# Patient Record
Sex: Female | Born: 1960
Health system: Southern US, Community
[De-identification: ages and names within clinical notes are randomized; demographics above are authoritative.]

## PROBLEM LIST (undated history)

## (undated) DIAGNOSIS — Z720 Tobacco use: Secondary | ICD-10-CM

## (undated) DIAGNOSIS — T7840XA Allergy, unspecified, initial encounter: Secondary | ICD-10-CM

## (undated) DIAGNOSIS — G459 Transient cerebral ischemic attack, unspecified: Secondary | ICD-10-CM

## (undated) DIAGNOSIS — E785 Hyperlipidemia, unspecified: Secondary | ICD-10-CM

## (undated) DIAGNOSIS — M1990 Unspecified osteoarthritis, unspecified site: Secondary | ICD-10-CM

## (undated) DIAGNOSIS — I150 Renovascular hypertension: Secondary | ICD-10-CM

## (undated) DIAGNOSIS — J219 Acute bronchiolitis, unspecified: Secondary | ICD-10-CM

## (undated) DIAGNOSIS — I701 Atherosclerosis of renal artery: Secondary | ICD-10-CM

## (undated) HISTORY — DX: Hyperlipidemia, unspecified: E78.5

## (undated) HISTORY — PX: OTHER SURGICAL HISTORY: SHX169

## (undated) HISTORY — DX: Unspecified osteoarthritis, unspecified site: M19.90

## (undated) HISTORY — DX: Atherosclerosis of renal artery: I70.1

## (undated) HISTORY — DX: Acute bronchiolitis, unspecified: J21.9

## (undated) HISTORY — DX: Tobacco use: Z72.0

## (undated) HISTORY — DX: Transient cerebral ischemic attack, unspecified: G45.9

## (undated) HISTORY — PX: COLONOSCOPY: SHX174

## (undated) HISTORY — PX: SHOULDER SURGERY: SHX246

## (undated) HISTORY — PX: CARPAL TUNNEL RELEASE: SHX101

## (undated) HISTORY — PX: HAND SURGERY: SHX662

## (undated) HISTORY — PX: KNEE SURGERY: SHX244

## (undated) HISTORY — DX: Allergy, unspecified, initial encounter: T78.40XA

## (undated) HISTORY — DX: Renovascular hypertension: I15.0

## (undated) HISTORY — PX: FL INJ LEFT KNEE CT ARTHROGRAM (ARMC HX): HXRAD1307

## (undated) HISTORY — PX: POLYPECTOMY: SHX149

---

## 1998-07-25 ENCOUNTER — Ambulatory Visit (HOSPITAL_BASED_OUTPATIENT_CLINIC_OR_DEPARTMENT_OTHER): Admission: RE | Admit: 1998-07-25 | Discharge: 1998-07-25 | Payer: Self-pay | Admitting: Orthopedic Surgery

## 1998-09-03 ENCOUNTER — Other Ambulatory Visit: Admission: RE | Admit: 1998-09-03 | Discharge: 1998-09-03 | Payer: Self-pay | Admitting: Obstetrics & Gynecology

## 1999-12-12 ENCOUNTER — Other Ambulatory Visit: Admission: RE | Admit: 1999-12-12 | Discharge: 1999-12-12 | Payer: Self-pay | Admitting: Obstetrics & Gynecology

## 2000-12-13 ENCOUNTER — Other Ambulatory Visit: Admission: RE | Admit: 2000-12-13 | Discharge: 2000-12-13 | Payer: Self-pay | Admitting: Obstetrics & Gynecology

## 2002-01-10 ENCOUNTER — Other Ambulatory Visit: Admission: RE | Admit: 2002-01-10 | Discharge: 2002-01-10 | Payer: Self-pay | Admitting: Obstetrics & Gynecology

## 2002-01-28 ENCOUNTER — Emergency Department (HOSPITAL_COMMUNITY): Admission: EM | Admit: 2002-01-28 | Discharge: 2002-01-28 | Payer: Self-pay | Admitting: *Deleted

## 2002-01-28 ENCOUNTER — Encounter: Payer: Self-pay | Admitting: Emergency Medicine

## 2003-02-05 ENCOUNTER — Other Ambulatory Visit: Admission: RE | Admit: 2003-02-05 | Discharge: 2003-02-05 | Payer: Self-pay | Admitting: Obstetrics & Gynecology

## 2004-03-25 ENCOUNTER — Other Ambulatory Visit: Admission: RE | Admit: 2004-03-25 | Discharge: 2004-03-25 | Payer: Self-pay | Admitting: Obstetrics & Gynecology

## 2004-05-13 ENCOUNTER — Encounter: Admission: RE | Admit: 2004-05-13 | Discharge: 2004-05-13 | Payer: Self-pay | Admitting: Internal Medicine

## 2004-10-31 ENCOUNTER — Encounter (INDEPENDENT_AMBULATORY_CARE_PROVIDER_SITE_OTHER): Payer: Self-pay | Admitting: *Deleted

## 2004-10-31 ENCOUNTER — Ambulatory Visit (HOSPITAL_COMMUNITY): Admission: RE | Admit: 2004-10-31 | Discharge: 2004-10-31 | Payer: Self-pay | Admitting: *Deleted

## 2004-10-31 ENCOUNTER — Encounter (INDEPENDENT_AMBULATORY_CARE_PROVIDER_SITE_OTHER): Payer: Self-pay | Admitting: Specialist

## 2005-04-08 ENCOUNTER — Other Ambulatory Visit: Admission: RE | Admit: 2005-04-08 | Discharge: 2005-04-08 | Payer: Self-pay | Admitting: Obstetrics & Gynecology

## 2005-06-19 ENCOUNTER — Encounter: Admission: RE | Admit: 2005-06-19 | Discharge: 2005-06-19 | Payer: Self-pay | Admitting: Internal Medicine

## 2007-09-02 ENCOUNTER — Ambulatory Visit (HOSPITAL_COMMUNITY): Admission: RE | Admit: 2007-09-02 | Discharge: 2007-09-02 | Payer: Self-pay | Admitting: *Deleted

## 2007-09-02 ENCOUNTER — Encounter (INDEPENDENT_AMBULATORY_CARE_PROVIDER_SITE_OTHER): Payer: Self-pay | Admitting: *Deleted

## 2008-12-04 ENCOUNTER — Encounter: Admission: RE | Admit: 2008-12-04 | Discharge: 2008-12-04 | Payer: Self-pay | Admitting: Internal Medicine

## 2009-11-04 ENCOUNTER — Encounter (INDEPENDENT_AMBULATORY_CARE_PROVIDER_SITE_OTHER): Payer: Self-pay | Admitting: *Deleted

## 2009-11-06 ENCOUNTER — Encounter: Admission: RE | Admit: 2009-11-06 | Discharge: 2009-11-06 | Payer: Self-pay | Admitting: Obstetrics & Gynecology

## 2009-12-23 ENCOUNTER — Ambulatory Visit: Payer: Self-pay | Admitting: Gastroenterology

## 2009-12-23 DIAGNOSIS — Z860100 Personal history of colon polyps, unspecified: Secondary | ICD-10-CM | POA: Insufficient documentation

## 2009-12-23 DIAGNOSIS — R198 Other specified symptoms and signs involving the digestive system and abdomen: Secondary | ICD-10-CM

## 2009-12-30 ENCOUNTER — Ambulatory Visit: Payer: Self-pay | Admitting: Gastroenterology

## 2010-01-02 ENCOUNTER — Encounter: Payer: Self-pay | Admitting: Gastroenterology

## 2010-04-13 ENCOUNTER — Encounter: Payer: Self-pay | Admitting: Obstetrics & Gynecology

## 2010-04-22 NOTE — Letter (Signed)
Summary: Results Letter  [facility]  [address] [place], [place] [address]   Phone: [phone]  Fax: [phone]        December 23, 2009 MRN: [id number]    ANNIS LAGOY [address] [place], [place]  [address]    Dear Ms. Mejorado,  It is my pleasure to have treated you recently as a new patient in my office. I appreciate your confidence and the opportunity to participate in your care.  Since I do have a busy inpatient endoscopy schedule and office schedule, my office hours vary weekly. I am, however, available for emergency calls everyday through my office. If I am not available for an urgent office appointment, another one of our gastroenterologist will be able to assist you.  My well-trained staff are prepared to help you at all times. For emergencies after office hours, a physician from our Gastroenterology section is always available through my 24 hour answering service  Once again I welcome you as a new patient and I look forward to a happy and healthy relationship             Sincerely,  Louis Meckel MD  This letter has been electronically signed by your physician.  Appended Document: Results Letter letter mailed

## 2010-04-22 NOTE — Letter (Signed)
Summary: [facility] Instructions  [facility]  [address] [name], [place] [address]   Phone: [phone]  Fax: [phone]       Sheri Perkins    [birth year]    MRN: [id number]        Procedure Day /Date:MONDAY 12/30/2009     Arrival Time:12:30PM     Procedure Time:1:30PM     Location of Procedure:                    X   [facility] (4th Floor)   PREPARATION FOR COLONOSCOPY WITH MOVIPREP   Starting 5 days prior to your procedure 10/5/2011do not eat nuts, seeds, popcorn, corn, beans, peas,  salads, or any raw vegetables.  Do not take any fiber supplements (e.g. Metamucil, Citrucel, and Benefiber).  THE DAY BEFORE YOUR PROCEDURE         DATE: 12/29/2009 DAY: SUNDAY  1.  Drink clear liquids the entire day-NO SOLID FOOD  2.  Do not drink anything colored red or purple.  Avoid juices with pulp.  No orange juice.  3.  Drink at least 64 oz. (8 glasses) of fluid/clear liquids during the day to prevent dehydration and help the prep work efficiently.  CLEAR LIQUIDS INCLUDE: Water Jello Ice Popsicles Tea (sugar ok, no milk/cream) Powdered fruit flavored drinks Coffee (sugar ok, no milk/cream) Gatorade Juice: apple, white grape, white cranberry  Lemonade Clear bullion, consomm, broth Carbonated beverages (any kind) Strained chicken noodle soup Hard Candy                             4.  In the morning, mix first dose of MoviPrep solution:    Empty 1 Pouch A and 1 Pouch B into the disposable container    Add lukewarm drinking water to the top line of the container. Mix to dissolve    Refrigerate (mixed solution should be used within 24 hrs)  5.  Begin drinking the prep at 5:00 p.m. The MoviPrep container is divided by 4 marks.   Every 15 minutes drink the solution down to the next mark (approximately 8 oz) until the full liter is complete.   6.  Follow completed prep with 16 oz of clear liquid of your choice (Nothing red or purple).   Continue to drink clear liquids until bedtime.  7.  Before going to bed, mix second dose of MoviPrep solution:    Empty 1 Pouch A and 1 Pouch B into the disposable container    Add lukewarm drinking water to the top line of the container. Mix to dissolve    Refrigerate  THE DAY OF YOUR PROCEDURE      DATE: 12/30/2009 DAY: MONDAY  Beginning at 8:30AMa.m. (5 hours before procedure):         1. Every 15 minutes, drink the solution down to the next mark (approx 8 oz) until the full liter is complete.  2. Follow completed prep with 16 oz. of clear liquid of your choice.    3. You may drink clear liquids until 11:30AM (2 HOURS BEFORE PROCEDURE).   MEDICATION INSTRUCTIONS  Unless otherwise instructed, you should take regular prescription medications with a small sip of water   as early as possible the morning of your procedure.          OTHER INSTRUCTIONS  You will need a responsible adult at least 52 years of age to accompany you and  drive you home.   This person must remain in the waiting room during your procedure.  Wear loose fitting clothing that is easily removed.  Leave jewelry and other valuables at home.  However, you may wish to bring a book to read or  an iPod/MP3 player to listen to music as you wait for your procedure to start.  Remove all body piercing jewelry and leave at home.  Total time from sign-in until discharge is approximately 2-3 hours.  You should go home directly after your procedure and rest.  You can resume normal activities the  day after your procedure.  The day of your procedure you should not:   Drive   Make legal decisions   Operate machinery   Drink alcohol   Return to work  You will receive specific instructions about eating, activities and medications before you leave.    The above instructions have been reviewed and explained to me by   _______________________    I fully understand and can verbalize these instructions  _____________________________ Date _________

## 2010-04-22 NOTE — Procedures (Signed)
Summary: Colonoscopy  Patient: Sheri Perkins Note: All result statuses are Final unless otherwise noted.  Tests: (1) Colonoscopy (COL)   COL Colonoscopy           DONE     [facility]     [address]     [place], [place]  [address]           COLONOSCOPY PROCEDURE REPORT           PATIENT:  Sheri, Perkins  MR#:  [id number]     BIRTHDATE:  March 03, 1961, 48 yrs. old  GENDER:  female           ENDOSCOPIST:  Barbette Hair. Arlyce Dice, MD     Referred by:           PROCEDURE DATE:  12/30/2009     PROCEDURE:  Colonoscopy with biopsy, Colonoscopy with snare     polypectomy     ASA CLASS:  Class II     INDICATIONS:  1) change in bowel habits  2) history of     pre-cancerous (adenomatous) colon polyps Last colo 3 years ago           MEDICATIONS:   Fentanyl 50 mcg IV, Versed 7 mg IV           DESCRIPTION OF PROCEDURE:   After the risks benefits and     alternatives of the procedure were thoroughly explained, informed     consent was obtained.  Digital rectal exam was performed and     revealed no abnormalities.   The LB [id number]-[id number] endoscope     was introduced through the anus and advanced to the cecum, which     was identified by both the appendix and ileocecal valve, without     limitations.  The quality of the prep was good, using MiraLax.     The instrument was then slowly withdrawn as the colon was fully     examined.     <<PROCEDUREIMAGES>>           FINDINGS:  A sessile polyp was found in the ascending colon. It     was 3 - 4 mm in size. Polyp was snared without cautery. Retrieval     was successful (see image5). snare polyp  A sessile polyp was     found in the sigmoid colon. It was 3 mm in size. It was found 30     cm from the point of entry. Polyp was snared without cautery.     Retrieval was successful (see image10). snare polyp  Possible     colitis was found in the rectum and sigmoid colon. Multiple areas     of submucosal hemorrhage, areas of mild erythema  (questionable     prep effect vs colitis). Bxs taken (see image13, image15, image16,     and image17).   Retroflexed views in the rectum revealed no     abnormalities.    The time to cecum =  3.75  minutes. The scope     was then withdrawn (time =  9.75  min) from the patient and the     procedure completed.           COMPLICATIONS:  None           ENDOSCOPIC IMPRESSION:     1) 3 - 4 mm sessile polyp in the ascending colon     2) 3 mm sessile polyp in the sigmoid colon  3) Possible Colitis in the rectum and sigmoid colon     RECOMMENDATIONS:     1) Await biopsy results     2) call office next 1-3 days to schedule followup visit in in     2-3 weeks           REPEAT EXAM:   You will receive a letter from Dr. Arlyce Dice in 1-2     weeks, after reviewing the final pathology, with followup     recommendations.           ______________________________     Barbette Hair Arlyce Dice, MD           CC: Juline Patch, MD           n.     Rosalie Doctor:   Barbette Hair. Sireen Halk at 12/30/2009 02:57 PM           Tyer, Aggie Cosier, [id number]  Note: An exclamation mark (!) indicates a result that was not dispersed into the flowsheet. Document Creation Date: 12/30/2009 2:58 PM _______________________________________________________________________  (1) Order result status: Final Collection or observation date-time: 12/30/2009 14:44 Requested date-time:  Receipt date-time:  Reported date-time:  Referring Physician:   Ordering Physician: Melvia Heaps [phone]) Specimen Source:  Source: Launa Grill Order Number: [id number] Lab site:   Appended Document: Colonoscopy     Procedures Next Due Date:    Colonoscopy: 12/2012

## 2010-04-22 NOTE — Letter (Signed)
Summary: New Patient letter  [facility]  [address] [place], [place] [address]   Phone: [phone]  Fax: [phone]       11/04/2009 MRN: [id number]  Sheri Perkins [address] [place], [place]  [address]  Dear Sheri Perkins,  Welcome to the Gastroenterology Division at [facility]    You are scheduled to see Dr.  Arlyce Dice on 12-23-09 at 10:00a.m. on the 3rd floor at [facility], [address]  We ask that you try to arrive at our office 15 minutes prior to your appointment time to allow for check-in.  We would like you to complete the enclosed self-administered evaluation form prior to your visit and bring it with you on the day of your appointment.  We will review it with you.  Also, please bring a complete list of all your medications or, if you prefer, bring the medication bottles and we will list them.  Please bring your insurance card so that we may make a copy of it.  If your insurance requires a referral to see a specialist, please bring your referral form from your primary care physician.  Co-payments are due at the time of your visit and may be paid by cash, check or credit card.     Your office visit will consist of a consult with your physician (includes a physical exam), any laboratory testing he/she may order, scheduling of any necessary diagnostic testing (e.g. x-ray, ultrasound, CT-scan), and scheduling of a procedure (e.g. Endoscopy, Colonoscopy) if required.  Please allow enough time on your schedule to allow for any/all of these possibilities.    If you cannot keep your appointment, please call [phone] to cancel or reschedule prior to your appointment date.  This allows [place] the opportunity to schedule an appointment for another patient in need of care.  If you do not cancel or reschedule by 5 p.m. the business day prior to your appointment date, you will be charged a $50.00 late cancellation/no-show fee.    Thank you for choosing  [facility] for your medical needs.  We appreciate the opportunity to care for you.  Please visit [place] at our website  to learn more about our practice.                     Sincerely,                                                             The Gastroenterology Division

## 2010-04-22 NOTE — Letter (Signed)
Summary: Patient Notice- Polyp Results  [facility]  [address] [place], [place] [address]   Phone: [phone]  Fax: [phone]        January 02, 2010 MRN: [id number]    Sheri Perkins [address] [place], [place]  [address]    Dear Ms. Boyar,  I am pleased to inform you that the colon polyp(s) removed during your recent colonoscopy was (were) found to be benign (no cancer detected) upon pathologic examination.  I recommend you have a repeat colonoscopy examination in 3_ years to look for recurrent polyps, as having colon polyps increases your risk for having recurrent polyps or even colon cancer in the future.  Should you develop new or worsening symptoms of abdominal pain, bowel habit changes or bleeding from the rectum or bowels, please schedule an evaluation with either your primary care physician or with me.  Additional information/recommendations:  __ No further action with gastroenterology is needed at this time. Please      follow-up with your primary care physician for your other healthcare      needs.  __ Please call [phone] to schedule a return visit to review your      situation.  __ Please keep your follow-up visit as already scheduled.  _x_ Continue treatment plan as outlined the day of your exam.  Please call us if you are having persistent problems or have questions about your condition that have not been fully answered at this time.  Sincerely,  Louis Meckel MD  This letter has been electronically signed by your physician.  Appended Document: Patient Notice- Polyp Results letter mailed

## 2010-04-22 NOTE — Op Note (Signed)
Summary: EGD Dr Virginia Rochester  NAME:  Sheri Perkins, Sheri Perkins              ACCOUNT NO.:  [id number]   MEDICAL RECORD NO.:  [id number]          PATIENT TYPE:  AMB   LOCATION:  ENDO                         FACILITY:  [facility]   PHYSICIAN:  Georgiana Spinner, M.D.    DATE OF BIRTH:  1961-02-16   DATE OF PROCEDURE:  10/31/2004  DATE OF DISCHARGE:                                 OPERATIVE REPORT   PROCEDURES:  Upper endoscopy with dilation and biopsy.   INDICATIONS:  Dysphagia.   ANESTHESIA:  Demerol 50, Versed 6 mg.   DESCRIPTION OF PROCEDURE:  With the patient mildly sedated in the left  lateral decubitus position, the Olympus videoscopic endoscope was inserted  in the mouth and passed under direct vision into the esophagus which  appeared normal. The endoscope was then advanced into the stomach, fundus,  body, antrum, duodenal bulb, second portion of duodenum were visualized.  From this point, the endoscope was slowly withdrawn taking circumferential  views of the duodenal mucosa until the endoscope had been pulled back into  the stomach placed in retroflexion to view the stomach from below. The  endoscope then straightened and a guidewire was passed. The endoscope was  withdrawn. Subsequently, Savary dilators 16 and 17 were passed over the  guidewire was minimal resistance. There was no blood seen on either of the  dilators. With the removal the 17 Savary dilator, the guidewire was removed.  The endoscope was reinserted at which point the distal esophagus mucosa  looked more prominent than previously, so I biopsied the squamocolumnar  junction to rule out Barrett's. The endoscope was withdrawn. The patient's  vital signs and pulse oximeter remained stable. The patient tolerated the  procedure well without apparent complication.   FINDINGS:  Question of Barrett's esophagus with biopsies taken and dilation  to 16 and 17 Savary. Await clinical response and biopsy report. The patient  will call me for results  and follow-up with me as an outpatient       GMO/MEDQ  D:  10/31/2004  T:  10/31/2004  Job:  [id number]

## 2010-04-22 NOTE — Assessment & Plan Note (Signed)
Summary: diarrhea//changes in BM--ch.   History of Present Illness Visit Type: Initial Visit Primary GI MD: Melvia Heaps MD [facility] Primary Provider: Juline Patch, MD Chief Complaint: Fecal Incontinence x 1 year. Pt states she has a lot of stool leakage after she passes gas and somtimes without her knowledge. Pt states she was told it was due to her eating icecream but she states she changed her diet without any improvement.  History of Present Illness:   Sheri Perkins is a pleasant 50 year old white female referred at the request of Dr. Orlin Hilding for evaluation of change in bowel habits.  Over the past year she has noted a change characterized by passage of small-volume stools when she urinates.  When she has passed gas she has soiled herself.  She is without abdominal or rectal pain.  She denies hematochezia.  There has been no change in her diet or medications.  She has a history of a adenomatous cecal polyp removed 3 years ago.   GI Review of Systems      Denies abdominal pain, acid reflux, belching, bloating, chest pain, dysphagia with liquids, dysphagia with solids, heartburn, loss of appetite, nausea, vomiting, vomiting blood, weight loss, and  weight gain.      Reports change in bowel habits and  fecal incontinence.     Denies anal fissure, black tarry stools, constipation, diarrhea, diverticulosis, heme positive stool, hemorrhoids, irritable bowel syndrome, jaundice, light color stool, liver problems, rectal bleeding, and  rectal pain. Preventive Screening-Counseling & Management  Alcohol-Tobacco     Smoking Status: current      Drug Use:  no.      Current Medications (verified): 1)  Vitamin D3 2000 Unit Caps (Cholecalciferol) .... One Capsule By Mouth Once Daily 2)  Garlic 1000 Mg Caps (Garlic) .... One Capsule By Mouth Once Daily 3)  Tri-Iodine 12.5 Mg .... One Tablet By Mouth Once Daily  Allergies (verified): 1)  ! Sulfa  Past History:  Past Medical History: Tubular  adenoma polyps Arthritis ? Thyroid disease  Past Surgical History: Bilateral Carpal Tunnel Release Left Knee surgery Bilateral arm surgery  Family History: No FH of Colon Cancer: Sister: Melanoma  Social History: Married Patient currently smokes.  Alcohol Use - no Daily Caffeine Use Illicit Drug Use - no Smoking Status:  current Drug Use:  no  Review of Systems       The patient complains of allergy/sinus, arthritis/joint pain, back pain, headaches-new, thirst - excessive, and urination - excessive.  The patient denies anemia, anxiety-new, blood in urine, breast changes/lumps, change in vision, confusion, cough, coughing up blood, depression-new, fainting, fatigue, fever, hearing problems, heart murmur, heart rhythm changes, itching, menstrual pain, muscle pains/cramps, night sweats, nosebleeds, pregnancy symptoms, shortness of breath, skin rash, sleeping problems, sore throat, swelling of feet/legs, swollen lymph glands, thirst - excessive , urination - excessive , urination changes/pain, urine leakage, vision changes, and voice change.         All other systems were reviewed and were negative   Vital Signs:  Patient profile:   50 year old female Height:      60 inches Weight:      146.13 pounds BMI:     28.64 Pulse rate:   90 / minute Pulse rhythm:   regular BP sitting:   138 / 88  (right arm) Cuff size:   regular  Vitals Entered By: Christie Nottingham CMA Duncan Dull) (December 23, 2009 9:43 AM)  Physical Exam  Additional Exam:  On physical exam  she is a well-developed well-nourished female  skin: anicter  HEENT: normocephalic; PEERLA; no nasal or orpharyngeal abnormalities neck: supple nodes: no cervical adenopathy chest: clear cor:  no murmurs, gallops or rubs abd:  bowel sounds normoactive; no abdominal masses, tenderness, organomegaly rectal: no masses; stool heme negative ext: no cyanosis, clubbing, or edema skeletal: no gross skeletal abnormalities neuro: alert,  oriented x 3; no focal abnormalities    Impression & Recommendations:  Problem # 1:  CHANGE IN BOWELS (ICD-787.99)  Etiology is not apparent.  There were no abnormalities on her rectal exam.  A structural abnormality of the colon should be ruled out.  Recommendations #1 colonoscopy #2 if colonoscopy is negative I will institute a bowel regimen  Risks, alternatives, and complications of the procedure, including bleeding, perforation, and possible need for surgery, were explained to the patient.  Patient's questions were answered.  Orders: Colonoscopy (Colon)  Problem # 2:  PERSONAL HISTORY OF COLONIC POLYPS (ICD-V12.72)  Orders: Colonoscopy (Colon)  Patient Instructions: 1)  Copy sent to : Juline Patch, MD 2)  Your colonoscopy is scheduled for 12/30/2009 at 1:30pm 3)  Colonoscopy and Flexible Sigmoidoscopy brochure given.  4)  Conscious Sedation brochure given.  5)  The medication list was reviewed and reconciled.  All changed / newly prescribed medications were explained.  A complete medication list was provided to the patient / caregiver. Prescriptions: MOVIPREP 100 GM  SOLR (PEG-KCL-NACL-NASULF-NA ASC-C) As per prep instructions.  #1 x 0   Entered by:   Merri Ray CMA (AAMA)   Authorized by:   Louis Meckel MD   Signed by:   Merri Ray CMA (AAMA) on 12/23/2009   Method used:   Electronically to        [facility] [id number] (ret[facility])       [address]       [place], [place]  [address]       Ph: [phone] or [phone]       Fax: [phone]   RxID:   [id number]

## 2010-04-22 NOTE — Op Note (Signed)
Summary: Colonoscopy (Dr Virginia Rochester)  NAME:  Sheri Perkins, Sheri Perkins              ACCOUNT NO.:  [id number]      MEDICAL RECORD NO.:  [id number]          PATIENT TYPE:  AMB      LOCATION:  ENDO                         FACILITY:  [facility]      PHYSICIAN:  Georgiana Spinner, M.D.    DATE OF BIRTH:  1960/05/08      DATE OF PROCEDURE:  09/02/2007   DATE OF DISCHARGE:                                  OPERATIVE REPORT      PROCEDURE:  Colonoscopy with biopsy.      INDICATION:  Rectal bleeding.      ANESTHESIA:  Fentanyl 100 mcg, Versed 7 mg.      PROCEDURE IN DETAIL:  With the patient mildly sedated in the left   lateral decubitus position the Pentax videoscopic colonoscope was   inserted in the rectum, passed under direct vision to the cecum,   identified by ileocecal valve and appendiceal orifice.  In the cecum was   a polyp that was photographed and removed using hot biopsy forceps   technique setting of 20/150 blended current.  From this point the   colonoscope was slowly withdrawn taking circumferential views of colonic   mucosa.  The prep was suboptimal in that there were areas of liquid   brown stool that had to be suctioned as best we could and in areas that   just coated the mucosa so we could see small lesions but certainly no   gross lesions were noted.  As we withdrew all the way to the rectum   which showed another polyp which was photographed and removed using hot   biopsy forceps technique with the same setting and showed hemorrhoids on   retroflexed view.  The endoscope was straightened and withdrawn.  The   patient's vital signs, pulse oximeter remained stable.  The patient   tolerated the procedure well without apparent complications.      FINDINGS:  Two small polyps, one in the rectum and one in the cecum both   removed, a slightly suboptimal prep in that the patient did not take the   whole prep as recommended.                  ______________________________   Georgiana Spinner, M.D.           GMO/MEDQ  D:  09/02/2007  T:  09/02/2007  Job:  [id number]

## 2010-04-22 NOTE — Op Note (Signed)
Summary: Colonoscopy Pathology (Dr Virginia Rochester)  SP-Surgical Pathology - STATUS: Final  .                                         Perform Date: 12Jun09 00:01  Ordered By: Jarvis Newcomer,            Ordered Date: 12Jun09 10:07  Facility: [facility]                              Department: CPATH  Service Report Text  [facility]   [address] [place], [place] [address]   [phone]    REPORT OF SURGICAL PATHOLOGY    Case #: [id number]   Patient Name: Sheri Perkins, Sheri Perkins.   Office Chart Number: N/A    MRN: [id number]   Pathologist: Beulah Gandy. Luisa Hart, MD   DOB/Age Nov 08, 1960 (Age: 14) Gender: F   Date Taken: 09/02/2007   Date Received: 09/02/2007    FINAL DIAGNOSIS    ***MICROSCOPIC EXAMINATION AND DIAGNOSIS***    1. COLON, CECAL POLYP: TUBULAR ADENOMA. NO HIGH GRADE   DYSPLASIA OR MALIGNANCY IDENTIFIED.    2. COLON, RECTAL POLYP: HYPERPLASTIC POLYP. NO ADENOMATOUS   CHANGE OR MALIGNANCY IDENTIFIED.    mw   Date Reported: 09/05/2007 Beulah Gandy. Luisa Hart, MD   *** Electronically Signed Out By JDP ***    Clinical information   (as)    specimen(s) obtained   1: Colon, polyp(s), cecal   2: Rectum, polyp(s)    Gross Description   1. Received in formalin is a tan, soft tissue fragment that is   submitted in toto. Size: 0.3 cm    2. Received in formalin is a tan, soft tissue fragment that is   submitted in toto. Size: 0.3 cm (GRP:kv 09-02-07)    kv/   Additional Information  HL7 RESULT STATUS : F  External IF Update Timestamp : 2007-09-02:10:08:00.000000

## 2010-06-16 HISTORY — PX: US ECHOCARDIOGRAPHY: HXRAD669

## 2010-08-05 NOTE — Op Note (Signed)
Sheri Perkins, CHOU              ACCOUNT NO.:  [id number]   MEDICAL RECORD NO.:  [id number]          PATIENT TYPE:  AMB   LOCATION:  ENDO                         FACILITY:  [facility]   PHYSICIAN:  Georgiana Spinner, M.D.    DATE OF BIRTH:  Dec 01, 1960   DATE OF PROCEDURE:  09/02/2007  DATE OF DISCHARGE:                               OPERATIVE REPORT   PROCEDURE:  Colonoscopy with biopsy.   INDICATION:  Rectal bleeding.   ANESTHESIA:  Fentanyl 100 mcg, Versed 7 mg.   PROCEDURE IN DETAIL:  With the patient mildly sedated in the left  lateral decubitus position the Pentax videoscopic colonoscope was  inserted in the rectum, passed under direct vision to the cecum,  identified by ileocecal valve and appendiceal orifice.  In the cecum was  a polyp that was photographed and removed using hot biopsy forceps  technique setting of 20/150 blended current.  From this point the  colonoscope was slowly withdrawn taking circumferential views of colonic  mucosa.  The prep was suboptimal in that there were areas of liquid  brown stool that had to be suctioned as best we could and in areas that  just coated the mucosa so we could see small lesions but certainly no  gross lesions were noted.  As we withdrew all the way to the rectum  which showed another polyp which was photographed and removed using hot  biopsy forceps technique with the same setting and showed hemorrhoids on  retroflexed view.  The endoscope was straightened and withdrawn.  The  patient's vital signs, pulse oximeter remained stable.  The patient  tolerated the procedure well without apparent complications.   FINDINGS:  Two small polyps, one in the rectum and one in the cecum both  removed, a slightly suboptimal prep in that the patient did not take the  whole prep as recommended.           ______________________________  Georgiana Spinner, M.D.     GMO/MEDQ  D:  09/02/2007  T:  09/02/2007  Job:  [id number]

## 2010-08-08 NOTE — Op Note (Signed)
Sheri Perkins, Sheri Perkins              ACCOUNT NO.:  [id number]   MEDICAL RECORD NO.:  [id number]          PATIENT TYPE:  AMB   LOCATION:  ENDO                         FACILITY:  [facility]   PHYSICIAN:  Georgiana Spinner, M.D.    DATE OF BIRTH:  1960/10/04   DATE OF PROCEDURE:  10/31/2004  DATE OF DISCHARGE:                                 OPERATIVE REPORT   PROCEDURES:  Upper endoscopy with dilation and biopsy.   INDICATIONS:  Dysphagia.   ANESTHESIA:  Demerol 50, Versed 6 mg.   DESCRIPTION OF PROCEDURE:  With the patient mildly sedated in the left  lateral decubitus position, the Olympus videoscopic endoscope was inserted  in the mouth and passed under direct vision into the esophagus which  appeared normal. The endoscope was then advanced into the stomach, fundus,  body, antrum, duodenal bulb, second portion of duodenum were visualized.  From this point, the endoscope was slowly withdrawn taking circumferential  views of the duodenal mucosa until the endoscope had been pulled back into  the stomach placed in retroflexion to view the stomach from below. The  endoscope then straightened and a guidewire was passed. The endoscope was  withdrawn. Subsequently, Savary dilators 16 and 17 were passed over the  guidewire was minimal resistance. There was no blood seen on either of the  dilators. With the removal the 17 Savary dilator, the guidewire was removed.  The endoscope was reinserted at which point the distal esophagus mucosa  looked more prominent than previously, so I biopsied the squamocolumnar  junction to rule out Barrett's. The endoscope was withdrawn. The patient's  vital signs and pulse oximeter remained stable. The patient tolerated the  procedure well without apparent complication.   FINDINGS:  Question of Barrett's esophagus with biopsies taken and dilation  to 16 and 17 Savary. Await clinical response and biopsy report. The patient  will call me for results and follow-up with me  as an outpatient       GMO/MEDQ  D:  10/31/2004  T:  10/31/2004  Job:  [id number]

## 2011-04-03 DIAGNOSIS — I701 Atherosclerosis of renal artery: Secondary | ICD-10-CM

## 2011-04-03 HISTORY — DX: Atherosclerosis of renal artery: I70.1

## 2011-10-07 ENCOUNTER — Ambulatory Visit (INDEPENDENT_AMBULATORY_CARE_PROVIDER_SITE_OTHER): Payer: 59 | Admitting: Emergency Medicine

## 2011-10-07 ENCOUNTER — Other Ambulatory Visit: Payer: Self-pay | Admitting: Family Medicine

## 2011-10-07 ENCOUNTER — Ambulatory Visit: Payer: 59

## 2011-10-07 VITALS — BP 158/90 | HR 71 | Temp 97.9°F | Resp 18 | Ht 60.5 in | Wt 152.0 lb

## 2011-10-07 DIAGNOSIS — M79643 Pain in unspecified hand: Secondary | ICD-10-CM

## 2011-10-07 DIAGNOSIS — D72829 Elevated white blood cell count, unspecified: Secondary | ICD-10-CM

## 2011-10-07 DIAGNOSIS — M7989 Other specified soft tissue disorders: Secondary | ICD-10-CM

## 2011-10-07 DIAGNOSIS — M79609 Pain in unspecified limb: Secondary | ICD-10-CM

## 2011-10-07 LAB — POCT CBC
Granulocyte percent: 75.6 %G (ref 37–80)
HCT, POC: 42.6 % (ref 37.7–47.9)
Hemoglobin: 13.4 g/dL (ref 12.2–16.2)
MCV: 95.5 fL (ref 80–97)
RBC: 4.46 M/uL (ref 4.04–5.48)

## 2011-10-07 MED ORDER — MELOXICAM 7.5 MG PO TABS: ORAL_TABLET | ORAL | Status: DC

## 2011-10-07 NOTE — Progress Notes (Signed)
  Subjective:    Patient ID: Sheri Perkins, female    DOB: 12-16-1960, 51 y.o.   MRN: [id number]  HPI patient works second shift and enters with severe pain in the back of her left hand. She has a history of hypertension and is on medication for that she noticed some discomfort in the back of her hand prior to going to work yesterday and then was unable to complete her shift due to severe pain in her hand. She enters today with severe pain swelling and redness of the back of her left hand    Review of Systems     Objective:   Physical Exam physical exam reveals redness and swelling over the dorsum of the left hand. She has pain with extension of the second third and fourth fingers. She has some pain in her left wrist. Most of her pain seems to be localized to the hand itself .   Results for orders placed in visit on 10/07/11  POCT CBC      Component Value Range   WBC 11.5 (*) 4.6 - 10.2 K/uL   Lymph, poc 2.0  0.6 - 3.4   POC LYMPH PERCENT 17.7  10 - 50 %L   MID (cbc) 0.8  0 - 0.9   POC MID % 6.7  0 - 12 %M   POC Granulocyte 8.7 (*) 2 - 6.9   Granulocyte percent 75.6  37 - 80 %G   RBC 4.46  4.04 - 5.48 M/uL   Hemoglobin 13.4  12.2 - 16.2 g/dL   HCT, POC 40.9  81.1 - 47.9 %   MCV 95.5  80 - 97 fL   MCH, POC 30.0  27 - 31.2 pg   MCHC 31.5 (*) 31.8 - 35.4 g/dL   RDW, POC 91.4     Platelet Count, POC 277  142 - 424 K/uL   MPV 10.0  0 - 99.8 fL   [facility] reading (PRIMARY) by  Dr.Providence Stivers no fracture seen no bony abnormalities .     Assessment & Plan:  I'm worried that this is either a tendinitis or gout but her blood count is slightly elevated at 11.5 I placed her in a splint we'll recheck tomorrow to check on status I started her on anti-inflammatory to take twice a day

## 2011-10-08 ENCOUNTER — Other Ambulatory Visit: Payer: Self-pay | Admitting: Family Medicine

## 2011-10-08 ENCOUNTER — Ambulatory Visit (INDEPENDENT_AMBULATORY_CARE_PROVIDER_SITE_OTHER): Payer: 59 | Admitting: Emergency Medicine

## 2011-10-08 VITALS — BP 142/84 | HR 86 | Temp 97.8°F | Resp 16 | Ht 59.5 in | Wt 152.2 lb

## 2011-10-08 DIAGNOSIS — M7150 Other bursitis, not elsewhere classified, unspecified site: Secondary | ICD-10-CM

## 2011-10-08 DIAGNOSIS — M779 Enthesopathy, unspecified: Secondary | ICD-10-CM

## 2011-10-08 NOTE — Progress Notes (Signed)
  Subjective:    Patient ID: Sheri Perkins, female    DOB: 1960-05-09, 51 y.o.   MRN: [id number]  HPI  Pt following up for hand pain and edema.  Hand flexion and extension has improved, hand not as swollen. Still experiencing pain. Used ice packs and elevation last night, it helped. Will follow up with dr. Althea Charon.  Review of Systems     Objective:   Physical Exam        Assessment & Plan:

## 2011-10-08 NOTE — Progress Notes (Signed)
  Subjective:    Patient ID: Sheri Perkins, female    DOB: 08-Apr-1960, 51 y.o.   MRN: [id number]  HPI patient in to recheck her tendinitis of the left hand . She was placed in a splint and on nonsteroidal anti-inflammatory drugs it is significantly better today    Review of Systems     Objective:   Physical Exam uric acid returned normal exam today reveals redness has diminished. There is more diffuse swelling over the dorsum of the hand. There is better flexion-extension of the fingers to        Assessment & Plan:  I suspect she had an overuse tendinitis involving the extensor tendons of the hand. She will continue in the splint continue her anti-inflammatory drugs and followup with Dr. Althea Charon tomorrow .

## 2011-11-20 ENCOUNTER — Other Ambulatory Visit: Payer: Self-pay | Admitting: Obstetrics & Gynecology

## 2012-07-20 ENCOUNTER — Encounter: Payer: Self-pay | Admitting: *Deleted

## 2012-07-21 ENCOUNTER — Encounter: Payer: Self-pay | Admitting: Cardiovascular Disease

## 2012-08-12 ENCOUNTER — Ambulatory Visit (INDEPENDENT_AMBULATORY_CARE_PROVIDER_SITE_OTHER): Payer: 59 | Admitting: Cardiovascular Disease

## 2012-08-12 ENCOUNTER — Encounter: Payer: Self-pay | Admitting: Cardiovascular Disease

## 2012-08-12 VITALS — BP 122/80 | HR 81 | Resp 16 | Ht 60.0 in | Wt 131.3 lb

## 2012-08-12 DIAGNOSIS — Z720 Tobacco use: Secondary | ICD-10-CM | POA: Insufficient documentation

## 2012-08-12 DIAGNOSIS — I10 Essential (primary) hypertension: Secondary | ICD-10-CM

## 2012-08-12 DIAGNOSIS — E785 Hyperlipidemia, unspecified: Secondary | ICD-10-CM | POA: Insufficient documentation

## 2012-08-12 DIAGNOSIS — Z79899 Other long term (current) drug therapy: Secondary | ICD-10-CM

## 2012-08-12 DIAGNOSIS — I150 Renovascular hypertension: Secondary | ICD-10-CM

## 2012-08-12 DIAGNOSIS — F17200 Nicotine dependence, unspecified, uncomplicated: Secondary | ICD-10-CM

## 2012-08-12 NOTE — Assessment & Plan Note (Signed)
She does express commitments to eventually quit smoking, but is not ready yet. She wants to lose a total of 20 pounds because she knows that when she stops smoking she'll gain a lot of weight back. This happened to her when she quit smoking last time. She does believe that she'll able to achieve smoking cessation before the end of the year. We discussed the different mechanisms to help her quit for over 10 minutes.

## 2012-08-12 NOTE — Assessment & Plan Note (Signed)
This is well controlled on a relatively low dose of angiotensin receptor blocker. She has normal renal function. Percutaneous intervention does not appear to be necessary at this time, but remains an option for the future.

## 2012-08-12 NOTE — Progress Notes (Signed)
Patient ID: Sheri Perkins, female   DOB: 12/22/1960, 52 y.o.   MRN: [id number]  HPI  Allergies  Allergen Reactions  . Macrobid (Nitrofurantoin Macrocrystal)   . Sulfa Antibiotics   . Sulfonamide Derivatives     Current Outpatient Prescriptions  Medication Sig Dispense Refill  . ALPRAZolam (XANAX) 0.5 MG tablet Take 0.5 mg by mouth 2 (two) times daily.      Marland Kitchen aspirin 81 MG tablet Take 81 mg by mouth daily.      . cholecalciferol (VITAMIN D) 1000 UNITS tablet Take 1,000 Units by mouth daily.      Marland Kitchen losartan (COZAAR) 25 MG tablet Take 25 mg by mouth 2 (two) times daily.      . meloxicam (MOBIC) 7.5 MG tablet Take 1-2 tablets daily with food for her hand pain  30 tablet  1  . estradiol (ESTRACE) 2 MG tablet Take 2 mg by mouth daily.      . progesterone (PROMETRIUM) 100 MG capsule Take 100 mg by mouth at bedtime. For 12 consecutive days       No current facility-administered medications for this visit.    Past Medical History  Diagnosis Date  . Renovascular hypertension   . Tobacco abuse   . Acute bronchiolitis   . Renal artery stenosis 04/03/2011    right 1-59% proximal reduction,left main 1-59% reduction,    Past Surgical History  Procedure Laterality Date  . Hand surgery    . Knee surgery    . Arm surgery    . US echocardiography  06/16/2010    normal    No family history on file.  History   Social History  . Marital Status: Married    Spouse Name: N/A    Number of Children: N/A  . Years of Education: N/A   Occupational History  . Not on file.   Social History Main Topics  . Smoking status: Current Every Day Smoker -- 1.50 packs/day    Types: Cigarettes  . Smokeless tobacco: Not on file  . Alcohol Use: No  . Drug Use: No  . Sexually Active: Not on file   Other Topics Concern  . Not on file   Social History Narrative  . No narrative on file    ROS:The patient specifically denies any chest pain at rest exertion, dyspnea at rest or with exertion,  orthopnea, paroxysmal nocturnal dyspnea, syncope, palpitations, focal neurological deficits, intermittent claudication, lower extremity edema, unexplained weight gain, cough, hemoptysis or wheezing.   PHYSICAL EXAM BP 122/80  Pulse 81  Resp 16  Ht 5' (1.524 m)  Wt 131 lb 4.8 oz (59.557 kg)  BMI 25.64 kg/m2  General: Alert, oriented x3, no distress Head: no evidence of trauma, PERRL, EOMI, no exophtalmos or lid lag, no myxedema, no xanthelasma; normal ears, nose and oropharynx Neck: normal jugular venous pulsations and no hepatojugular reflux; brisk carotid pulses without delay and no carotid bruits Chest: clear to auscultation, no signs of consolidation by percussion or palpation, normal fremitus, symmetrical and full respiratory excursions Cardiovascular: normal position and quality of the apical impulse, regular rhythm, normal first and second heart sounds, no murmurs, rubs or gallops Abdomen: no tenderness or distention, no masses by palpation, no abnormal pulsatility or arterial bruits, normal bowel sounds, no hepatosplenomegaly Extremities: no clubbing, cyanosis or edema; 2+ radial, ulnar and brachial pulses bilaterally; 2+ right femoral, posterior tibial and dorsalis pedis pulses; 2+ left femoral, posterior tibial and dorsalis pedis pulses; no subclavian or femoral bruits Neurological:  grossly nonfocal  EKG: Normal sinus rhythm, normal ECG  Lipid Panel  No results found for this basename: chol, trig, hdl, cholhdl, vldl, ldlcalc    BMET No results found for this basename: na, k, cl, co2, glucose, bun, creatinine, calcium, gfrnonaa, gfraa     ASSESSMENT AND PLAN  Renovascular hypertension This is well controlled on a relatively low dose of angiotensin receptor blocker. She has normal renal function. Percutaneous intervention does not appear to be necessary at this time, but remains an option for the future.  Tobacco abuse She does express commitments to eventually quit  smoking, but is not ready yet. She wants to lose a total of 20 pounds because she knows that when she stops smoking she'll gain a lot of weight back. This happened to her when she quit smoking last time. She does believe that she'll able to achieve smoking cessation before the end of the year. We discussed the different mechanisms to help her quit for over 10 minutes.  Hyperlipidemia When last checked (2 years ago) her LDL cholesterol was greater than 130. Especially since she has renal artery stenosis it would be important to bring her LDL down to less than 100. She's interested in achieving this without medications if possible, and since she is also much weight I believe we should recheck her lipid profile. Shows her to have breakfast today so she'll check it at another time, unless Dr. Ricki Miller as sort he ordered in the interim.     Sunjai Levandoski

## 2012-08-12 NOTE — Assessment & Plan Note (Signed)
When last checked (2 years ago) her LDL cholesterol was greater than 130. Especially since she has renal artery stenosis it would be important to bring her LDL down to less than 100. She's interested in achieving this without medications if possible, and since she is also much weight I believe we should recheck her lipid profile. Shows her to have breakfast today so she'll check it at another time, unless Dr. Ricki Miller as sort he ordered in the interim.

## 2012-08-12 NOTE — Patient Instructions (Signed)
Please check cholesterol labs  (unless Dr. Ricki Miller checks them also) and expect a call back a few days later.

## 2012-09-26 ENCOUNTER — Other Ambulatory Visit (HOSPITAL_COMMUNITY): Payer: Self-pay | Admitting: Cardiovascular Disease

## 2012-09-26 DIAGNOSIS — I150 Renovascular hypertension: Secondary | ICD-10-CM

## 2012-10-20 ENCOUNTER — Telehealth (HOSPITAL_COMMUNITY): Payer: Self-pay | Admitting: Cardiovascular Disease

## 2012-10-31 ENCOUNTER — Encounter: Payer: Self-pay | Admitting: Gastroenterology

## 2012-11-07 ENCOUNTER — Telehealth (HOSPITAL_COMMUNITY): Payer: Self-pay | Admitting: Cardiovascular Disease

## 2012-12-29 ENCOUNTER — Telehealth (HOSPITAL_COMMUNITY): Payer: Self-pay | Admitting: *Deleted

## 2013-01-16 ENCOUNTER — Encounter (HOSPITAL_COMMUNITY): Payer: 59

## 2013-01-26 ENCOUNTER — Encounter (HOSPITAL_COMMUNITY): Payer: 59

## 2013-02-03 ENCOUNTER — Ambulatory Visit (HOSPITAL_COMMUNITY)
Admission: RE | Admit: 2013-02-03 | Discharge: 2013-02-03 | Disposition: A | Discharge status: Home / Self Care | Payer: 59 | Source: Ambulatory Visit | Attending: Cardiovascular Disease | Admitting: Cardiovascular Disease

## 2013-02-03 DIAGNOSIS — I701 Atherosclerosis of renal artery: Secondary | ICD-10-CM | POA: Insufficient documentation

## 2013-02-03 DIAGNOSIS — I150 Renovascular hypertension: Secondary | ICD-10-CM

## 2013-02-03 NOTE — Progress Notes (Signed)
Renal Artery Duplex Completed. °Brianna L Mazza,RVT °

## 2013-02-08 ENCOUNTER — Telehealth: Payer: Self-pay | Admitting: *Deleted

## 2013-02-08 ENCOUNTER — Encounter: Payer: Self-pay | Admitting: *Deleted

## 2013-02-08 DIAGNOSIS — I701 Atherosclerosis of renal artery: Secondary | ICD-10-CM

## 2013-02-08 NOTE — Telephone Encounter (Signed)
Order placed for repeat renal dopplers in 1 year  

## 2013-02-08 NOTE — Telephone Encounter (Signed)
Message copied by Marella Bile on Wed Feb 08, 2013  3:25 PM ------      Message from: Runell Gess      Created: Wed Feb 08, 2013  6:57 AM       No change from prior study. Repeat in 12 months. ------

## 2013-04-19 ENCOUNTER — Other Ambulatory Visit: Payer: Self-pay | Admitting: Cardiovascular Disease

## 2013-04-19 NOTE — Telephone Encounter (Signed)
Rx was sent to pharmacy electronically. 

## 2013-06-13 ENCOUNTER — Encounter: Payer: Self-pay | Admitting: Gastroenterology

## 2013-08-04 ENCOUNTER — Other Ambulatory Visit: Payer: Self-pay | Admitting: Occupational Medicine

## 2013-08-04 ENCOUNTER — Ambulatory Visit: Payer: Self-pay

## 2013-08-04 DIAGNOSIS — M25512 Pain in left shoulder: Secondary | ICD-10-CM

## 2013-10-14 ENCOUNTER — Other Ambulatory Visit: Payer: Self-pay | Admitting: Cardiovascular Disease

## 2013-10-17 NOTE — Telephone Encounter (Signed)
Rx refill sent to patient pharmacy   

## 2013-12-18 ENCOUNTER — Ambulatory Visit (INDEPENDENT_AMBULATORY_CARE_PROVIDER_SITE_OTHER): Payer: 59 | Admitting: Cardiovascular Disease

## 2013-12-18 ENCOUNTER — Encounter: Payer: Self-pay | Admitting: Cardiovascular Disease

## 2013-12-18 VITALS — BP 116/76 | HR 92 | Resp 16 | Ht 60.0 in | Wt 156.4 lb

## 2013-12-18 DIAGNOSIS — I150 Renovascular hypertension: Secondary | ICD-10-CM

## 2013-12-18 DIAGNOSIS — F17200 Nicotine dependence, unspecified, uncomplicated: Secondary | ICD-10-CM

## 2013-12-18 DIAGNOSIS — E785 Hyperlipidemia, unspecified: Secondary | ICD-10-CM

## 2013-12-18 DIAGNOSIS — Z720 Tobacco use: Secondary | ICD-10-CM

## 2013-12-18 NOTE — Patient Instructions (Signed)
Dr. Croitoru recommends that you schedule a follow-up appointment in: One Year.   

## 2013-12-18 NOTE — Progress Notes (Signed)
Patient ID: Sheri Perkins, female   DOB: [birth year], 53 y.o.   MRN: [id number]      Reason for office visit Renovascular hypertension/renal artery stenosis  Sheri Perkins has done well from a cardiovascular standpoint in the last year. She's still out on Workmen's Comp for left shoulder problems. Physical therapy has helped, but completely relieved her pain. Unfortunately, in the meanwhile, her job has "moved to [place]" she will soon be unemployed.  She has a 60-99% stenosis in the mid and distal right renal artery, likely due to fibromuscular dysplasia.  She has made substantial progress with smoking cessation. She has cut back from 3 packs a day to only half a pack of cigarettes a week.   Allergies  Allergen Reactions  . Macrobid [Nitrofurantoin Macrocrystal]   . Sulfa Antibiotics   . Sulfonamide Derivatives     Current Outpatient Prescriptions  Medication Sig Dispense Refill  . ALPRAZolam (XANAX) 0.5 MG tablet Take 0.5 mg by mouth 2 (two) times daily.      Marland Kitchen aspirin 81 MG tablet Take 81 mg by mouth daily.      . cholecalciferol (VITAMIN D) 1000 UNITS tablet Take 1,000 Units by mouth daily.      . cyclobenzaprine (FLEXERIL) 10 MG tablet       . estradiol (ESTRACE) 2 MG tablet Take 2 mg by mouth daily.      Marland Kitchen losartan (COZAAR) 25 MG tablet TAKE 1 TABLET BY MOUTH TWICE A DAY  180 tablet  1  . meloxicam (MOBIC) 7.5 MG tablet Take 1-2 tablets daily with food for her hand pain  30 tablet  1  . progesterone (PROMETRIUM) 100 MG capsule Take 100 mg by mouth at bedtime. For 12 consecutive days       No current facility-administered medications for this visit.    Past Medical History  Diagnosis Date  . Renovascular hypertension   . Tobacco abuse   . Acute bronchiolitis   . Renal artery stenosis 04/03/2011    right 1-59% proximal reduction,left main 1-59% reduction,    Past Surgical History  Procedure Laterality Date  . Hand surgery    . Knee surgery    . Arm surgery    . US  echocardiography  06/16/2010    normal    No family history on file.  History   Social History  . Marital Status: Married    Spouse Name: N/A    Number of Children: N/A  . Years of Education: N/A   Occupational History  . Not on file.   Social History Main Topics  . Smoking status: Current Every Day Smoker -- 1.50 packs/day    Types: Cigarettes  . Smokeless tobacco: Not on file  . Alcohol Use: No  . Drug Use: No  . Sexual Activity: Not on file   Other Topics Concern  . Not on file   Social History Narrative  . No narrative on file    Review of systems: The patient specifically denies any chest pain at rest or with exertion, dyspnea at rest or with exertion, orthopnea, paroxysmal nocturnal dyspnea, syncope, palpitations, focal neurological deficits, intermittent claudication, lower extremity edema, unexplained weight gain, cough, hemoptysis or wheezing.  The patient also denies abdominal pain, nausea, vomiting, dysphagia, diarrhea, constipation, polyuria, polydipsia, dysuria, hematuria, frequency, urgency, abnormal bleeding or bruising, fever, chills, unexpected weight changes, mood swings, change in skin or hair texture, change in voice quality, auditory or visual problems, allergic reactions or rashes, new musculoskeletal complaints other  than usual "aches and pains".   PHYSICAL EXAM BP 116/76  Pulse 92  Resp 16  Ht 5' (1.524 m)  Wt 70.943 kg (156 lb 6.4 oz)  BMI 30.54 kg/m2  General: Alert, oriented x3, no distress Head: no evidence of trauma, PERRL, EOMI, no exophtalmos or lid lag, no myxedema, no xanthelasma; normal ears, nose and oropharynx Neck: normal jugular venous pulsations and no hepatojugular reflux; brisk carotid pulses without delay and no carotid bruits Chest: clear to auscultation, no signs of consolidation by percussion or palpation, normal fremitus, symmetrical and full respiratory excursions Cardiovascular: normal position and quality of the apical  impulse, regular rhythm, normal first and second heart sounds, no murmurs, rubs or gallops Abdomen: no tenderness or distention, no masses by palpation, no abnormal pulsatility or arterial bruits, normal bowel sounds, no hepatosplenomegaly Extremities: no clubbing, cyanosis or edema; 2+ radial, ulnar and brachial pulses bilaterally; 2+ right femoral, posterior tibial and dorsalis pedis pulses; 2+ left femoral, posterior tibial and dorsalis pedis pulses; no subclavian or femoral bruits Neurological: grossly nonfocal   EKG: NSR  ASSESSMENT AND PLAN  Renovascular hypertension  This is well controlled on a relatively low dose of angiotensin receptor blocker. She has normal renal function. Percutaneous intervention does not appear to be necessary at this time, but remains an option for the future.  Tobacco abuse  Very interested in smoking cessation. She does believe that she'll able to achieve smoking cessation before the end of the year. We discussed the different mechanisms to help her quit for over 10 minutes.  Hyperlipidemia  When last checked (2 years ago) her LDL cholesterol was greater than 130. Especially since she has renal artery stenosis it would be important to bring her LDL down to less than 100. She thinks Dr. Minna Antis has checked this most recently.  Meds ordered this encounter  Medications  . cyclobenzaprine (FLEXERIL) 10 MG tablet    Sig:     Holli Humbles, MD, [facility] [phone] office [phone] pager

## 2014-01-16 ENCOUNTER — Telehealth (HOSPITAL_COMMUNITY): Payer: Self-pay | Admitting: *Deleted

## 2014-01-18 ENCOUNTER — Telehealth (HOSPITAL_COMMUNITY): Payer: Self-pay | Admitting: *Deleted

## 2014-03-06 ENCOUNTER — Telehealth (HOSPITAL_COMMUNITY): Payer: Self-pay | Admitting: *Deleted

## 2014-05-04 ENCOUNTER — Other Ambulatory Visit: Payer: Self-pay | Admitting: Cardiovascular Disease

## 2014-05-04 NOTE — Telephone Encounter (Signed)
Rx(s) sent to pharmacy electronically.  

## 2014-11-05 ENCOUNTER — Other Ambulatory Visit: Payer: Self-pay | Admitting: Obstetrics & Gynecology

## 2014-11-06 LAB — CYTOLOGY - PAP

## 2015-01-25 ENCOUNTER — Other Ambulatory Visit: Payer: Self-pay | Admitting: Cardiovascular Disease

## 2015-02-05 ENCOUNTER — Encounter: Payer: Self-pay | Admitting: Cardiovascular Disease

## 2015-02-22 ENCOUNTER — Ambulatory Visit: Payer: Self-pay | Admitting: Cardiovascular Disease

## 2015-02-25 ENCOUNTER — Ambulatory Visit: Payer: 59 | Admitting: Cardiovascular Disease

## 2015-02-26 ENCOUNTER — Encounter: Payer: Self-pay | Admitting: Gastroenterology

## 2015-02-26 ENCOUNTER — Other Ambulatory Visit: Payer: Self-pay | Admitting: Cardiovascular Disease

## 2015-02-26 NOTE — Telephone Encounter (Signed)
REFIL 

## 2015-03-31 ENCOUNTER — Other Ambulatory Visit: Payer: Self-pay | Admitting: Cardiovascular Disease

## 2015-04-02 ENCOUNTER — Telehealth: Payer: Self-pay | Admitting: Cardiovascular Disease

## 2015-04-02 MED ORDER — LOSARTAN POTASSIUM 25 MG PO TABS: 25.0000 mg | ORAL_TABLET | Freq: Two times a day (BID) | ORAL | Status: DC

## 2015-04-02 NOTE — Telephone Encounter (Signed)
Pt Losartan was denied because she need an appointment.Pt says she need her medicine. She can not get an appointment until after February 1st. This is when her insurance goes into effect.Please call her Losartan to [facility] in [place].Please give her enough until her appt on February 8,2017. Thank you so much.

## 2015-04-02 NOTE — Telephone Encounter (Signed)
Refill sent to the pharmacy electronically.  

## 2015-04-30 NOTE — Progress Notes (Signed)
Patient ID: Sheri Perkins, female   DOB: 1960-06-18, 55 y.o.   MRN: [id number]    Cardiology Office Note    Date:  04/30/2015   ID:  Sheri Perkins, DOB 1960-11-20, MRN [id number]  PCP:  Daphene Jaeger, PA-C  Cardiologist:   Sanda Klein, MD   No chief complaint on file.   History of Present Illness:  Sheri Perkins is a 54 y.o. female with a history of a 60-99% right renal artery stenosis, presumed secondary to fibromuscular dysplasia, smoking and mild hyperlipidemia.  She has generally done very well. She quit smoking, started up again and now has quit smoking once more. She has difficulty staying quit due to her husband's emotional imbalance (he had a severe brain injury and his personality has changed over since). She is having increasing difficulty dealing with his impulsive outbursts. She is especially terrified when he drives the car. She thinks she is developing some symptoms of depression because of this.  From a physical standpoint she has not had problems with angina or dyspnea. She had one spell of dizziness when she was swinging in the park with her granddaughter and realizes that she had not taken her morning medicines. After she took her losartan, her symptoms improved. She has not had any other cardiovascular complaints.    Past Medical History  Diagnosis Date  . Renovascular hypertension   . Tobacco abuse   . Acute bronchiolitis   . Renal artery stenosis 04/03/2011    right 1-59% proximal reduction,left main 1-59% reduction,    Past Surgical History  Procedure Laterality Date  . Hand surgery    . Knee surgery    . Arm surgery    . US echocardiography  06/16/2010    normal    Outpatient Prescriptions Prior to Visit  Medication Sig Dispense Refill  . ALPRAZolam (XANAX) 0.5 MG tablet Take 0.5 mg by mouth 2 (two) times daily.    Marland Kitchen aspirin 81 MG tablet Take 81 mg by mouth daily.    . cholecalciferol (VITAMIN D) 1000 UNITS tablet Take 1,000 Units by mouth  daily.    . cyclobenzaprine (FLEXERIL) 10 MG tablet     . estradiol (ESTRACE) 2 MG tablet Take 2 mg by mouth daily.    Marland Kitchen losartan (COZAAR) 25 MG tablet Take 1 tablet (25 mg total) by mouth 2 (two) times daily. 60 tablet 1  . meloxicam (MOBIC) 7.5 MG tablet Take 1-2 tablets daily with food for her hand pain 30 tablet 1  . progesterone (PROMETRIUM) 100 MG capsule Take 100 mg by mouth at bedtime. For 12 consecutive days     No facility-administered medications prior to visit.     Allergies:   Macrobid; Sulfa antibiotics; and Sulfonamide derivatives   Social History   Social History  . Marital Status: Married    Spouse Name: N/A  . Number of Children: N/A  . Years of Education: N/A   Social History Main Topics  . Smoking status: Current Every Day Smoker -- 1.50 packs/day    Types: Cigarettes  . Smokeless tobacco: Not on file  . Alcohol Use: No  . Drug Use: No  . Sexual Activity: Not on file   Other Topics Concern  . Not on file   Social History Narrative  . No narrative on file     Family History:  The patient's mother has coronary artery disease status post stent, obstructive sleep apnea, hyperlipidemia  ROS:   Please see the history of present  illness.    ROS All other systems reviewed and are negative.   PHYSICAL EXAM:   VS:  There were no vitals taken for this visit.   GEN: Well nourished, well developed, in no acute distress HEENT: normal Neck: no JVD, carotid bruits, or masses Cardiac: RRR; no murmurs, rubs, or gallops,no edema  Respiratory:  clear to auscultation bilaterally, normal work of breathing GI: soft, nontender, nondistended, + BS MS: no deformity or atrophy Skin: warm and dry, no rash Neuro:  Alert and Oriented x 3, Strength and sensation are intact Psych: euthymic mood, full affect  Wt Readings from Last 3 Encounters:  12/18/13 70.943 kg (156 lb 6.4 oz)  08/12/12 59.557 kg (131 lb 4.8 oz)  10/08/11 69.037 kg (152 lb 3.2 oz)       Studies/Labs Reviewed:   EKG:  EKG is ordered today.  The ekg ordered today demonstrates NSR, QTC 451 ms  Recent Labs: No results found for requested labs within last 365 days.   Lipid Panel No results found for: CHOL, TRIG, HDL, CHOLHDL, VLDL, LDLCALC, LDLDIRECT     ASSESSMENT:    1. Renovascular hypertension   2. Hyperlipidemia   3. Tobacco abuse      PLAN:  In order of problems listed above:  1. Renal artery stenosis/renovascular hypertension: Well-controlled on single agent, normal renal function to date. We'll continue medical management. Time to recheck her labs, particularly BUN and creatinine 2. HLP: Repeat labs today. Not on statin. Note that her renal artery stenosis is felt to be secondary to fibromuscular dysplasia and there is no other evidence of atherosclerotic disease. However, her mother did have coronary problems. Target LDL definitely less than 130, preferably less than 100. 3. Think she'll benefit from treatment with Wellbutrin both for smoking cessation and some incipient symptoms of depression. She does not have a history of seizures. We'll try this medication at least for a while.    Medication Adjustments/Labs and Tests Ordered: Current medicines are reviewed at length with the patient today.  Concerns regarding medicines are outlined above.  Medication changes, Labs and Tests ordered today are listed in the Patient Instructions below. There are no Patient Instructions on file for this visit.     Mikael Spray, MD  04/30/2015 9:11 PM    [facility] [address], [place], [place]  [address] Phone: [phone]; Fax: [phone]

## 2015-05-01 ENCOUNTER — Ambulatory Visit (INDEPENDENT_AMBULATORY_CARE_PROVIDER_SITE_OTHER): Payer: BLUE CROSS/BLUE SHIELD | Admitting: Cardiovascular Disease

## 2015-05-01 ENCOUNTER — Encounter: Payer: Self-pay | Admitting: Cardiovascular Disease

## 2015-05-01 VITALS — BP 120/76 | HR 78 | Ht 60.0 in | Wt 149.2 lb

## 2015-05-01 DIAGNOSIS — Z720 Tobacco use: Secondary | ICD-10-CM | POA: Diagnosis not present

## 2015-05-01 DIAGNOSIS — I150 Renovascular hypertension: Secondary | ICD-10-CM

## 2015-05-01 DIAGNOSIS — Z79899 Other long term (current) drug therapy: Secondary | ICD-10-CM | POA: Diagnosis not present

## 2015-05-01 DIAGNOSIS — E785 Hyperlipidemia, unspecified: Secondary | ICD-10-CM | POA: Diagnosis not present

## 2015-05-01 MED ORDER — BUPROPION HCL ER (SR) 150 MG PO TB12: 150.0000 mg | ORAL_TABLET | Freq: Two times a day (BID) | ORAL | Status: DC

## 2015-05-01 NOTE — Patient Instructions (Signed)
Your physician recommends that you return for lab work in: AT [facility] has recommended you make the following change in your medication:   START BUPROPRION 150 MG  -- TAKE ONE TABLET DAILY X ONE WEEK THEN TWICE DAILY  Dr. Sallyanne Kuster recommends that you schedule a follow-up appointment in: [place]

## 2015-05-11 LAB — COMPREHENSIVE METABOLIC PANEL
ALK PHOS: 108 U/L (ref 33–130)
ALT: 11 U/L (ref 6–29)
AST: 13 U/L (ref 10–35)
Albumin: 3.8 g/dL (ref 3.6–5.1)
BUN: 10 mg/dL (ref 7–25)
CALCIUM: 9 mg/dL (ref 8.6–10.4)
CO2: 27 mmol/L (ref 20–31)
Chloride: 107 mmol/L (ref 98–110)
Creat: 0.64 mg/dL (ref 0.50–1.05)
GLUCOSE: 102 mg/dL — AB (ref 65–99)
POTASSIUM: 4 mmol/L (ref 3.5–5.3)
Sodium: 143 mmol/L (ref 135–146)
Total Bilirubin: 0.4 mg/dL (ref 0.2–1.2)
Total Protein: 6.9 g/dL (ref 6.1–8.1)

## 2015-05-11 LAB — LIPID PANEL
CHOL/HDL RATIO: 4.1 ratio (ref <=5.0)
CHOLESTEROL: 175 mg/dL (ref 125–200)
HDL: 43 mg/dL — ABNORMAL LOW (ref >=46)
LDL Cholesterol: 117 mg/dL (ref <130)
TRIGLYCERIDES: 73 mg/dL (ref <150)
VLDL: 15 mg/dL (ref <30)

## 2015-06-02 ENCOUNTER — Other Ambulatory Visit: Payer: Self-pay | Admitting: Cardiovascular Disease

## 2015-06-03 NOTE — Telephone Encounter (Signed)
REFILL 

## 2015-10-23 ENCOUNTER — Telehealth: Payer: Self-pay

## 2015-10-23 MED ORDER — LOSARTAN POTASSIUM 50 MG PO TABS: 25.0000 mg | ORAL_TABLET | Freq: Two times a day (BID) | ORAL | 6 refills | Status: DC

## 2015-10-23 NOTE — Telephone Encounter (Signed)
Received quanitity form from patients insurance for Losartan. Spoke with patient and offered to increase the strength to 50mg   and she take half tab twice a day. She agreed and voiced understanding. Med sent to [facility] in [place].

## 2016-02-10 ENCOUNTER — Ambulatory Visit (AMBULATORY_SURGERY_CENTER): Payer: Self-pay | Admitting: *Deleted

## 2016-02-10 VITALS — Ht 60.0 in | Wt 152.0 lb

## 2016-02-10 DIAGNOSIS — Z86010 Personal history of colonic polyps: Secondary | ICD-10-CM

## 2016-02-10 MED ORDER — NA SULFATE-K SULFATE-MG SULF 17.5-3.13-1.6 GM/177ML PO SOLN: 1.0000 | Freq: Once | ORAL | 0 refills | Status: AC

## 2016-02-10 NOTE — Progress Notes (Signed)
No egg or soy allergy known to patient  No issues with past sedation with any surgeries  or procedures, no intubation problems  No diet pills per patient No home 02 use per patient  No blood thinners per patient  Pt denies issues with constipation  No A fib or A flutter   

## 2016-02-18 ENCOUNTER — Encounter: Payer: Self-pay | Admitting: Gastroenterology

## 2016-02-24 ENCOUNTER — Ambulatory Visit (AMBULATORY_SURGERY_CENTER): Payer: BLUE CROSS/BLUE SHIELD | Admitting: Gastroenterology

## 2016-02-24 ENCOUNTER — Encounter: Payer: Self-pay | Admitting: Gastroenterology

## 2016-02-24 VITALS — BP 139/73 | HR 61 | Temp 98.0°F | Resp 13 | Ht 60.0 in | Wt 152.0 lb

## 2016-02-24 DIAGNOSIS — K621 Rectal polyp: Secondary | ICD-10-CM

## 2016-02-24 DIAGNOSIS — D128 Benign neoplasm of rectum: Secondary | ICD-10-CM

## 2016-02-24 DIAGNOSIS — Z86010 Personal history of colonic polyps: Secondary | ICD-10-CM | POA: Diagnosis present

## 2016-02-24 MED ORDER — SODIUM CHLORIDE 0.9 % IV SOLN: 500.0000 mL | INTRAVENOUS | Status: DC

## 2016-02-24 NOTE — Op Note (Signed)
[facility] Patient Name: Sheri Perkins Procedure Date: 02/24/2016 1:21 PM MRN: [id number] Endoscopist: Mauri Pole , MD Age: 55 Referring MD:  Date of Birth: February 25, 1961 Gender: Female Account #: [id number] Procedure:                Colonoscopy Indications:              Surveillance: Personal history of adenomatous                            polyps on last colonoscopy > 5 years ago, High risk                            colon cancer surveillance: Personal history of                            sessile serrated colon polyp (less than 10 mm in                            size) with no dysplasia Medicines:                Monitored Anesthesia Care Procedure:                Pre-Anesthesia Assessment:                           - Prior to the procedure, a History and Physical                            was performed, and patient medications and                            allergies were reviewed. The patient's tolerance of                            previous anesthesia was also reviewed. The risks                            and benefits of the procedure and the sedation                            options and risks were discussed with the patient.                            All questions were answered, and informed consent                            was obtained. Prior Anticoagulants: The patient has                            taken no previous anticoagulant or antiplatelet                            agents. ASA Grade Assessment: II - A patient with  mild systemic disease. After reviewing the risks                            and benefits, the patient was deemed in                            satisfactory condition to undergo the procedure.                           After obtaining informed consent, the colonoscope                            was passed under direct vision. Throughout the                            procedure, the patient's blood  pressure, pulse, and                            oxygen saturations were monitored continuously. The                            Model CF-HQ190L [id number]) scope was introduced                            through the anus and advanced to the the terminal                            ileum, with identification of the appendiceal                            orifice and IC valve. The colonoscopy was performed                            without difficulty. The patient tolerated the                            procedure well. The quality of the bowel                            preparation was good. The terminal ileum, ileocecal                            valve, appendiceal orifice, and rectum were                            photographed. Scope In: 1:34:15 PM Scope Out: 1:50:23 PM Scope Withdrawal Time: 0 hours 12 minutes 10 seconds  Total Procedure Duration: 0 hours 16 minutes 8 seconds  Findings:                 The perianal and digital rectal examinations were                            normal.  Three hyperplastic appearing polyps were found in                            the rectum. The polyps were 2 to 3 mm in size.                            These polyps were removed with a cold biopsy                            forceps. Resection and retrieval were complete.                           A scattered area of mild melanosis was found in the                            recto-sigmoid colon, in the descending colon, in                            the transverse colon and in the ascending colon.                           A few small-mouthed diverticula were found in the                            sigmoid colon and descending colon. Complications:            No immediate complications. Estimated Blood Loss:     Estimated blood loss was minimal. Impression:               - Three 2 to 3 mm polyps in the rectum, removed                            with a cold biopsy forceps. Resected  and retrieved.                           - Melanosis in the colon.                           - Diverticulosis in the sigmoid colon and in the                            descending colon. Recommendation:           - Patient has a contact number available for                            emergencies. The signs and symptoms of potential                            delayed complications were discussed with the                            patient. Return to normal activities tomorrow.  Written discharge instructions were provided to the                            patient.                           - Resume previous diet.                           - Continue present medications.                           - Await pathology results.                           - Repeat colonoscopy in 5-10 years for surveillance                            based on pathology results. Mauri Pole, MD 02/24/2016 1:56:42 PM This report has been signed electronically.

## 2016-02-24 NOTE — Progress Notes (Signed)
To recovery, report to Jones, RN, VSS 

## 2016-02-24 NOTE — Patient Instructions (Signed)
Impression / Recommendation  Polyps ( handout given) Diverticulosis (handout given) High Fiber Diet (handout given) Melanosis   YOU HAD AN ENDOSCOPIC PROCEDURE TODAY AT [facility]:   Refer to the procedure report that was given to you for any specific questions about what was found during the examination.  If the procedure report does not answer your questions, please call your gastroenterologist to clarify.  If you requested that your care partner not be given the details of your procedure findings, then the procedure report has been included in a sealed envelope for you to review at your convenience later.  YOU SHOULD EXPECT: Some feelings of bloating in the abdomen. Passage of more gas than usual.  Walking can help get rid of the air that was put into your GI tract during the procedure and reduce the bloating. If you had a lower endoscopy (such as a colonoscopy or flexible sigmoidoscopy) you may notice spotting of blood in your stool or on the toilet paper. If you underwent a bowel prep for your procedure, you may not have a normal bowel movement for a few days.  Please Note:  You might notice some irritation and congestion in your nose or some drainage.  This is from the oxygen used during your procedure.  There is no need for concern and it should clear up in a day or so.  SYMPTOMS TO REPORT IMMEDIATELY:   Following lower endoscopy (colonoscopy or flexible sigmoidoscopy):  Excessive amounts of blood in the stool  Significant tenderness or worsening of abdominal pains  Swelling of the abdomen that is new, acute  Fever of 100F or higher  For urgent or emergent issues, a gastroenterologist can be reached at any hour by calling [phone].   DIET:  We do recommend a small meal at first, but then you may proceed to your regular diet.  Drink plenty of fluids but you should avoid alcoholic beverages for 24 hours.  ACTIVITY:  You should plan to take it easy for the rest  of today and you should NOT DRIVE or use heavy machinery until tomorrow (because of the sedation medicines used during the test).    FOLLOW UP: Our staff will call the number listed on your records the next business day following your procedure to check on you and address any questions or concerns that you may have regarding the information given to you following your procedure. If we do not reach you, we will leave a message.  However, if you are feeling well and you are not experiencing any problems, there is no need to return our call.  We will assume that you have returned to your regular daily activities without incident.  If any biopsies were taken you will be contacted by phone or by letter within the next 1-3 weeks.  Please call us at [phone] if you have not heard about the biopsies in 3 weeks.    SIGNATURES/CONFIDENTIALITY: You and/or your care partner have signed paperwork which will be entered into your electronic medical record.  These signatures attest to the fact that that the information above on your After Visit Summary has been reviewed and is understood.  Full responsibility of the confidentiality of this discharge information lies with you and/or your care-partner.

## 2016-02-24 NOTE — Progress Notes (Signed)
Called to room to assist during endoscopic procedure.  Patient ID and intended procedure confirmed with present staff. Received instructions for my participation in the procedure from the performing physician.  

## 2016-02-25 ENCOUNTER — Telehealth: Payer: Self-pay

## 2016-02-25 NOTE — Telephone Encounter (Signed)
  Follow up Call-  Call back number 02/24/2016  Post procedure Call Back phone  # [phone]  Permission to leave phone message Yes  Some recent data might be hidden     Patient questions:  Do you have a fever, pain , or abdominal swelling? No. Pain Score  0 *  Have you tolerated food without any problems? Yes.    Have you been able to return to your normal activities? Yes.    Do you have any questions about your discharge instructions: Diet   No. Medications  No. Follow up visit  No.  Do you have questions or concerns about your Care? No.  Actions: * If pain score is 4 or above: No action needed, pain <4.

## 2016-02-29 ENCOUNTER — Encounter: Payer: Self-pay | Admitting: Gastroenterology

## 2016-05-27 ENCOUNTER — Other Ambulatory Visit: Payer: Self-pay | Admitting: Cardiovascular Disease

## 2016-05-27 NOTE — Telephone Encounter (Signed)
Rx(s) sent to pharmacy electronically.  

## 2016-06-08 ENCOUNTER — Other Ambulatory Visit: Payer: Self-pay | Admitting: Cardiovascular Disease

## 2016-07-27 ENCOUNTER — Encounter: Payer: Self-pay | Admitting: Cardiovascular Disease

## 2016-07-27 ENCOUNTER — Ambulatory Visit (INDEPENDENT_AMBULATORY_CARE_PROVIDER_SITE_OTHER): Payer: BLUE CROSS/BLUE SHIELD | Admitting: Cardiovascular Disease

## 2016-07-27 VITALS — BP 130/82 | HR 77 | Ht 60.0 in | Wt 156.0 lb

## 2016-07-27 DIAGNOSIS — Z79899 Other long term (current) drug therapy: Secondary | ICD-10-CM | POA: Diagnosis not present

## 2016-07-27 DIAGNOSIS — R55 Syncope and collapse: Secondary | ICD-10-CM

## 2016-07-27 DIAGNOSIS — E785 Hyperlipidemia, unspecified: Secondary | ICD-10-CM | POA: Diagnosis not present

## 2016-07-27 DIAGNOSIS — R600 Localized edema: Secondary | ICD-10-CM

## 2016-07-27 DIAGNOSIS — I150 Renovascular hypertension: Secondary | ICD-10-CM | POA: Diagnosis not present

## 2016-07-27 DIAGNOSIS — Z720 Tobacco use: Secondary | ICD-10-CM | POA: Diagnosis not present

## 2016-07-27 NOTE — Progress Notes (Signed)
Patient ID: Sheri Perkins, female   DOB: 1961/01/30, 56 y.o.   MRN: [id number]    Cardiology Office Note    Date:  07/27/2016   ID:  Sheri Perkins, DOB 18-Feb-1961, MRN [id number]  PCP:  Daphene Jaeger, PA-C  Cardiologist:   Sanda Klein, MD   Chief Complaint  Patient presents with  . Follow-up    History of Present Illness:  Sheri Perkins is a 56 y.o. female with a history of a 60-99% right renal artery stenosis, presumed secondary to fibromuscular dysplasia, smoking and mild hyperlipidemia.  She has generally done very well. Unfortunately, she has started smoking again, after she returned to work. She smokes roughly a pack of cigarettes a week.  She has had several episodes of near syncope, preceded by flushing, followed by nausea and relieved by vomiting. She has not had complete syncope in the recent past. She remembers passing out when she saw her mother's blood stained surgical dressing, when she was 56 years old. If she sees needles she develops presyncope. Her uncle has similar symptoms.  From a physical standpoint she has not had problems with angina or dyspnea. She has swollen ankles after a long day at work. She denies exertional symptoms. She does not have orthopnea or PND. Denies palpitations. She has not had any other cardiovascular complaints.    Past Medical History:  Diagnosis Date  . Acute bronchiolitis   . Allergy   . Arthritis    fingers , back, neck   . Hyperlipidemia   . Renal artery stenosis (HCC) 04/03/2011   right 1-59% proximal reduction,left main 1-59% reduction,  . Renovascular hypertension   . Tobacco abuse     Past Surgical History:  Procedure Laterality Date  . arm surgery Bilateral   . CARPAL TUNNEL RELEASE Bilateral   . COLONOSCOPY    . HAND SURGERY    . KNEE SURGERY    . POLYPECTOMY    . SHOULDER SURGERY Left   . US ECHOCARDIOGRAPHY  06/16/2010   normal    Outpatient Medications Prior to Visit  Medication Sig Dispense Refill    . ALPRAZolam (XANAX) 0.5 MG tablet Take 0.5 mg by mouth 2 (two) times daily as needed.     Marland Kitchen aspirin 81 MG tablet Take 81 mg by mouth daily.    Marland Kitchen buPROPion (WELLBUTRIN SR) 150 MG 12 hr tablet Take 1 tablet (150 mg total) by mouth 2 (two) times daily. 60 tablet 3  . cholecalciferol (VITAMIN D) 1000 UNITS tablet Take 1,000 Units by mouth daily.    Marland Kitchen co-enzyme Q-10 30 MG capsule Take 30 mg by mouth daily.    Marland Kitchen losartan (COZAAR) 50 MG tablet TAKE 1/2 (ONE-HALF) TABLET BY MOUTH TWICE DAILY 30 tablet 0  . meloxicam (MOBIC) 7.5 MG tablet Take 1-2 tablets daily with food for her hand pain 30 tablet 1  . PRESCRIPTION MEDICATION Take 1 tablet by mouth at bedtime. Cholesterol medicine- pt unaware of name of medicine     Facility-Administered Medications Prior to Visit  Medication Dose Route Frequency Provider Last Rate Last Dose  . 0.9 %  sodium chloride infusion  500 mL Intravenous Continuous Nandigam, Venia Minks, MD         Allergies:   Macrobid [nitrofurantoin macrocrystal]; Sulfa antibiotics; Sulfonamide derivatives; and Latex   Social History   Social History  . Marital status: Married    Spouse name: N/A  . Number of children: N/A  . Years of education: N/A  Social History Main Topics  . Smoking status: Current Some Day Smoker    Packs/day: 0.12    Types: Cigarettes  . Smokeless tobacco: Never Used  . Alcohol use No  . Drug use: No  . Sexual activity: Not Asked   Other Topics Concern  . None   Social History Narrative  . None     Family History:  The patient's mother has coronary artery disease status post stent, obstructive sleep apnea, hyperlipidemia  ROS:   Please see the history of present illness.    ROS All other systems reviewed and are negative.   PHYSICAL EXAM:   VS:  BP 130/82   Pulse 77   Ht 5' (1.524 m)   Wt 70.8 kg (156 lb)   BMI 30.47 kg/m    GEN: Well nourished, well developed, in no acute distress  HEENT: normal  Neck: no JVD, carotid bruits, or  masses Cardiac: RRR; no murmurs, rubs, or gallops,no edema  Respiratory:  clear to auscultation bilaterally, normal work of breathing GI: soft, nontender, nondistended, + BS MS: no deformity or atrophy  Skin: warm and dry, no rash Neuro:  Alert and Oriented x 3, Strength and sensation are intact Psych: euthymic mood, full affect  Wt Readings from Last 3 Encounters:  07/27/16 70.8 kg (156 lb)  02/24/16 68.9 kg (152 lb)  02/10/16 68.9 kg (152 lb)      Studies/Labs Reviewed:   EKG:  EKG is ordered today.  The ekg ordered today demonstrates NSR, QTC 451 ms  Recent Labs: 06/24/2016 glucose 94, cholesterol 152, triglycerides 88, HDL 43, LDL 91, creatinine 0.68, TSH 1.62    ASSESSMENT:    1. Renovascular hypertension   2. Dyslipidemia   3. Tobacco abuse   4. Neurocardiogenic syncope   5. Lower extremity edema   6. Medication management      PLAN:  In order of problems listed above:  1. Renal artery stenosis/renovascular hypertension: Well-controlled on single agent, normal renal function to date. We'll continue medical management.  2. HLP: Cholesterol values have improved. She is a smoker and her mother had coronary artery disease, but the patient herself has no had vascular issues. Her renal artery stenosis is due to fibromuscular dysplasia rather than atherosclerosis. Target LDL definitely less than 130, preferably less than 100. 3. Smoking cessation again reviewed the importance of avoiding tobacco. 4. Neurally mediated syncope: Discussed the mechanism of this disorder. Unfortunately her job involves standing up for hours on and without moving. Recommend compression stockings, which will also help with her ankle swelling. Aggressively hydrate. Consider switching from losartan to a beta blocker, but it took [place] a while to find an effective than simple treatment for her hypertension. For the same reason, cannot advise salt loading. Avoid natural diuretic such as caffeine and  alcohol. Most importantly, he prodrome of symptoms promptly and lie down.    Medication Adjustments/Labs and Tests Ordered: Current medicines are reviewed at length with the patient today.  Concerns regarding medicines are outlined above.  Medication changes, Labs and Tests ordered today are listed in the Patient Instructions below. Patient Instructions  Dr Sallyanne Kuster recommends that you continue on your current medications as directed. Please refer to the Current Medication list given to you today.  Your physician recommends that you return for lab work at your convenience - FASTING.  Dr Sallyanne Kuster recommends that you schedule a follow-up appointment in 12 months. You will receive a reminder letter in the mail two months in advance.  If you don't receive a letter, please call our office to schedule the follow-up appointment.  If you need a refill on your cardiac medications before your next appointment, please call your pharmacy.  Your physician discussed the hazards of tobacco use. Tobacco use cessation is recommended and techniques and options to help you quit were discussed.   Compression Stockings Compression stockings are elastic stockings that "compress" your legs. This helps to increase blood flow, decrease swelling, and reduces the chance of getting blood clots in your lower legs. Compression stockings are used:  After surgery.  If you have a history of poor circulation.  If you are prone to blood clots.  If you have varicose veins.  If you sit or are bedridden for long periods of time. WEARING COMPRESSION STOCKINGS  Your compression stockings should be worn as instructed by your caregiver.  Wearing the correct stocking size is important. Your caregiver can help measure and fit you to the correct size.  When wearing your stockings, do not allow the stockings to bunch up. This is especially important around your toes or behind your knees. Keep the stockings as smooth as  possible.  Do not roll the stockings downward and leave them rolled down. This can form a restrictive band around your legs and can decrease blood flow.  The stockings should be removed once a day for 1 hour or as instructed by your caregiver. When the stockings are taken off, inspect your legs and feet. Look for:  Open sores.  Red spots.  Puffy areas (swelling).  Anything that does not seem normal. IMPORTANT INFORMATION ABOUT COMPRESSION STOCKINGS  The compression stockings should be clean, dry, and in good condition before you put them on.  Do not put lotion on your legs or feet. This makes it harder to put the stockings on.  Change your stockings immediately if they become wet or soiled.  Do not wear stockings that are ripped or torn.  You may hand-wash or put your stockings in the washing machine. Use cold or warm water with mild detergent. Do not bleach your stockings. They may be air-dried or dried in the dryer on low heat.  If you have pain or have a feeling of "pins and needles" in your feet or legs, you may be wearing stockings that are too tight. Call your caregiver right away. SEEK IMMEDIATE MEDICAL CARE IF:   You have numbness or tingling in your lower legs that does not get better quickly after the stockings are removed.  Your toes or feet become cold and blue.  You develop open sores or have red spots on your legs that do not go away. MAKE SURE YOU:   Understand these instructions.  Will watch your condition.  Will get help right away if you are not doing well or get worse. Document Released: 01/04/2009 Document Revised: 06/01/2011 Document Reviewed: 01/04/2009 [facility] Patient Information 2014 [place], [place].   Syncope Syncope is when you temporarily lose consciousness. Syncope may also be called fainting or passing out. It is caused by a sudden decrease in blood flow to the brain. Even though most causes of syncope are not dangerous, syncope can be a sign  of a serious medical problem. Signs that you may be about to faint include:  Feeling dizzy or light-headed.  Feeling nauseous.  Seeing all white or all black in your field of vision.  Having cold, clammy skin. If you fainted, get medical help right away.Call your local emergency services (911 in  the [place] Do not drive yourself to the hospital. Follow these instructions at home: Pay attention to any changes in your symptoms. Take these actions to help with your condition:  Have someone stay with you until you feel stable.  Do not drive, use machinery, or play sports until your health care provider says it is okay.  Keep all follow-up visits as told by your health care provider. This is important.  If you start to feel like you might faint, lie down right away and raise (elevate) your feet above the level of your heart. Breathe deeply and steadily. Wait until all of the symptoms have passed.  Drink enough fluid to keep your urine clear or pale yellow.  If you are taking blood pressure or heart medicine, get up slowly and take several minutes to sit and then stand. This can reduce dizziness.  Take over-the-counter and prescription medicines only as told by your health care provider. Get help right away if:  You have a severe headache.  You have unusual pain in your chest, abdomen, or back.  You are bleeding from your mouth or rectum, or you have black or tarry stool.  You have a very fast or irregular heartbeat (palpitations).  You have pain with breathing.  You faint once or repeatedly.  You have a seizure.  You are confused.  You have trouble walking.  You have severe weakness.  You have vision problems. These symptoms may represent a serious problem that is an emergency. Do not wait to see if your symptoms will go away. Get medical help right away. Call your local emergency services (911 in the [place] Do not drive yourself to the hospital. This information is not  intended to replace advice given to you by your health care provider. Make sure you discuss any questions you have with your health care provider. Document Released: 03/09/2005 Document Revised: 08/15/2015 Document Reviewed: 11/21/2014 Elsevier Interactive Patient Education  2017 [place], Sanda Klein, MD  07/27/2016 10:02 AM    [facility] [address], [place], [place]  [address] Phone: [phone]; Fax: [phone]

## 2016-07-27 NOTE — Patient Instructions (Addendum)
Dr Sallyanne Kuster recommends that you continue on your current medications as directed. Please refer to the Current Medication list given to you today.  Your physician recommends that you return for lab work at your convenience - FASTING.  Dr Sallyanne Kuster recommends that you schedule a follow-up appointment in 12 months. You will receive a reminder letter in the mail two months in advance. If you don't receive a letter, please call our office to schedule the follow-up appointment.  If you need a refill on your cardiac medications before your next appointment, please call your pharmacy.  Your physician discussed the hazards of tobacco use. Tobacco use cessation is recommended and techniques and options to help you quit were discussed.    Compression Stockings Compression stockings are elastic stockings that "compress" your legs. This helps to increase blood flow, decrease swelling, and reduces the chance of getting blood clots in your lower legs. Compression stockings are used:  After surgery.  If you have a history of poor circulation.  If you are prone to blood clots.  If you have varicose veins.  If you sit or are bedridden for long periods of time. WEARING COMPRESSION STOCKINGS  Your compression stockings should be worn as instructed by your caregiver.  Wearing the correct stocking size is important. Your caregiver can help measure and fit you to the correct size.  When wearing your stockings, do not allow the stockings to bunch up. This is especially important around your toes or behind your knees. Keep the stockings as smooth as possible.  Do not roll the stockings downward and leave them rolled down. This can form a restrictive band around your legs and can decrease blood flow.  The stockings should be removed once a day for 1 hour or as instructed by your caregiver. When the stockings are taken off, inspect your legs and feet. Look for:  Open sores.  Red spots.  Puffy areas  (swelling).  Anything that does not seem normal. IMPORTANT INFORMATION ABOUT COMPRESSION STOCKINGS  The compression stockings should be clean, dry, and in good condition before you put them on.  Do not put lotion on your legs or feet. This makes it harder to put the stockings on.  Change your stockings immediately if they become wet or soiled.  Do not wear stockings that are ripped or torn.  You may hand-wash or put your stockings in the washing machine. Use cold or warm water with mild detergent. Do not bleach your stockings. They may be air-dried or dried in the dryer on low heat.  If you have pain or have a feeling of "pins and needles" in your feet or legs, you may be wearing stockings that are too tight. Call your caregiver right away. SEEK IMMEDIATE MEDICAL CARE IF:   You have numbness or tingling in your lower legs that does not get better quickly after the stockings are removed.  Your toes or feet become cold and blue.  You develop open sores or have red spots on your legs that do not go away. MAKE SURE YOU:   Understand these instructions.  Will watch your condition.  Will get help right away if you are not doing well or get worse. Document Released: 01/04/2009 Document Revised: 06/01/2011 Document Reviewed: 01/04/2009 [facility] Patient Information 2014 [place], [place].   Syncope Syncope is when you temporarily lose consciousness. Syncope may also be called fainting or passing out. It is caused by a sudden decrease in blood flow to the brain. Even though most  causes of syncope are not dangerous, syncope can be a sign of a serious medical problem. Signs that you may be about to faint include:  Feeling dizzy or light-headed.  Feeling nauseous.  Seeing all white or all black in your field of vision.  Having cold, clammy skin. If you fainted, get medical help right away.Call your local emergency services (911 in the [place] Do not drive yourself to the hospital. Follow  these instructions at home: Pay attention to any changes in your symptoms. Take these actions to help with your condition:  Have someone stay with you until you feel stable.  Do not drive, use machinery, or play sports until your health care provider says it is okay.  Keep all follow-up visits as told by your health care provider. This is important.  If you start to feel like you might faint, lie down right away and raise (elevate) your feet above the level of your heart. Breathe deeply and steadily. Wait until all of the symptoms have passed.  Drink enough fluid to keep your urine clear or pale yellow.  If you are taking blood pressure or heart medicine, get up slowly and take several minutes to sit and then stand. This can reduce dizziness.  Take over-the-counter and prescription medicines only as told by your health care provider. Get help right away if:  You have a severe headache.  You have unusual pain in your chest, abdomen, or back.  You are bleeding from your mouth or rectum, or you have black or tarry stool.  You have a very fast or irregular heartbeat (palpitations).  You have pain with breathing.  You faint once or repeatedly.  You have a seizure.  You are confused.  You have trouble walking.  You have severe weakness.  You have vision problems. These symptoms may represent a serious problem that is an emergency. Do not wait to see if your symptoms will go away. Get medical help right away. Call your local emergency services (911 in the [place] Do not drive yourself to the hospital. This information is not intended to replace advice given to you by your health care provider. Make sure you discuss any questions you have with your health care provider. Document Released: 03/09/2005 Document Revised: 08/15/2015 Document Reviewed: 11/21/2014 Elsevier Interactive Patient Education  2017 [employer].

## 2016-08-02 ENCOUNTER — Other Ambulatory Visit: Payer: Self-pay | Admitting: Cardiovascular Disease

## 2016-08-03 NOTE — Telephone Encounter (Signed)
New Message   *STAT* If patient is at the pharmacy, call can be transferred to refill team.   1. Which medications need to be refilled? (please list name of each medication and dose if known) losartan (Cozaar) 50 mg tablet take 1/2 tablet twice daily  2. Which pharmacy/location (including street and city if local pharmacy) is medication to be sent to? [address], [place], [place]  3. Do they need a 30 day or 90 day supply? Pt voiced she would like 30 day supply but for a whole year so she will not have to keep calling for refills.  Pt voiced once it's filled for pharmacist to reach out to patient and pt voiced she goes to work at 2 pm  Pt voiced she is completely out for today.

## 2017-03-31 ENCOUNTER — Inpatient Hospital Stay (HOSPITAL_COMMUNITY): Payer: BLUE CROSS/BLUE SHIELD

## 2017-03-31 ENCOUNTER — Inpatient Hospital Stay (HOSPITAL_COMMUNITY)
Admission: EM | Admit: 2017-03-31 | Discharge: 2017-04-02 | DRG: 871 | Disposition: A | Discharge status: Home / Self Care | Payer: BLUE CROSS/BLUE SHIELD | Attending: Internal Medicine | Admitting: Internal Medicine

## 2017-03-31 ENCOUNTER — Encounter (HOSPITAL_COMMUNITY): Payer: Self-pay | Admitting: Emergency Medicine

## 2017-03-31 ENCOUNTER — Other Ambulatory Visit: Payer: Self-pay

## 2017-03-31 ENCOUNTER — Emergency Department (HOSPITAL_COMMUNITY): Payer: BLUE CROSS/BLUE SHIELD

## 2017-03-31 DIAGNOSIS — J42 Unspecified chronic bronchitis: Secondary | ICD-10-CM | POA: Diagnosis present

## 2017-03-31 DIAGNOSIS — R7401 Elevation of levels of liver transaminase levels: Secondary | ICD-10-CM | POA: Diagnosis present

## 2017-03-31 DIAGNOSIS — F17210 Nicotine dependence, cigarettes, uncomplicated: Secondary | ICD-10-CM | POA: Diagnosis present

## 2017-03-31 DIAGNOSIS — I150 Renovascular hypertension: Secondary | ICD-10-CM | POA: Diagnosis present

## 2017-03-31 DIAGNOSIS — I959 Hypotension, unspecified: Secondary | ICD-10-CM | POA: Diagnosis present

## 2017-03-31 DIAGNOSIS — Z888 Allergy status to other drugs, medicaments and biological substances status: Secondary | ICD-10-CM | POA: Diagnosis not present

## 2017-03-31 DIAGNOSIS — E785 Hyperlipidemia, unspecified: Secondary | ICD-10-CM | POA: Diagnosis present

## 2017-03-31 DIAGNOSIS — A419 Sepsis, unspecified organism: Principal | ICD-10-CM | POA: Diagnosis present

## 2017-03-31 DIAGNOSIS — Z79899 Other long term (current) drug therapy: Secondary | ICD-10-CM | POA: Diagnosis not present

## 2017-03-31 DIAGNOSIS — Z91040 Latex allergy status: Secondary | ICD-10-CM

## 2017-03-31 DIAGNOSIS — J189 Pneumonia, unspecified organism: Secondary | ICD-10-CM | POA: Diagnosis present

## 2017-03-31 DIAGNOSIS — Z882 Allergy status to sulfonamides status: Secondary | ICD-10-CM

## 2017-03-31 DIAGNOSIS — R0603 Acute respiratory distress: Secondary | ICD-10-CM | POA: Diagnosis present

## 2017-03-31 DIAGNOSIS — N179 Acute kidney failure, unspecified: Secondary | ICD-10-CM | POA: Diagnosis present

## 2017-03-31 DIAGNOSIS — J1108 Influenza due to unidentified influenza virus with specified pneumonia: Secondary | ICD-10-CM | POA: Diagnosis present

## 2017-03-31 DIAGNOSIS — Z7982 Long term (current) use of aspirin: Secondary | ICD-10-CM | POA: Diagnosis not present

## 2017-03-31 DIAGNOSIS — J1100 Influenza due to unidentified influenza virus with unspecified type of pneumonia: Secondary | ICD-10-CM | POA: Diagnosis present

## 2017-03-31 DIAGNOSIS — R740 Nonspecific elevation of levels of transaminase and lactic acid dehydrogenase [LDH]: Secondary | ICD-10-CM | POA: Diagnosis present

## 2017-03-31 DIAGNOSIS — Z720 Tobacco use: Secondary | ICD-10-CM | POA: Diagnosis present

## 2017-03-31 DIAGNOSIS — I10 Essential (primary) hypertension: Secondary | ICD-10-CM | POA: Diagnosis present

## 2017-03-31 DIAGNOSIS — J9600 Acute respiratory failure, unspecified whether with hypoxia or hypercapnia: Secondary | ICD-10-CM | POA: Insufficient documentation

## 2017-03-31 DIAGNOSIS — D72829 Elevated white blood cell count, unspecified: Secondary | ICD-10-CM | POA: Diagnosis present

## 2017-03-31 LAB — PROTIME-INR
INR: 1.29
PROTHROMBIN TIME: 16 s — AB (ref 11.4–15.2)

## 2017-03-31 LAB — CBC WITH DIFFERENTIAL/PLATELET
Basophils Absolute: 0 10*3/uL (ref 0.0–0.1)
Basophils Relative: 0 %
Eosinophils Absolute: 0 10*3/uL (ref 0.0–0.7)
Eosinophils Relative: 0 %
HCT: 38.7 % (ref 36.0–46.0)
Hemoglobin: 12.8 g/dL (ref 12.0–15.0)
Lymphocytes Relative: 5 %
Lymphs Abs: 1.7 10*3/uL (ref 0.7–4.0)
MCH: 30.5 pg (ref 26.0–34.0)
MCHC: 33.1 g/dL (ref 30.0–36.0)
MCV: 92.4 fL (ref 78.0–100.0)
Monocytes Absolute: 1.7 10*3/uL — ABNORMAL HIGH (ref 0.1–1.0)
Monocytes Relative: 5 %
Neutro Abs: 30.6 10*3/uL — ABNORMAL HIGH (ref 1.7–7.7)
Neutrophils Relative %: 90 %
Platelets: 238 10*3/uL (ref 150–400)
RBC: 4.19 MIL/uL (ref 3.87–5.11)
RDW: 14.2 % (ref 11.5–15.5)
WBC Morphology: INCREASED
WBC: 34 10*3/uL — ABNORMAL HIGH (ref 4.0–10.5)

## 2017-03-31 LAB — URINALYSIS, ROUTINE W REFLEX MICROSCOPIC
Bilirubin Urine: NEGATIVE
GLUCOSE, UA: NEGATIVE mg/dL
Ketones, ur: NEGATIVE mg/dL
NITRITE: POSITIVE — AB
PH: 5 (ref 5.0–8.0)
PROTEIN: 100 mg/dL — AB
Specific Gravity, Urine: 1.019 (ref 1.005–1.030)

## 2017-03-31 LAB — COMPREHENSIVE METABOLIC PANEL
ALT: 13 U/L — ABNORMAL LOW (ref 14–54)
AST: 18 U/L (ref 15–41)
Albumin: 2.8 g/dL — ABNORMAL LOW (ref 3.5–5.0)
Alkaline Phosphatase: 144 U/L — ABNORMAL HIGH (ref 38–126)
Anion gap: 11 (ref 5–15)
BUN: 24 mg/dL — ABNORMAL HIGH (ref 6–20)
CO2: 24 mmol/L (ref 22–32)
Calcium: 8.5 mg/dL — ABNORMAL LOW (ref 8.9–10.3)
Chloride: 103 mmol/L (ref 101–111)
Creatinine, Ser: 1.22 mg/dL — ABNORMAL HIGH (ref 0.44–1.00)
GFR calc Af Amer: 55 mL/min — ABNORMAL LOW (ref >60)
GFR calc non Af Amer: 48 mL/min — ABNORMAL LOW (ref >60)
Glucose, Bld: 114 mg/dL — ABNORMAL HIGH (ref 65–99)
Potassium: 3.9 mmol/L (ref 3.5–5.1)
Sodium: 138 mmol/L (ref 135–145)
Total Bilirubin: 1.5 mg/dL — ABNORMAL HIGH (ref 0.3–1.2)
Total Protein: 6.9 g/dL (ref 6.5–8.1)

## 2017-03-31 LAB — I-STAT BETA HCG BLOOD, ED (MC, WL, AP ONLY): I-stat hCG, quantitative: 55.7 m[IU]/mL — ABNORMAL HIGH (ref <5)

## 2017-03-31 LAB — APTT: aPTT: 33 seconds (ref 24–36)

## 2017-03-31 LAB — I-STAT CG4 LACTIC ACID, ED
Lactic Acid, Venous: 2.34 mmol/L (ref 0.5–1.9)
Lactic Acid, Venous: 1.51 mmol/L (ref 0.5–1.9)

## 2017-03-31 LAB — PROCALCITONIN: Procalcitonin: 3.5 ng/mL

## 2017-03-31 MED ORDER — DEXTROSE 5 % IV SOLN
500.0000 mg | INTRAVENOUS | Status: DC
  Administered 2017-04-01: 500 mg via INTRAVENOUS
  Filled 2017-03-31 (×2): qty 500

## 2017-03-31 MED ORDER — ENOXAPARIN SODIUM 40 MG/0.4ML ~~LOC~~ SOLN
40.0000 mg | SUBCUTANEOUS | Status: DC
  Administered 2017-03-31 – 2017-04-01 (×2): 40 mg via SUBCUTANEOUS
  Filled 2017-03-31 (×2): qty 0.4

## 2017-03-31 MED ORDER — DEXTROSE 5 % IV SOLN
1.0000 g | INTRAVENOUS | Status: DC
  Administered 2017-04-01: 1 g via INTRAVENOUS
  Filled 2017-03-31 (×2): qty 10

## 2017-03-31 MED ORDER — IPRATROPIUM-ALBUTEROL 0.5-2.5 (3) MG/3ML IN SOLN
3.0000 mL | Freq: Once | RESPIRATORY_TRACT | Status: AC
  Administered 2017-03-31: 3 mL via RESPIRATORY_TRACT
  Filled 2017-03-31: qty 3

## 2017-03-31 MED ORDER — ACETAMINOPHEN 325 MG PO TABS: 650.0000 mg | ORAL_TABLET | Freq: Four times a day (QID) | ORAL | Status: DC | PRN

## 2017-03-31 MED ORDER — ASPIRIN EC 81 MG PO TBEC
81.0000 mg | DELAYED_RELEASE_TABLET | Freq: Every day | ORAL | Status: DC
  Administered 2017-04-01 – 2017-04-02 (×2): 81 mg via ORAL
  Filled 2017-03-31 (×2): qty 1

## 2017-03-31 MED ORDER — SODIUM CHLORIDE 0.9 % IV SOLN
INTRAVENOUS | Status: AC
  Administered 2017-03-31: 21:00:00 via INTRAVENOUS

## 2017-03-31 MED ORDER — ACETAMINOPHEN 650 MG RE SUPP: 650.0000 mg | Freq: Four times a day (QID) | RECTAL | Status: DC | PRN

## 2017-03-31 MED ORDER — ALBUTEROL SULFATE (2.5 MG/3ML) 0.083% IN NEBU
2.5000 mg | INHALATION_SOLUTION | Freq: Four times a day (QID) | RESPIRATORY_TRACT | Status: AC
  Administered 2017-03-31: 2.5 mg via RESPIRATORY_TRACT

## 2017-03-31 MED ORDER — TRAZODONE HCL 50 MG PO TABS: 25.0000 mg | ORAL_TABLET | Freq: Every evening | ORAL | Status: DC | PRN

## 2017-03-31 MED ORDER — NICOTINE 21 MG/24HR TD PT24
21.0000 mg | MEDICATED_PATCH | Freq: Every day | TRANSDERMAL | Status: DC
  Filled 2017-03-31: qty 1

## 2017-03-31 MED ORDER — SODIUM CHLORIDE 0.9 % IV BOLUS (SEPSIS)
1000.0000 mL | Freq: Once | INTRAVENOUS | Status: AC
  Administered 2017-03-31: 1000 mL via INTRAVENOUS

## 2017-03-31 MED ORDER — HYDROCODONE-ACETAMINOPHEN 5-325 MG PO TABS: 1.0000 | ORAL_TABLET | ORAL | Status: DC | PRN

## 2017-03-31 MED ORDER — BENZONATATE 100 MG PO CAPS
100.0000 mg | ORAL_CAPSULE | Freq: Two times a day (BID) | ORAL | Status: DC | PRN
  Administered 2017-04-01 – 2017-04-02 (×3): 100 mg via ORAL
  Filled 2017-03-31 (×3): qty 1

## 2017-03-31 MED ORDER — ONDANSETRON HCL 4 MG PO TABS: 4.0000 mg | ORAL_TABLET | Freq: Four times a day (QID) | ORAL | Status: DC | PRN

## 2017-03-31 MED ORDER — VITAMIN D 1000 UNITS PO TABS
1000.0000 [IU] | ORAL_TABLET | Freq: Every day | ORAL | Status: DC
  Administered 2017-04-01 – 2017-04-02 (×2): 1000 [IU] via ORAL
  Filled 2017-03-31 (×2): qty 1

## 2017-03-31 MED ORDER — DEXTROSE 5 % IV SOLN
500.0000 mg | Freq: Once | INTRAVENOUS | Status: AC
  Administered 2017-03-31: 500 mg via INTRAVENOUS
  Filled 2017-03-31: qty 500

## 2017-03-31 MED ORDER — ONDANSETRON HCL 4 MG/2ML IJ SOLN: 4.0000 mg | Freq: Four times a day (QID) | INTRAMUSCULAR | Status: DC | PRN

## 2017-03-31 MED ORDER — BUPROPION HCL ER (SR) 150 MG PO TB12: 150.0000 mg | ORAL_TABLET | Freq: Two times a day (BID) | ORAL | Status: DC

## 2017-03-31 MED ORDER — DEXTROSE 5 % IV SOLN
1.0000 g | Freq: Once | INTRAVENOUS | Status: AC
  Administered 2017-03-31: 1 g via INTRAVENOUS
  Filled 2017-03-31: qty 10

## 2017-03-31 MED ORDER — BISACODYL 5 MG PO TBEC: 5.0000 mg | DELAYED_RELEASE_TABLET | Freq: Every day | ORAL | Status: DC | PRN

## 2017-03-31 NOTE — Progress Notes (Signed)
Pharmacy Antibiotic Note  Sheri Perkins is a 57 y.o. female admitted on 03/31/2017 with CAP.  Pharmacy has been consulted for ceftriaxone and azithromycin dosing. Pt is afebrile. Pt was diagnosed with flu 2 weeks ago, and has been taking Tamiflu with minimal relief. Elevated WBC at 34.  Plan: Ceftriaxone 1gm IV q24h Azithromycin 500mg  IV q24h F/u clinical resolution and LOT Not renally adjusted - Pharmacy will sign off consult  Height: 5\' 5"  (165.1 cm) IBW/kg (Calculated) : 57  Temp (24hrs), Avg:99.3 F (37.4 C), Min:98.5 F (36.9 C), Max:100 F (37.8 C)  Recent Labs  Lab 03/31/17 1208 03/31/17 1236 03/31/17 1448  WBC 34.0*  --   --   CREATININE 1.22*  --   --   LATICACIDVEN  --  2.34* 1.51    CrCl cannot be calculated (Unknown ideal weight.).    Allergies  Allergen Reactions  . Macrobid [Nitrofurantoin Macrocrystal]   . Sulfa Antibiotics   . Sulfonamide Derivatives   . Latex Itching         Antimicrobials this admission: Ceftriaxone 1/9 >>  Azithromycin 1/9 >>   Microbiology results: Pending  Thank you for allowing pharmacy to be a part of this patient's care.  Aunisty Reali 03/31/2017 4:27 PM

## 2017-03-31 NOTE — ED Triage Notes (Signed)
Pt to ED with generalized weakness and body aches - reports she had positive flu test from her doctor last week and was supposed to go back to work today, but states she doesn't feel any better. Reports chills but no fevers, productive cough with mucus. Took Tamiflu. Resp e/u.

## 2017-03-31 NOTE — ED Notes (Signed)
Pt ambulated well to bathroom and back to room pt was 95% on room air

## 2017-03-31 NOTE — ED Notes (Signed)
Pt moved here from rm 18  C/o nausea on arrival  hhn given.  Pt alert oriented skin warm and  Dry  Husband went home  [place] oodered

## 2017-03-31 NOTE — H&P (Signed)
History and Physical    Sheri Perkins ZSM:[id number] DOB: 04-21-1960 DOA: 03/31/2017  PCP: Daphene Jaeger, PA-C Patient coming from: home  Chief Complaint: Shortness of breath  HPI: Sheri Perkins is a 57 y.o. female with medical history significant for tobacco use, hyperlipidemia, hypertension, artery stenosis since to the emergency department with worsening shortness of breath cough intermittent fever and chills. Initial evaluation included a chest x-ray concerning for multilobular pneumonia. [facility] are asked to admit  Formation is obtained from the patient and her husband who is at the bedside. Patient states she has been sick for 2 weeks with intermittent worsening shortness of breath cough intermittent chills body aches. She saw primary care provider to refill was diagnosed with the flu and completed a course of Tamiflu. She also took over-the-counter cold medicines with will relief. Morning her cough and shortness of breath was so bad she came to the emergency department. She denies chest pain palpitations diaphoresis nausea vomiting lower extremity edema. She denies headache dizziness syncope or near-syncope. She denies recent travel or sick contacts. She denies abdominal pain nausea vomiting diarrhea constipation melena or bright red blood per rectum. She denies any dysuria hematuria frequency or urgency. Does report that she continues to smoke. She is smoked for over 40 years.  ED Course: In the emergency department she is hypotensive, tachycardia, tachypnea max temp 100 orally. She is elevated lactic acid leukocytosis. Is provided with IV fluids nebulizer and antibiotics. At the time of admission she's hemodynamically stable and not hypoxic.  Review of Systems: As per HPI otherwise all other systems reviewed and are negative.   Ambulatory Status: Ambulates independently is independent with ADLs  Past Medical History:  Diagnosis Date  . Acute bronchiolitis   . Allergy    . Arthritis    fingers , back, neck   . Hyperlipidemia   . Renal artery stenosis (HCC) 04/03/2011   right 1-59% proximal reduction,left main 1-59% reduction,  . Renovascular hypertension   . Tobacco abuse     Past Surgical History:  Procedure Laterality Date  . arm surgery Bilateral   . CARPAL TUNNEL RELEASE Bilateral   . COLONOSCOPY    . HAND SURGERY    . KNEE SURGERY    . POLYPECTOMY    . SHOULDER SURGERY Left   . US ECHOCARDIOGRAPHY  06/16/2010   normal    Social History   Socioeconomic History  . Marital status: Married    Spouse name: Not on file  . Number of children: Not on file  . Years of education: Not on file  . Highest education level: Not on file  Social Needs  . Financial resource strain: Not on file  . Food insecurity - worry: Not on file  . Food insecurity - inability: Not on file  . Transportation needs - medical: Not on file  . Transportation needs - non-medical: Not on file  Occupational History  . Not on file  Tobacco Use  . Smoking status: Current Some Day Smoker    Packs/day: 0.12    Types: Cigarettes  . Smokeless tobacco: Never Used  Substance and Sexual Activity  . Alcohol use: No    Alcohol/week: 0.0 oz  . Drug use: No  . Sexual activity: Not on file  Other Topics Concern  . Not on file  Social History Narrative  . Not on file    Allergies  Allergen Reactions  . Macrobid [Nitrofurantoin Macrocrystal] Other (See Comments)    "burned lungs"  .  Latex Itching       . Sulfa Antibiotics Hives and Rash    Family History  Problem Relation Age of Onset  . Melanoma Sister   . Colon cancer Neg Hx   . Colon polyps Neg Hx   . Rectal cancer Neg Hx   . Stomach cancer Neg Hx     Prior to Admission medications   Medication Sig Start Date End Date Taking? Authorizing Provider  ALPRAZolam Duanne Moron) 0.5 MG tablet Take 0.5 mg by mouth 2 (two) times daily as needed.     [provider]  aspirin 81 MG tablet Take 81 mg by mouth  daily.    [provider]  buPROPion (WELLBUTRIN SR) 150 MG 12 hr tablet Take 1 tablet (150 mg total) by mouth 2 (two) times daily. 05/01/15   Croitoru, Mihai, MD  cholecalciferol (VITAMIN D) 1000 UNITS tablet Take 1,000 Units by mouth daily.    [provider]  co-enzyme Q-10 30 MG capsule Take 30 mg by mouth daily.    [provider]  losartan (COZAAR) 50 MG tablet TAKE 1/2 (ONE-HALF) TABLET BY MOUTH TWICE DAILY 08/03/16   Croitoru, Dani Gobble, MD  meloxicam (MOBIC) 7.5 MG tablet Take 1-2 tablets daily with food for her hand pain 10/07/11   Darlyne Russian, MD  PRESCRIPTION MEDICATION Take 1 tablet by mouth at bedtime. Cholesterol medicine- pt unaware of name of medicine    [provider]    Physical Exam: Vitals:   03/31/17 1445 03/31/17 1450 03/31/17 1530 03/31/17 1545  BP: 102/66 102/66 (!) 110/58 103/70  Pulse: 99 95 94 95  Resp: (!) 26 (!) 27 (!) 36 (!) 27  Temp:      TempSrc:      SpO2: 95% 95% 91% 93%  Height:         General:  Appears ill with flushed face, is calm cooperative in no acute distress Eyes:  PERRL, EOMI, normal lids, iris ENT:  grossly normal hearing, lips & tongue, his membranes of her mouth are moist and pink Neck:  no LAD, masses or thyromegaly Cardiovascular:  RRR, no m/r/g. No LE edema. Pedal pulses present and palpable Respiratory:  Mild increased work of bleeding with conversation. Breath sounds are distant. No crackles no wheezes Abdomen:  soft, ntnd, positive bowel sounds throughout no guarding or rebounding Skin:  no rash or induration seen on limited exam Musculoskeletal:  grossly normal tone BUE/BLE, good ROM, no bony abnormality Psychiatric:  grossly normal mood and affect, speech fluent and appropriate, AOx3 Neurologic:  CN 2-12 grossly intact, moves all extremities in coordinated fashion, sensation intact speech clear facial symmetry  Labs on Admission: I have personally reviewed following labs and imaging  studies  CBC: Recent Labs  Lab 03/31/17 1208  WBC 34.0*  NEUTROABS 30.6*  HGB 12.8  HCT 38.7  MCV 92.4  PLT 867   Basic Metabolic Panel: Recent Labs  Lab 03/31/17 1208  NA 138  K 3.9  CL 103  CO2 24  GLUCOSE 114*  BUN 24*  CREATININE 1.22*  CALCIUM 8.5*   GFR: CrCl cannot be calculated (Unknown ideal weight.). Liver Function Tests: Recent Labs  Lab 03/31/17 1208  AST 18  ALT 13*  ALKPHOS 144*  BILITOT 1.5*  PROT 6.9  ALBUMIN 2.8*   No results for input(s): LIPASE, AMYLASE in the last 168 hours. No results for input(s): AMMONIA in the last 168 hours. Coagulation Profile: No results for input(s): INR, PROTIME in the last  168 hours. Cardiac Enzymes: No results for input(s): CKTOTAL, CKMB, CKMBINDEX, TROPONINI in the last 168 hours. BNP (last 3 results) No results for input(s): PROBNP in the last 8760 hours. HbA1C: No results for input(s): HGBA1C in the last 72 hours. CBG: No results for input(s): GLUCAP in the last 168 hours. Lipid Profile: No results for input(s): CHOL, HDL, LDLCALC, TRIG, CHOLHDL, LDLDIRECT in the last 72 hours. Thyroid Function Tests: No results for input(s): TSH, T4TOTAL, FREET4, T3FREE, THYROIDAB in the last 72 hours. Anemia Panel: No results for input(s): VITAMINB12, FOLATE, FERRITIN, TIBC, IRON, RETICCTPCT in the last 72 hours. Urine analysis: No results found for: COLORURINE, APPEARANCEUR, LABSPEC, PHURINE, GLUCOSEU, HGBUR, BILIRUBINUR, KETONESUR, PROTEINUR, UROBILINOGEN, NITRITE, LEUKOCYTESUR  Creatinine Clearance: CrCl cannot be calculated (Unknown ideal weight.).  Sepsis Labs: _0 (procalcitonin:4,lacticidven:4) )No results found for this or any previous visit (from the past 240 hour(s)).   Radiological Exams on Admission: Dg Chest 2 View  Result Date: 03/31/2017 CLINICAL DATA:  Flu symptoms. EXAM: CHEST  2 VIEW COMPARISON:  chest x-ray dated 05/14/2006 FINDINGS: The patient has a streaky infiltrate in the lingula of  the left upper lobe and a more prominent infiltrate in right middle lobe. There is also atelectasis and slight streaky infiltrate in the right upper lobe. The heart size and pulmonary vascularity are normal. No effusions. Bones are normal. IMPRESSION: Bilateral pneumonia involving right upper lobe, right middle lobe, and left upper lobe. Electronically Signed   By: Lorriane Shire M.D.   On: 03/31/2017 12:54    EKG: Independently reviewed. Ventricular-paced complexes  Assessment/Plan Principal Problem:   Respiratory distress Active Problems:   Tobacco abuse   Hyperlipidemia   AKI (acute kidney injury) ([place])   Sepsis (HCC)   Leukocytosis   CAP (community acquired pneumonia)   Elevated transaminase level   #1. Acute respiratory distress likely related to community-acquired pneumonia in the setting of those likely a chronic bronchitis of a 40 year plus smoker. Chest x-ray with bilateral pneumonia involving right upper lobe right middle lobe and left upper lobe. Oxygen saturation level 91% on room air respiratory rate 36. She's provided with nebulizers and oxygen supplementation in the emergency department -Admit to telemetry inpatient -Continue oxygen supplementation -Continue nebulizers -IV antibiotics per pneumonia protocol -Monitor oxygen saturation level -Wean oxygen as able  #2. Community-acquired pneumonia. Chest x-ray with multilobular pneumonia. Leukocytosis of 34.  Unclear whether or not she was provided with steroids 2 weeks ago by her PCP. She did report she took tamiflu for 10 days. Max temp is 100. sHe had an elevated lactic acid and hypotension which resolved with IV fluids. Also concern for malignancy given her long history of smoking -IV antibiotics per pneumonia protocol -Obtain strep pneumo urine antigen -Obtain sputum culture if able -CT chest  -See #1  #2. Sepsis. Related to #2. Patient hypotensive tachycardia tachypnea elevated lactic acid with acute kidney injury.  She received IV fluids IV antibiotics at the time of admission hemodynamically stable nontoxic appearing -Follow blood cultures -follow urinalysis -Continue IV fluids -Continue IV antibiotics -Track lactic acid -Monitor closely  #3. Acute kidney injury. Likely related to above. Creatinine 1.22. Vision with decreased oral intake over the last several days. -IV fluids as noted above -Hold nephrotoxins -Monitor urine output -Recheck in the morning  #4. Hypertension. Patient was hypotensive upon presentation. He received 2 L of normal saline at the time of admission blood pressure is stable. Home medications include Cozaar -We will hold Cozaar -Continue IV fluids -Monitor  5.  Tobacco abuse -Cessation counseling offered  #6. Elevated transaminase. Mild. Total bili 1.5. Alk phos 144. Likely reactive -monitor   DVT prophylaxis: lovenox  Code Status: full  Family Communication: husband at bedside  Disposition Plan: home  Consults called: none  Admission status: inpatient    Radene Gunning MD [facility]  If 7PM-7AM, please contact night-coverage [email or web address] Password [facility]  03/31/2017, 4:43 PM

## 2017-03-31 NOTE — ED Provider Notes (Signed)
[place] EMERGENCY DEPARTMENT Provider Note   CSN: [id number] Arrival date & time: 03/31/17  1045     History   Chief Complaint Chief Complaint  Patient presents with  . Influenza    HPI Sheri Perkins is a 57 y.o. female with history of hypertension and tobacco abuse presents for evaluation of increased work of breathing, cough, generalized malaise, chills, body aches for 2 weeks acutely worsening over the last 2-3 days.  Breathing more labored with walking. Also endorsing diffuse, lower, anterior chest wall pain worse with movement and coughing. Was diagnosed with the flu 2 weeks ago at PCP office and has been taking Tamiflu and over-the-counter cold medicines with minimal relief. Denies exertional chest pain, palpitations, dizziness, fevers, abdominal pain, nausea, vomiting, diarrhea, urinary symptoms.   HPI  Past Medical History:  Diagnosis Date  . Acute bronchiolitis   . Allergy   . Arthritis    fingers , back, neck   . Hyperlipidemia   . Renal artery stenosis (HCC) 04/03/2011   right 1-59% proximal reduction,left main 1-59% reduction,  . Renovascular hypertension   . Tobacco abuse     Patient Active Problem List   Diagnosis Date Noted  . AKI (acute kidney injury) ([place]) 03/31/2017  . Sepsis ([place]) 03/31/2017  . Acute respiratory failure ([place]) 03/31/2017  . Neurocardiogenic syncope 07/27/2016  . Renovascular hypertension 08/12/2012  . Tobacco abuse 08/12/2012  . Hyperlipidemia 08/12/2012  . Encounter for long-term (current) use of other medications 08/12/2012  . CHANGE IN BOWELS 12/23/2009  . PERSONAL HISTORY OF COLONIC POLYPS 12/23/2009    Past Surgical History:  Procedure Laterality Date  . arm surgery Bilateral   . CARPAL TUNNEL RELEASE Bilateral   . COLONOSCOPY    . HAND SURGERY    . KNEE SURGERY    . POLYPECTOMY    . SHOULDER SURGERY Left   . US ECHOCARDIOGRAPHY  06/16/2010   normal    OB History    No data available        Home Medications    Prior to Admission medications   Medication Sig Start Date End Date Taking? Authorizing Provider  ALPRAZolam Duanne Moron) 0.5 MG tablet Take 0.5 mg by mouth 2 (two) times daily as needed.     [provider]  aspirin 81 MG tablet Take 81 mg by mouth daily.    [provider]  buPROPion (WELLBUTRIN SR) 150 MG 12 hr tablet Take 1 tablet (150 mg total) by mouth 2 (two) times daily. 05/01/15   Croitoru, Mihai, MD  cholecalciferol (VITAMIN D) 1000 UNITS tablet Take 1,000 Units by mouth daily.    [provider]  co-enzyme Q-10 30 MG capsule Take 30 mg by mouth daily.    [provider]  losartan (COZAAR) 50 MG tablet TAKE 1/2 (ONE-HALF) TABLET BY MOUTH TWICE DAILY 08/03/16   Croitoru, Dani Gobble, MD  meloxicam (MOBIC) 7.5 MG tablet Take 1-2 tablets daily with food for her hand pain 10/07/11   Darlyne Russian, MD  PRESCRIPTION MEDICATION Take 1 tablet by mouth at bedtime. Cholesterol medicine- pt unaware of name of medicine    [provider]    Family History Family History  Problem Relation Age of Onset  . Melanoma Sister   . Colon cancer Neg Hx   . Colon polyps Neg Hx   . Rectal cancer Neg Hx   . Stomach cancer Neg Hx     Social History Social History   Tobacco Use  .  Smoking status: Current Some Day Smoker    Packs/day: 0.12    Types: Cigarettes  . Smokeless tobacco: Never Used  Substance Use Topics  . Alcohol use: No    Alcohol/week: 0.0 oz  . Drug use: No     Allergies   Macrobid [nitrofurantoin macrocrystal]; Sulfa antibiotics; Sulfonamide derivatives; and Latex   Review of Systems Review of Systems  Constitutional: Positive for chills and fatigue.  Respiratory: Positive for cough, chest tightness and shortness of breath.   Cardiovascular: Positive for chest pain (with coughing).  All other systems reviewed and are negative.    Physical Exam Updated Vital Signs BP 103/70   Pulse 95   Temp 100 F (37.8  C) (Oral)   Resp (!) 27   Ht 5\' 5"  (1.651 m)   SpO2 93%   BMI 25.96 kg/m   Physical Exam  Constitutional: She is oriented to person, place, and time. She appears well-developed and well-nourished. No distress.  Non toxic  HENT:  Head: Normocephalic and atraumatic.  Nose: Nose normal.  Mouth/Throat: No oropharyngeal exudate.  Dry mucous membranes . Oropharynx and tonsils normal.  Eyes: Conjunctivae and EOM are normal. Pupils are equal, round, and reactive to light.  Neck: Normal range of motion.  Bilateral submandibular lymphadenopathy.  Cardiovascular: Normal rate, regular rhythm and intact distal pulses.  No murmur heard. BP 106/65. HR 98. 2+ DP and radial pulses bilaterally. No LE edema.   Pulmonary/Chest: No respiratory distress. She has wheezes. She exhibits tenderness.  92% on RA. Coarse lung sounds in lower lobes bilaterally.  Anterior rib tenderness bilaterally. No crackles. No increased work of breathing at rest.   Abdominal: Soft. Bowel sounds are normal. There is no tenderness.  No G/R/R. No suprapubic or CVA tenderness.   Musculoskeletal: Normal range of motion. She exhibits no deformity.  Neurological: She is alert and oriented to person, place, and time.  Skin: Skin is warm and dry. Capillary refill takes less than 2 seconds.  Psychiatric: She has a normal mood and affect. Her behavior is normal. Judgment and thought content normal.  Nursing note and vitals reviewed.    ED Treatments / Results  Labs (all labs ordered are listed, but only abnormal results are displayed) Labs Reviewed  COMPREHENSIVE METABOLIC PANEL - Abnormal; Notable for the following components:      Result Value   Glucose, Bld 114 (*)    BUN 24 (*)    Creatinine, Ser 1.22 (*)    Calcium 8.5 (*)    Albumin 2.8 (*)    ALT 13 (*)    Alkaline Phosphatase 144 (*)    Total Bilirubin 1.5 (*)    GFR calc non Af Amer 48 (*)    GFR calc Af Amer 55 (*)    All other components within normal limits   CBC WITH DIFFERENTIAL/PLATELET - Abnormal; Notable for the following components:   WBC 34.0 (*)    Neutro Abs 30.6 (*)    Monocytes Absolute 1.7 (*)    All other components within normal limits  I-STAT CG4 LACTIC ACID, ED - Abnormal; Notable for the following components:   Lactic Acid, Venous 2.34 (*)    All other components within normal limits  I-STAT BETA HCG BLOOD, ED ([facility], [facility], [facility] ONLY) - Abnormal; Notable for the following components:   I-stat hCG, quantitative 55.7 (*)    All other components within normal limits  CULTURE, BLOOD (ROUTINE X 2)  CULTURE, BLOOD (ROUTINE X 2)  RESPIRATORY PANEL  BY PCR  CULTURE, EXPECTORATED SPUTUM-ASSESSMENT  URINALYSIS, ROUTINE W REFLEX MICROSCOPIC  PROCALCITONIN  PROTIME-INR  APTT  HIV ANTIBODY (ROUTINE TESTING)  STREP PNEUMONIAE URINARY ANTIGEN  I-STAT CG4 LACTIC ACID, ED    EKG  EKG Interpretation None       Radiology Dg Chest 2 View  Result Date: 03/31/2017 CLINICAL DATA:  Flu symptoms. EXAM: CHEST  2 VIEW COMPARISON:  chest x-ray dated 05/14/2006 FINDINGS: The patient has a streaky infiltrate in the lingula of the left upper lobe and a more prominent infiltrate in right middle lobe. There is also atelectasis and slight streaky infiltrate in the right upper lobe. The heart size and pulmonary vascularity are normal. No effusions. Bones are normal. IMPRESSION: Bilateral pneumonia involving right upper lobe, right middle lobe, and left upper lobe. Electronically Signed   By: Lorriane Shire M.D.   On: 03/31/2017 12:54    Procedures Procedures (including critical care time)  Medications Ordered in ED Medications  cefTRIAXone (ROCEPHIN) 1 g in dextrose 5 % 50 mL IVPB (1 g Intravenous New Bag/Given 03/31/17 1600)  azithromycin (ZITHROMAX) 500 mg in dextrose 5 % 250 mL IVPB (not administered)  aspirin tablet 81 mg (not administered)  buPROPion (WELLBUTRIN SR) 12 hr tablet 150 mg (not administered)  cholecalciferol (VITAMIN D) tablet 1,000  Units (not administered)  cefTRIAXone (ROCEPHIN) 1 g in dextrose 5 % 50 mL IVPB (not administered)  azithromycin (ZITHROMAX) 500 mg in dextrose 5 % 250 mL IVPB (not administered)  enoxaparin (LOVENOX) injection 40 mg (not administered)  0.9 %  sodium chloride infusion (not administered)  acetaminophen (TYLENOL) tablet 650 mg (not administered)    Or  acetaminophen (TYLENOL) suppository 650 mg (not administered)  HYDROcodone-acetaminophen (NORCO/VICODIN) 5-325 MG per tablet 1-2 tablet (not administered)  traZODone (DESYREL) tablet 25 mg (not administered)  ondansetron (ZOFRAN) tablet 4 mg (not administered)    Or  ondansetron (ZOFRAN) injection 4 mg (not administered)  bisacodyl (DULCOLAX) EC tablet 5 mg (not administered)  albuterol (PROVENTIL) (2.5 MG/3ML) 0.083% nebulizer solution 2.5 mg (not administered)  sodium chloride 0.9 % bolus 1,000 mL (1,000 mLs Intravenous New Bag/Given 03/31/17 1559)  sodium chloride 0.9 % bolus 1,000 mL (1,000 mLs Intravenous New Bag/Given 03/31/17 1559)  ipratropium-albuterol (DUONEB) 0.5-2.5 (3) MG/3ML nebulizer solution 3 mL (3 mLs Nebulization Given 03/31/17 1600)     Initial Impression / Assessment and Plan / ED Course  I have reviewed the triage vital signs and the nursing notes.  Pertinent labs & imaging results that were available during my care of the patient were reviewed by me and considered in my medical decision making (see chart for details).  Clinical Course as of Mar 31 1606  Wed Mar 31, 2017  1340 Pulse Rate: (!) 101 [CG]  1340 BP: (!) 85/58 [CG]  1340 IMPRESSION: Bilateral pneumonia involving right upper lobe, right middle lobe, and left upper lobe. DG Chest 2 View [CG]  1341 Lactic Acid, Venous: (!!) 2.34 [CG]  1341 Creatinine: (!) 1.22 [CG]  1407 WBC: (!) 34.0 [CG]  1502 Reevaluated patient. Blood pressure 106/70, 96% on room air at rest. We'll plan to ambulate.  [CG]    Clinical Course User Index [CG] Kinnie Feil, PA-C    57 year old female presents for increased work of breathing, following influenza diagnosis 2 weeks ago and Tamiflu.  She was hypotensive at triage with 92-93% on room air, improving. Coarse lung sounds with wheezing mostly at lower lobes. She does not look fluid overloaded. She has exertional  dyspnea.   Final Clinical Impressions(s) / ED Diagnoses   CXR with multi-focal pneumonia. WBC 34. Initially lactic 2.34, resolved after IVF.   Will request admission for multifocal CAP. Pending abx, IVF, duoneb, U/A. Pt shared with SP who evaluated pt and agrees with ED tx and disposition.   Final diagnoses:  Community acquired pneumonia, unspecified laterality    ED Discharge Orders    None       Arlean Hopping 03/31/17 1633    Virgel Manifold, MD 03/31/17 1649

## 2017-04-01 ENCOUNTER — Other Ambulatory Visit: Payer: Self-pay

## 2017-04-01 DIAGNOSIS — R0603 Acute respiratory distress: Secondary | ICD-10-CM

## 2017-04-01 LAB — HIV ANTIBODY (ROUTINE TESTING W REFLEX): HIV Screen 4th Generation wRfx: NONREACTIVE

## 2017-04-01 LAB — RESPIRATORY PANEL BY PCR

## 2017-04-01 LAB — BASIC METABOLIC PANEL
ANION GAP: 8 (ref 5–15)
BUN: 13 mg/dL (ref 6–20)
CHLORIDE: 110 mmol/L (ref 101–111)
CO2: 23 mmol/L (ref 22–32)
Calcium: 8.2 mg/dL — ABNORMAL LOW (ref 8.9–10.3)
Creatinine, Ser: 0.75 mg/dL (ref 0.44–1.00)
GFR calc Af Amer: >60 mL/min (ref >60)
Glucose, Bld: 102 mg/dL — ABNORMAL HIGH (ref 65–99)
POTASSIUM: 3.5 mmol/L (ref 3.5–5.1)
Sodium: 141 mmol/L (ref 135–145)

## 2017-04-01 LAB — CBC
HEMATOCRIT: 31.7 % — AB (ref 36.0–46.0)
HEMOGLOBIN: 10.2 g/dL — AB (ref 12.0–15.0)
MCH: 29.7 pg (ref 26.0–34.0)
MCHC: 32.2 g/dL (ref 30.0–36.0)
MCV: 92.2 fL (ref 78.0–100.0)
Platelets: 211 10*3/uL (ref 150–400)
RBC: 3.44 MIL/uL — ABNORMAL LOW (ref 3.87–5.11)
RDW: 14.3 % (ref 11.5–15.5)
WBC: 15 10*3/uL — AB (ref 4.0–10.5)

## 2017-04-01 MED ORDER — IPRATROPIUM-ALBUTEROL 0.5-2.5 (3) MG/3ML IN SOLN
3.0000 mL | Freq: Four times a day (QID) | RESPIRATORY_TRACT | Status: DC | PRN
  Filled 2017-04-01 (×2): qty 3

## 2017-04-01 NOTE — Progress Notes (Addendum)
PROGRESS NOTE    Sheri Perkins  PYK:[id number] DOB: [birth year] DOA: 03/31/2017 PCP: Sheri Jaeger, PA-C   Brief Narrative: Patient is a 57 year old-year-old female with past medical history of  smoking, hyperlipidemia, hypertension who presented to the emergency department with worsening shortness of breath, cough, intermittent fever and chills.  Patient was recently treated for influenza.  Chest x-ray done on admission was concerning for multi lobar pneumonia. CT chest showed right middle lobe pneumonia .  Assessment & Plan:   Principal Problem:   Respiratory distress Active Problems:   Tobacco abuse   Hyperlipidemia   AKI (acute kidney injury) ([place])   Sepsis (HCC)   Leukocytosis   CAP (community acquired pneumonia)   Elevated transaminase level   Influenza with pneumonia  Acute respiratory distress/Community acquired  pneumonia: Post influenza pneumonia. Chest x-ray was positive of multilobar pneumonia.  CT chest showed right middle lobe consolidation. Respiratory status much improved this morning.  Currently she is saturating fine on room air. Started on azithromycin and ceftraixone.  Continue nebulizers as necessary.  Continue supplemental oxygen as necessary Patient was recently treated with Tamiflu for 10 days for influenza.  Leukocytosis: Trending down with antibiotics.  We will continue to monitor.  Sepsis: Elevated lactic acid on presentation.  Patient was also hypotensive on admission.  Lactic acid has resolved with IV fluids.  Acute kidney injury: Started on IV fluids.  Improvement of acute kidney injury  Hypertension: Patient was hypotensive on presentation.  Bolused with IV fluids.  Currently blood pressure stable..  Tobacco abuse: Counseled for smoking cessation     DVT prophylaxis:Lovenox Code Status: Full Family Communication: Husband at the bedside Disposition Plan: Tomorrow to home   Consultants: None  Procedures: None  Antimicrobials:  Azithromycin and ceftriaxone since 03/31/17  Subjective:  Patient seen and examined the bedside this morning.  Remains comfortable.  Not in respiratory distress.  Saturating normally on room air.  Complains of cough Objective: Vitals:   03/31/17 1930 03/31/17 2045 04/01/17 0544 04/01/17 0954  BP: 103/60 (!) 106/58 (!) 111/59 130/85  Pulse: 91 86 74 75  Resp: (!) 26 20 20 19   Temp:  98.5 F (36.9 C) 97.6 F (36.4 C) 98 F (36.7 C)  TempSrc:  Oral Oral Oral  SpO2: 91% 96% 97% 96%  Weight:  63.8 kg (140 lb 9.6 oz)    Height:  5' (1.524 m)      Intake/Output Summary (Last 24 hours) at 04/01/2017 1517 Last data filed at 04/01/2017 1156 Gross per 24 hour  Intake 3051.25 ml  Output 0 ml  Net 3051.25 ml   Filed Weights   03/31/17 2045  Weight: 63.8 kg (140 lb 9.6 oz)    Examination:  General exam: Appears calm and comfortable ,Not in distress,average built Respiratory system: Bilateral equal air entry, normal vesicular breath sounds, no wheezes or crackles  Cardiovascular system: S1 & S2 heard, RRR. No JVD, murmurs, rubs, gallops or clicks. No pedal edema. Gastrointestinal system: Abdomen is nondistended, soft and nontender. No organomegaly or masses felt. Normal bowel sounds heard. Central nervous system: Alert and oriented. No focal neurological deficits. Extremities: No edema, no clubbing ,no cyanosis, distal peripheral pulses palpable. Skin: No rashes, lesions or ulcers,no icterus ,no pallor Psychiatry: Judgement and insight appear normal. Mood & affect appropriate.     Data Reviewed: I have personally reviewed following labs and imaging studies  CBC: Recent Labs  Lab 03/31/17 1208 04/01/17 0841  WBC 34.0* 15.0*  NEUTROABS 30.6*  --  HGB 12.8 10.2*  HCT 38.7 31.7*  MCV 92.4 92.2  PLT 238 109   Basic Metabolic Panel: Recent Labs  Lab 03/31/17 1208 04/01/17 0841  NA 138 141  K 3.9 3.5  CL 103 110  CO2 24 23  GLUCOSE 114* 102*  BUN 24* 13  CREATININE 1.22*  0.75  CALCIUM 8.5* 8.2*   GFR: Estimated Creatinine Clearance: 63.9 mL/min (by C-G formula based on SCr of 0.75 mg/dL). Liver Function Tests: Recent Labs  Lab 03/31/17 1208  AST 18  ALT 13*  ALKPHOS 144*  BILITOT 1.5*  PROT 6.9  ALBUMIN 2.8*   No results for input(s): LIPASE, AMYLASE in the last 168 hours. No results for input(s): AMMONIA in the last 168 hours. Coagulation Profile: Recent Labs  Lab 03/31/17 2114  INR 1.29   Cardiac Enzymes: No results for input(s): CKTOTAL, CKMB, CKMBINDEX, TROPONINI in the last 168 hours. BNP (last 3 results) No results for input(s): PROBNP in the last 8760 hours. HbA1C: No results for input(s): HGBA1C in the last 72 hours. CBG: No results for input(s): GLUCAP in the last 168 hours. Lipid Profile: No results for input(s): CHOL, HDL, LDLCALC, TRIG, CHOLHDL, LDLDIRECT in the last 72 hours. Thyroid Function Tests: No results for input(s): TSH, T4TOTAL, FREET4, T3FREE, THYROIDAB in the last 72 hours. Anemia Panel: No results for input(s): VITAMINB12, FOLATE, FERRITIN, TIBC, IRON, RETICCTPCT in the last 72 hours. Sepsis Labs: Recent Labs  Lab 03/31/17 1236 03/31/17 1448 03/31/17 2114  PROCALCITON  --   --  3.50  LATICACIDVEN 2.34* 1.51  --     Recent Results (from the past 240 hour(s))  Blood Culture (routine x 2)     Status: None (Preliminary result)   Collection Time: 03/31/17  2:39 PM  Result Value Ref Range Status   Specimen Description BLOOD RIGHT HAND  Final   Special Requests   Final    BOTTLES DRAWN AEROBIC AND ANAEROBIC Blood Culture adequate volume   Culture NO GROWTH < 24 HOURS  Final   Report Status PENDING  Incomplete  Blood Culture (routine x 2)     Status: None (Preliminary result)   Collection Time: 03/31/17  3:02 PM  Result Value Ref Range Status   Specimen Description BLOOD RIGHT ANTECUBITAL  Final   Special Requests   Final    BOTTLES DRAWN AEROBIC AND ANAEROBIC Blood Culture adequate volume   Culture NO  GROWTH < 24 HOURS  Final   Report Status PENDING  Incomplete  Respiratory Panel by PCR     Status: None   Collection Time: 03/31/17  8:44 PM  Result Value Ref Range Status   Adenovirus NOT DETECTED NOT DETECTED Final   Coronavirus 229E NOT DETECTED NOT DETECTED Final   Coronavirus HKU1 NOT DETECTED NOT DETECTED Final   Coronavirus NL63 NOT DETECTED NOT DETECTED Final   Coronavirus OC43 NOT DETECTED NOT DETECTED Final   Metapneumovirus NOT DETECTED NOT DETECTED Final   Rhinovirus / Enterovirus NOT DETECTED NOT DETECTED Final   Influenza A NOT DETECTED NOT DETECTED Final   Influenza B NOT DETECTED NOT DETECTED Final   Parainfluenza Virus 1 NOT DETECTED NOT DETECTED Final   Parainfluenza Virus 2 NOT DETECTED NOT DETECTED Final   Parainfluenza Virus 3 NOT DETECTED NOT DETECTED Final   Parainfluenza Virus 4 NOT DETECTED NOT DETECTED Final   Respiratory Syncytial Virus NOT DETECTED NOT DETECTED Final   Bordetella pertussis NOT DETECTED NOT DETECTED Final   Chlamydophila pneumoniae NOT DETECTED NOT DETECTED  Final   Mycoplasma pneumoniae NOT DETECTED NOT DETECTED Final         Radiology Studies: Dg Chest 2 View  Result Date: 03/31/2017 CLINICAL DATA:  Flu symptoms. EXAM: CHEST  2 VIEW COMPARISON:  chest x-ray dated 05/14/2006 FINDINGS: The patient has a streaky infiltrate in the lingula of the left upper lobe and a more prominent infiltrate in right middle lobe. There is also atelectasis and slight streaky infiltrate in the right upper lobe. The heart size and pulmonary vascularity are normal. No effusions. Bones are normal. IMPRESSION: Bilateral pneumonia involving right upper lobe, right middle lobe, and left upper lobe. Electronically Signed   By: Lorriane Shire M.D.   On: 03/31/2017 12:54   Ct Chest Wo Contrast  Result Date: 03/31/2017 CLINICAL DATA:  Dyspnea.  Recent flu diagnosis. EXAM: CT CHEST WITHOUT CONTRAST TECHNIQUE: Multidetector CT imaging of the chest was performed following  the standard protocol without IV contrast. COMPARISON:  Chest x-ray earlier today FINDINGS: Cardiovascular: Normal heart size. No pericardial effusion. Atherosclerotic calcification of the aorta, coronaries, great vessels. Scattered pericardial calcifications without thickening seen elsewhere. No pericardial fluid. Mediastinum/Nodes: Right mediastinal and subcarinal adenopathy presumably reactive in this setting. Lungs/Pleura: Generalized airway thickening with patchy atelectatic and ground-glass type opacities. There is consolidation throughout most of the right middle lobe. No effusion or cavitation. Upper Abdomen: No acute finding Musculoskeletal: No acute finding IMPRESSION: 1. Right middle lobe pneumonia. 2. Background of generalized airway thickening, atelectasis, and possible pneumonitis in this patient with history of recent flu. 3. Aortic Atherosclerosis (ICD10-I70.0).  Coronary atherosclerosis. 4. Pericardial calcifications without effusion. 5. Mediastinal adenopathy, presumably reactive in this setting. Electronically Signed   By: Monte Fantasia M.D.   On: 03/31/2017 17:17        Scheduled Meds: . aspirin EC  81 mg Oral Daily  . cholecalciferol  1,000 Units Oral Daily  . enoxaparin (LOVENOX) injection  40 mg Subcutaneous Q24H  . nicotine  21 mg Transdermal Daily   Continuous Infusions: . sodium chloride 75 mL/hr at 03/31/17 2048  . azithromycin    . cefTRIAXone (ROCEPHIN)  IV       LOS: 1 day    Time spent: 25 minutes    Kedarius Aloisi Jodie Echevaria, MD [facility] Pager [phone]  If 7PM-7AM, please contact night-coverage [email or web address] Password [email or web address] 04/01/2017, 3:17 PM

## 2017-04-01 NOTE — Progress Notes (Signed)
New Admission Note:  Arrival Method: Via stretcher form ED Mental Orientation: Alert & Oriented x4 Telemetry: CCMD verified;  Assessment: Completed Skin: Refer to flowsheet  IV: Right Hand Pain: 0/10 Safety Measures: Safety Fall Prevention Plan discussed with patient. Admission: Completed [facility] [place] Orientation: Patient has been orientated to the room, unit and the staff.  Orders have been reviewed and implemented. Will continue to monitor the patient. Call light has been placed within reach.   Vassie Moselle, RN  Phone Number: [phone]

## 2017-04-02 LAB — CBC WITH DIFFERENTIAL/PLATELET
BASOS PCT: 1 %
Basophils Absolute: 0.1 10*3/uL (ref 0.0–0.1)
EOS ABS: 0.3 10*3/uL (ref 0.0–0.7)
Eosinophils Relative: 3 %
HCT: 33.8 % — ABNORMAL LOW (ref 36.0–46.0)
Hemoglobin: 11.1 g/dL — ABNORMAL LOW (ref 12.0–15.0)
Lymphocytes Relative: 21 %
Lymphs Abs: 1.9 10*3/uL (ref 0.7–4.0)
MCH: 29.8 pg (ref 26.0–34.0)
MCHC: 32.8 g/dL (ref 30.0–36.0)
MCV: 90.9 fL (ref 78.0–100.0)
Monocytes Absolute: 1.1 10*3/uL — ABNORMAL HIGH (ref 0.1–1.0)
Monocytes Relative: 11 %
Neutro Abs: 6.1 10*3/uL (ref 1.7–7.7)
Neutrophils Relative %: 64 %
PLATELETS: 239 10*3/uL (ref 150–400)
RBC: 3.72 MIL/uL — AB (ref 3.87–5.11)
RDW: 14.2 % (ref 11.5–15.5)
WBC: 9.4 10*3/uL (ref 4.0–10.5)

## 2017-04-02 MED ORDER — BENZONATATE 100 MG PO CAPS: 100.0000 mg | ORAL_CAPSULE | Freq: Two times a day (BID) | ORAL | 0 refills | Status: DC | PRN

## 2017-04-02 MED ORDER — LEVOFLOXACIN 750 MG PO TABS
750.0000 mg | ORAL_TABLET | Freq: Every day | ORAL | Status: DC
  Administered 2017-04-02: 750 mg via ORAL
  Filled 2017-04-02: qty 1

## 2017-04-02 MED ORDER — LEVOFLOXACIN 750 MG PO TABS: 750.0000 mg | ORAL_TABLET | Freq: Every day | ORAL | 0 refills | Status: DC

## 2017-04-02 NOTE — Progress Notes (Addendum)
Bene check sent for  benzonatate (TESSALON) capsule 100 mg  Dose: 100 mg Freq: 2 times daily PRN Route: PO  Jacqualin Combes, RN        # 1. S/W  EDIENA @ [facility] # [phone]    BENZONATATE CAPSULE 100 MG  COVER- YES  CO-PAY- $ 11.00  TIER- 1 DRUG  PRIOR APPROVAL- NO   TESSALON: NONE FORMULARY

## 2017-04-02 NOTE — Care Management Note (Signed)
Case Management Note  Patient Details  Name: Sheri Perkins MRN: [id number] Date of Birth: [birth year]  Subjective/Objective:                 Sheri Perkins is a 57 y.o. female with medical history significant for tobacco use, hyperlipidemia, hypertension, artery stenosis since to the emergency department with worsening shortness of breath cough intermittent fever and chills. Patient on RA. Anticipate DC today. No home needs identified.      Action/Plan:  No CM needs identified at this time.   Expected Discharge Date:                  Expected Discharge Plan:  Home/Self Care  In-House Referral:     Discharge planning Services  CM Consult  Post Acute Care Choice:    Choice offered to:     DME Arranged:    DME Agency:     HH Arranged:    [place] Agency:     Status of Service:  Completed, signed off  If discussed at [employer] of Stay Meetings, dates discussed:    Additional Comments:  Carles Collet, RN 04/02/2017, 11:26 AM

## 2017-04-02 NOTE — Discharge Summary (Signed)
Physician Discharge Summary  Sheri Perkins [id number]:[id number] DOB: [birth year] DOA: 03/31/2017  PCP: Daphene Jaeger, PA-C  Admit date: 03/31/2017 Discharge date: 04/02/2017  Admitted From: Home Disposition:  Home  Recommendations for Outpatient Follow-up:  1. Follow up with PCP in 1-2 weeks 2. Please obtain BMP/CBC in one week   Discharge Condition: Stable CODE STATUS: Full Diet recommendation: Heart Healthy  Brief/Interim Summary:  Patient is a 57 year old-year-old female with past medical history of  smoking, hyperlipidemia, hypertension who presented to the emergency department with worsening shortness of breath, cough, intermittent fever and chills.  Patient was recently treated for influenza.  Chest x-ray done on admission was concerning for multi lobar pneumonia. CT chest showed right middle lobe pneumonia .  Patient was started on ceftriaxone and azithromycin.  Patient's respiratory status remarkably improved.  She has been discharged today to home with oral antibiotics.  The following problems were addressed during hospitalization:   Acute respiratory distress/Community acquired  pneumonia: Post influenza pneumonia. Chest x-ray was positive of multilobar pneumonia.  CT chest showed right middle lobe consolidation. Respiratory status much improved this morning.  Currently she is saturating fine on room air. Started on azithromycin and ceftraixone.  Changed to Levaquin on discharge .Patient was recently treated with Tamiflu for 10 days for influenza.  Leukocytosis: Trended down with antibiotics.   Sepsis: Elevated lactic acid on presentation.  Patient was also hypotensive on admission.  Lactic acid has resolved with IV fluids.  Blood pressure stable now  Acute kidney injury: Started on IV fluids.  Improvement of acute kidney injury  Hypertension: Patient was hypotensive on presentation.  Bolused with IV fluids.  Currently blood pressure stable..  Tobacco abuse: Counseled  for smoking cessation   Discharge Diagnoses:  Principal Problem:   Respiratory distress Active Problems:   Tobacco abuse   Hyperlipidemia   AKI (acute kidney injury) ([place])   Sepsis ([place])   Leukocytosis   CAP (community acquired pneumonia)   Elevated transaminase level   Influenza with pneumonia    Discharge Instructions  Discharge Instructions    Diet - low sodium heart healthy   Complete by:  As directed    Discharge instructions   Complete by:  As directed    1) Follow up with your PCP in a week. 2) Take prescribed medication as instructed. 3) Check CBC/BMP tests in a week.   Increase activity slowly   Complete by:  As directed      Allergies as of 04/02/2017      Reactions   Macrobid [nitrofurantoin Macrocrystal] Other (See Comments)   "burned lungs"   Latex Itching      Sulfa Antibiotics Hives, Rash      Medication List    STOP taking these medications   oseltamivir 75 MG capsule Commonly known as:  TAMIFLU     TAKE these medications   aspirin 81 MG tablet Take 81 mg by mouth daily.   benzonatate 100 MG capsule Commonly known as:  TESSALON Take 1 capsule (100 mg total) by mouth 2 (two) times daily as needed for cough.   levofloxacin 750 MG tablet Commonly known as:  LEVAQUIN Take 1 tablet (750 mg total) by mouth daily. Start taking on:  04/03/2017   losartan 50 MG tablet Commonly known as:  COZAAR TAKE 1/2 (ONE-HALF) TABLET BY MOUTH TWICE DAILY What changed:  See the new instructions.   TUSSIONEX PENNKINETIC ER 10-8 MG/5ML [place] Generic drug:  chlorpheniramine-HYDROcodone Take 5 mLs by mouth every 12 (twelve) hours.  Allergies  Allergen Reactions  . Macrobid [Nitrofurantoin Macrocrystal] Other (See Comments)    "burned lungs"  . Latex Itching       . Sulfa Antibiotics Hives and Rash    Consultations:  None   Procedures/Studies:  CT chest  Subjective:  Patient seen and examined the bedside this morning.  Remains  comfortable.  Respiratory status stable.  Currently saturating normally on room air.  Complains of some cough. Discharge Exam: Vitals:   04/02/17 0450 04/02/17 0853  BP: (!) 145/77 (!) 156/77  Pulse: 66 65  Resp: 18 17  Temp: 97.9 F (36.6 C) (!) 97.5 F (36.4 C)  SpO2: 97% 99%   Vitals:   04/01/17 1839 04/01/17 2155 04/02/17 0450 04/02/17 0853  BP: (!) 152/74 (!) 142/92 (!) 145/77 (!) 156/77  Pulse: 76 66 66 65  Resp: 18 16 18 17   Temp: 98.3 F (36.8 C) 98 F (36.7 C) 97.9 F (36.6 C) (!) 97.5 F (36.4 C)  TempSrc: Oral   Oral  SpO2: 96% 94% 97% 99%  Weight:  65 kg (143 lb 4.8 oz) 64.5 kg (142 lb 3.2 oz)   Height:        General: Pt is alert, awake, not in acute distress Cardiovascular: RRR, S1/S2 +, no rubs, no gallops Respiratory: CTA bilaterally, no wheezing, no rhonchi Abdominal: Soft, NT, ND, bowel sounds  Extremities: no edema, no cyanosis    The results of significant diagnostics from this hospitalization (including imaging, microbiology, ancillary and laboratory) are listed below for reference.     Microbiology: Recent Results (from the past 240 hour(s))  Blood Culture (routine x 2)     Status: None (Preliminary result)   Collection Time: 03/31/17  2:39 PM  Result Value Ref Range Status   Specimen Description BLOOD RIGHT HAND  Final   Special Requests   Final    BOTTLES DRAWN AEROBIC AND ANAEROBIC Blood Culture adequate volume   Culture NO GROWTH < 24 HOURS  Final   Report Status PENDING  Incomplete  Blood Culture (routine x 2)     Status: None (Preliminary result)   Collection Time: 03/31/17  3:02 PM  Result Value Ref Range Status   Specimen Description BLOOD RIGHT ANTECUBITAL  Final   Special Requests   Final    BOTTLES DRAWN AEROBIC AND ANAEROBIC Blood Culture adequate volume   Culture NO GROWTH < 24 HOURS  Final   Report Status PENDING  Incomplete  Respiratory Panel by PCR     Status: None   Collection Time: 03/31/17  8:44 PM  Result Value Ref  Range Status   Adenovirus NOT DETECTED NOT DETECTED Final   Coronavirus 229E NOT DETECTED NOT DETECTED Final   Coronavirus HKU1 NOT DETECTED NOT DETECTED Final   Coronavirus NL63 NOT DETECTED NOT DETECTED Final   Coronavirus OC43 NOT DETECTED NOT DETECTED Final   Metapneumovirus NOT DETECTED NOT DETECTED Final   Rhinovirus / Enterovirus NOT DETECTED NOT DETECTED Final   Influenza A NOT DETECTED NOT DETECTED Final   Influenza B NOT DETECTED NOT DETECTED Final   Parainfluenza Virus 1 NOT DETECTED NOT DETECTED Final   Parainfluenza Virus 2 NOT DETECTED NOT DETECTED Final   Parainfluenza Virus 3 NOT DETECTED NOT DETECTED Final   Parainfluenza Virus 4 NOT DETECTED NOT DETECTED Final   Respiratory Syncytial Virus NOT DETECTED NOT DETECTED Final   Bordetella pertussis NOT DETECTED NOT DETECTED Final   Chlamydophila pneumoniae NOT DETECTED NOT DETECTED Final   Mycoplasma pneumoniae NOT  DETECTED NOT DETECTED Final     Labs: BNP (last 3 results) No results for input(s): BNP in the last 8760 hours. Basic Metabolic Panel: Recent Labs  Lab 03/31/17 1208 04/01/17 0841  NA 138 141  K 3.9 3.5  CL 103 110  CO2 24 23  GLUCOSE 114* 102*  BUN 24* 13  CREATININE 1.22* 0.75  CALCIUM 8.5* 8.2*   Liver Function Tests: Recent Labs  Lab 03/31/17 1208  AST 18  ALT 13*  ALKPHOS 144*  BILITOT 1.5*  PROT 6.9  ALBUMIN 2.8*   No results for input(s): LIPASE, AMYLASE in the last 168 hours. No results for input(s): AMMONIA in the last 168 hours. CBC: Recent Labs  Lab 03/31/17 1208 04/01/17 0841 04/02/17 0431  WBC 34.0* 15.0* 9.4  NEUTROABS 30.6*  --  6.1  HGB 12.8 10.2* 11.1*  HCT 38.7 31.7* 33.8*  MCV 92.4 92.2 90.9  PLT 238 211 239   Cardiac Enzymes: No results for input(s): CKTOTAL, CKMB, CKMBINDEX, TROPONINI in the last 168 hours. BNP: Invalid input(s): POCBNP CBG: No results for input(s): GLUCAP in the last 168 hours. D-Dimer No results for input(s): DDIMER in the last 72  hours. Hgb A1c No results for input(s): HGBA1C in the last 72 hours. Lipid Profile No results for input(s): CHOL, HDL, LDLCALC, TRIG, CHOLHDL, LDLDIRECT in the last 72 hours. Thyroid function studies No results for input(s): TSH, T4TOTAL, T3FREE, THYROIDAB in the last 72 hours.  Invalid input(s): FREET3 Anemia work up No results for input(s): VITAMINB12, FOLATE, FERRITIN, TIBC, IRON, RETICCTPCT in the last 72 hours. Urinalysis    Component Value Date/Time   COLORURINE AMBER (A) 03/31/2017 1524   APPEARANCEUR HAZY (A) 03/31/2017 1524   LABSPEC 1.019 03/31/2017 1524   PHURINE 5.0 03/31/2017 1524   GLUCOSEU NEGATIVE 03/31/2017 1524   HGBUR LARGE (A) 03/31/2017 1524   BILIRUBINUR NEGATIVE 03/31/2017 1524   KETONESUR NEGATIVE 03/31/2017 1524   PROTEINUR 100 (A) 03/31/2017 1524   NITRITE POSITIVE (A) 03/31/2017 1524   LEUKOCYTESUR TRACE (A) 03/31/2017 1524   Sepsis Labs Invalid input(s): PROCALCITONIN,  WBC,  LACTICIDVEN Microbiology Recent Results (from the past 240 hour(s))  Blood Culture (routine x 2)     Status: None (Preliminary result)   Collection Time: 03/31/17  2:39 PM  Result Value Ref Range Status   Specimen Description BLOOD RIGHT HAND  Final   Special Requests   Final    BOTTLES DRAWN AEROBIC AND ANAEROBIC Blood Culture adequate volume   Culture NO GROWTH < 24 HOURS  Final   Report Status PENDING  Incomplete  Blood Culture (routine x 2)     Status: None (Preliminary result)   Collection Time: 03/31/17  3:02 PM  Result Value Ref Range Status   Specimen Description BLOOD RIGHT ANTECUBITAL  Final   Special Requests   Final    BOTTLES DRAWN AEROBIC AND ANAEROBIC Blood Culture adequate volume   Culture NO GROWTH < 24 HOURS  Final   Report Status PENDING  Incomplete  Respiratory Panel by PCR     Status: None   Collection Time: 03/31/17  8:44 PM  Result Value Ref Range Status   Adenovirus NOT DETECTED NOT DETECTED Final   Coronavirus 229E NOT DETECTED NOT DETECTED  Final   Coronavirus HKU1 NOT DETECTED NOT DETECTED Final   Coronavirus NL63 NOT DETECTED NOT DETECTED Final   Coronavirus OC43 NOT DETECTED NOT DETECTED Final   Metapneumovirus NOT DETECTED NOT DETECTED Final   Rhinovirus / Enterovirus NOT  DETECTED NOT DETECTED Final   Influenza A NOT DETECTED NOT DETECTED Final   Influenza B NOT DETECTED NOT DETECTED Final   Parainfluenza Virus 1 NOT DETECTED NOT DETECTED Final   Parainfluenza Virus 2 NOT DETECTED NOT DETECTED Final   Parainfluenza Virus 3 NOT DETECTED NOT DETECTED Final   Parainfluenza Virus 4 NOT DETECTED NOT DETECTED Final   Respiratory Syncytial Virus NOT DETECTED NOT DETECTED Final   Bordetella pertussis NOT DETECTED NOT DETECTED Final   Chlamydophila pneumoniae NOT DETECTED NOT DETECTED Final   Mycoplasma pneumoniae NOT DETECTED NOT DETECTED Final     Time coordinating discharge: Over 30 minutes  SIGNED:   Marene Lenz, MD  [facility] 04/02/2017, 11:51 AM Pager [phone]  If 7PM-7AM, please contact night-coverage [email or web address] Password [email or web address]

## 2017-04-02 NOTE — Progress Notes (Signed)
Pt discharge instructions given, pt verbalized understanding.  VSS.  Denies pain.  Note for work given, pt left floor via wheelchair accompanied by staff and family.

## 2017-04-05 LAB — CULTURE, BLOOD (ROUTINE X 2)
Culture: NO GROWTH
Culture: NO GROWTH
SPECIAL REQUESTS: ADEQUATE
Special Requests: ADEQUATE

## 2017-04-23 DIAGNOSIS — E559 Vitamin D deficiency, unspecified: Secondary | ICD-10-CM | POA: Diagnosis not present

## 2017-04-23 DIAGNOSIS — R5383 Other fatigue: Secondary | ICD-10-CM | POA: Diagnosis not present

## 2017-05-06 DIAGNOSIS — Z87891 Personal history of nicotine dependence: Secondary | ICD-10-CM | POA: Diagnosis not present

## 2017-05-06 DIAGNOSIS — R5383 Other fatigue: Secondary | ICD-10-CM | POA: Diagnosis not present

## 2017-05-06 DIAGNOSIS — J4530 Mild persistent asthma, uncomplicated: Secondary | ICD-10-CM | POA: Diagnosis not present

## 2017-05-06 DIAGNOSIS — E559 Vitamin D deficiency, unspecified: Secondary | ICD-10-CM | POA: Diagnosis not present

## 2017-05-11 DIAGNOSIS — R55 Syncope and collapse: Secondary | ICD-10-CM | POA: Diagnosis not present

## 2017-05-19 ENCOUNTER — Other Ambulatory Visit: Payer: Self-pay | Admitting: Family Medicine

## 2017-05-19 DIAGNOSIS — R55 Syncope and collapse: Secondary | ICD-10-CM

## 2017-05-28 ENCOUNTER — Ambulatory Visit (HOSPITAL_COMMUNITY): Payer: BLUE CROSS/BLUE SHIELD | Attending: Cardiology

## 2017-05-28 ENCOUNTER — Ambulatory Visit (INDEPENDENT_AMBULATORY_CARE_PROVIDER_SITE_OTHER): Payer: BLUE CROSS/BLUE SHIELD

## 2017-05-28 ENCOUNTER — Other Ambulatory Visit: Payer: Self-pay

## 2017-05-28 DIAGNOSIS — I10 Essential (primary) hypertension: Secondary | ICD-10-CM | POA: Diagnosis not present

## 2017-05-28 DIAGNOSIS — E785 Hyperlipidemia, unspecified: Secondary | ICD-10-CM | POA: Insufficient documentation

## 2017-05-28 DIAGNOSIS — F17200 Nicotine dependence, unspecified, uncomplicated: Secondary | ICD-10-CM | POA: Insufficient documentation

## 2017-05-28 DIAGNOSIS — R55 Syncope and collapse: Secondary | ICD-10-CM

## 2017-05-31 NOTE — Progress Notes (Signed)
Cardiology Office Note   Date:  06/01/2017   ID:  Sheri Perkins, DOB 13-Jul-1960, MRN [id number]  PCP:  System, Provider Not In  Cardiologist:  Dr,Croitoru  Chief Complaint  Patient presents with  . Dizziness     History of Present Illness: Sheri Perkins is a 57 y.o. female who presents for ongoing assessment and management of peripheral arterial disease with known history of 60% to 90% right renal artery stenosis, presumed secondary to fibromuscular dysplasia, with other history to include mild hyperlipidemia, and ongoing tobacco abuse.   It was noted when seen last in the office if she had several episodes of near syncope preceded by flushing and followed by nausea and relieved by vomiting. 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  The patient was continued on medical management by Dr. Sallyanne Kuster. She was again counseled on smoking cessation. He was prescribed hydration, compression stocking, and low-salt diet.  She is here today with continued complaints of dizziness leading to nausea and near-syncope. The patient has been wearing support hose, and also quit smoking 2 months ago.  Past Medical History:  Diagnosis Date  . Acute bronchiolitis   . Allergy   . Arthritis    fingers , back, neck   . Hyperlipidemia   . Renal artery stenosis (HCC) 04/03/2011   right 1-59% proximal reduction,left main 1-59% reduction,  . Renovascular hypertension   . Tobacco abuse     Past Surgical History:  Procedure Laterality Date  . arm surgery Bilateral   . CARPAL TUNNEL RELEASE Bilateral   . COLONOSCOPY    . HAND SURGERY    . KNEE SURGERY    . POLYPECTOMY    . SHOULDER SURGERY Left   . US ECHOCARDIOGRAPHY  06/16/2010   normal     Current Outpatient Medications  Medication Sig Dispense Refill  . aspirin 81 MG tablet Take 81 mg by mouth daily.    Marland Kitchen losartan (COZAAR) 50 MG tablet TAKE 1/2 (ONE-HALF) TABLET  BY MOUTH TWICE DAILY (Patient taking differently: 50mg  by mouth once daily) 30 tablet 11  . Vitamin D, Ergocalciferol, (DRISDOL) 50000 units CAPS capsule Take 1 capsule by mouth once a week.     Current Facility-Administered Medications  Medication Dose Route Frequency Provider Last Rate Last Dose  . 0.9 %  sodium chloride infusion  500 mL Intravenous Continuous Nandigam, Venia Minks, MD        Allergies:   Macrobid [nitrofurantoin macrocrystal]; Latex; and Sulfa antibiotics    Social History:  The patient  reports that she quit smoking about 8 weeks ago. Her smoking use included cigarettes. She smoked 0.12 packs per day. she has never used smokeless tobacco. She reports that she does not drink alcohol or use drugs.   Family History:  The patient's family history includes Melanoma in her sister.    ROS: All other systems are reviewed and negative. Unless otherwise mentioned in H&P    PHYSICAL EXAM: VS:  BP 134/74   Pulse 84   Ht 5' (1.524 m)   Wt 144 lb 12.8 oz (65.7 kg)   BMI 28.28 kg/m  , BMI Body mass index is 28.28 kg/m. GEN: Well nourished, well developed, in no acute distress  HEENT: normal  Neck: no JVD, carotid bruits, or masses Cardiac: RRR; no murmurs, rubs, or gallops,no edema  Respiratory:  clear to auscultation bilaterally, normal work of breathing GI: soft, nontender, nondistended, +  BS MS: no deformity or atrophy  Skin: warm and dry, no rash Neuro:  Strength and sensation are intact Psych: euthymic mood, full affect   EKG:  Normal sinus rhythm heart rate of 71 bpm.  Recent Labs: 03/31/2017: ALT 13 04/01/2017: BUN 13; Creatinine, Ser 0.75; Potassium 3.5; Sodium 141 04/02/2017: Hemoglobin 11.1; Platelets 239    Lipid Panel    Component Value Date/Time   CHOL 175 05/10/2015 0910   TRIG 73 05/10/2015 0910   HDL 43 (L) 05/10/2015 0910   CHOLHDL 4.1 05/10/2015 0910   VLDL 15 05/10/2015 0910   LDLCALC 117 05/10/2015 0910      Wt Readings from Last 3  Encounters:  06/01/17 144 lb 12.8 oz (65.7 kg)  04/02/17 142 lb 3.2 oz (64.5 kg)  07/27/16 156 lb (70.8 kg)    Other studies Reviewed: Left ventricle: The cavity size was normal. Wall thickness was   normal. Systolic function was normal. The estimated ejection   fraction was in the range of 55% to 60%. Wall motion was normal;   there were no regional wall motion abnormalities. Left   ventricular diastolic function parameters were normal.  Impressions:  - Normal study.  ASSESSMENT AND PLAN:  1. Dizziness: All other symptoms have improved somewhat with support hose, he continues to experience this. She was diagnosed with neurally- mediated syncope. She has not had any more near syncopal episodes.  Continues to drink a good bit of caffeine during the day, several Dr. Samson Frederic. Advised to increase water intake and decrease caffeine intake. I've also going to refer her to ENT to evaluate middle ear etiology of her recurrent dizziness. She is a former smoker concerned mother her sinuses and ear canal may be precipitating these events.  2. Tobacco abuse in remission: The patient has stopped smoking since. Grade 2019 after a bout of pneumonia. She is congratulated on this lifestyle change.   Current medicines are reviewed at length with the patient today.    Labs/ tests ordered today include: None  Phill Myron. [place] Pugh, ANP, AACC   06/01/2017 4:05 PM    [facility] [address], [place], [place] [address] Phone: [phone]; Fax: [phone]

## 2017-06-01 ENCOUNTER — Encounter: Payer: Self-pay | Admitting: Adult Health

## 2017-06-01 ENCOUNTER — Ambulatory Visit: Payer: BLUE CROSS/BLUE SHIELD | Admitting: Adult Health

## 2017-06-01 VITALS — BP 134/74 | HR 84 | Ht 60.0 in | Wt 144.8 lb

## 2017-06-01 DIAGNOSIS — Z720 Tobacco use: Secondary | ICD-10-CM | POA: Diagnosis not present

## 2017-06-01 DIAGNOSIS — R42 Dizziness and giddiness: Secondary | ICD-10-CM

## 2017-06-01 DIAGNOSIS — R55 Syncope and collapse: Secondary | ICD-10-CM

## 2017-06-01 NOTE — Patient Instructions (Signed)
Medication Instructions:  NO CHANGES- Your physician recommends that you continue on your current medications as directed. Please refer to the Current Medication list given to you today.  If you need a refill on your cardiac medications before your next appointment, please call your pharmacy.  Special Instructions: REFERRAL TO ENT  TRY TO INCREASE FLUID INTAKE(WATER) DECREASE SODA INTAKE  Follow-Up: Your physician wants you to follow-up in: [place] should receive a reminder letter in the mail two months in advance. If you do not receive a letter, please call our office 09-2017 to schedule the 11-2017 follow-up appointment.   Thank you for choosing [facility] at [facility]!!

## 2017-06-21 ENCOUNTER — Encounter: Payer: Self-pay | Admitting: Nurse Practitioner

## 2017-06-21 ENCOUNTER — Ambulatory Visit (INDEPENDENT_AMBULATORY_CARE_PROVIDER_SITE_OTHER): Payer: Worker's Compensation | Admitting: Nurse Practitioner

## 2017-06-21 VITALS — BP 131/81 | HR 69 | Temp 96.9°F | Ht 60.0 in | Wt 146.0 lb

## 2017-06-21 DIAGNOSIS — S0502XA Injury of conjunctiva and corneal abrasion without foreign body, left eye, initial encounter: Secondary | ICD-10-CM

## 2017-06-21 MED ORDER — ERYTHROMYCIN 5 MG/GM OP OINT: 1.0000 "application " | TOPICAL_OINTMENT | Freq: Every day | OPHTHALMIC | 0 refills | Status: DC

## 2017-06-21 NOTE — Progress Notes (Signed)
   Subjective:    Patient ID: Sheri Perkins, female    DOB: Nov 21, 1960, 57 y.o.   MRN: [id number]  HPI Sheri Perkins comes in today for workers comp injury.  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 She flushed it out immediately. She  thought she got it all out but it has been hurting since it occurred.      Review of Systems  Eyes: Positive for pain.  Respiratory: Negative.   Cardiovascular: Negative.   Neurological: Negative.   Psychiatric/Behavioral: Negative.   All other systems reviewed and are negative.      Objective:   Physical Exam  Constitutional: She is oriented to person, place, and time. She appears well-developed and well-nourished. No distress.  Eyes: Lids are everted and swept, no foreign bodies found. No foreign body present in the right eye. No foreign body present in the left eye.    Cardiovascular: Normal rate and regular rhythm.  Pulmonary/Chest: Effort normal and breath sounds normal.  Neurological: She is alert and oriented to person, place, and time.  Skin: Skin is warm.  Psychiatric: She has a normal mood and affect. Her behavior is normal. Judgment and thought content normal.   BP 131/81   Pulse 69   Temp (!) 96.9 F (36.1 C) (Oral)   Ht 5' (1.524 m)   Wt 146 lb (66.2 kg)   BMI 28.51 kg/m  Procedure: fluoreschien stains  Small corneal abrasion at 6 o clock position   No foreign body visible  Eye flushed       Assessment & Plan:   1. Abrasion of left cornea, initial encounter    Avoid rubbing Cool compresses If not improving will need to see eye doctor tomorrow. Meds ordered this encounter  Medications  . erythromycin [facility]) ophthalmic ointment    Sig: Place 1 application into the left eye at bedtime.    Dispense:  3.5 g    Refill:  0    Order Specific Question:   Supervising Provider    Answer:    Eustaquio Maize [[id number]]   Mary-Margaret Hassell Done, FNP

## 2017-06-21 NOTE — Patient Instructions (Signed)
Corneal Abrasion A corneal abrasion is a scratch or injury to the clear covering over the front of your eye (cornea). This can be painful. It is important to get treatment for a corneal abrasion. If this problem is not treated, it can affect your eyesight (vision). Follow these instructions at home: Medicines  Use eye drops or ointments as told by your doctor.  If you were prescribed antibiotic drops or ointment, use them as told by your doctor. Do not stop using the antibiotic even if you start to feel better.  Take over-the-counter and prescription medicines only as told by your doctor.  Do not drive or use heavy machinery while taking prescription pain medicine. General instructions  If you have an eye patch, wear it as told by your doctor. ? Do not drive or use machinery while wearing an eye patch. ? Follow instructions from your doctor about when to take off the patch.  Ask your doctor if you can use a cold, wet cloth (compress) on your eye to help with pain.  Do not rub or touch your eye. Do not wash out your eye.  Do not wear contact lenses until your doctor says that this is okay.  Avoid bright light.  Avoid straining your eyes.  Keep all follow-up visits as told by your doctor. Doing this can help to prevent infection and loss of eyesight. Contact a doctor if:  You continue to have eye pain and other symptoms for more than 2 days.  You get new symptoms, such as: ? Redness. ? Watery eyes (tearing). ? Discharge.  You have discharge that makes your eyelids stick together in the morning.  Symptoms come back after your eye heals. Get help right away if:  You have very bad eye pain that does not get better with medicine.  You lose eyesight. Summary  A corneal abrasion is a scratch or injury to the clear covering over the front of your eye (cornea).  It is important to get treatment for a corneal abrasion. If this problem is not treated, it can affect your eyesight  (vision).  Use eye drops or ointments as told by your doctor.  If you have an eye patch, do not drive or use machinery while wearing it. This information is not intended to replace advice given to you by your health care provider. Make sure you discuss any questions you have with your health care provider. Document Released: 08/26/2007 Document Revised: 02/22/2016 Document Reviewed: 02/22/2016 Elsevier Interactive Patient Education  2017 [employer].

## 2017-07-21 DIAGNOSIS — H903 Sensorineural hearing loss, bilateral: Secondary | ICD-10-CM | POA: Diagnosis not present

## 2017-07-21 DIAGNOSIS — H8143 Vertigo of central origin, bilateral: Secondary | ICD-10-CM | POA: Diagnosis not present

## 2017-07-21 DIAGNOSIS — R42 Dizziness and giddiness: Secondary | ICD-10-CM | POA: Diagnosis not present

## 2017-08-12 ENCOUNTER — Other Ambulatory Visit: Payer: Self-pay | Admitting: Cardiovascular Disease

## 2017-08-30 DIAGNOSIS — R5383 Other fatigue: Secondary | ICD-10-CM | POA: Diagnosis not present

## 2017-12-22 DIAGNOSIS — Z6828 Body mass index (BMI) 28.0-28.9, adult: Secondary | ICD-10-CM | POA: Diagnosis not present

## 2017-12-22 DIAGNOSIS — Z01419 Encounter for gynecological examination (general) (routine) without abnormal findings: Secondary | ICD-10-CM | POA: Diagnosis not present

## 2018-01-24 ENCOUNTER — Telehealth: Payer: Self-pay | Admitting: Cardiovascular Disease

## 2018-01-24 MED ORDER — ASPIRIN EC 81 MG PO TBEC: 81.0000 mg | DELAYED_RELEASE_TABLET | Freq: Every day | ORAL | 3 refills | Status: DC

## 2018-01-24 NOTE — Telephone Encounter (Signed)
Attempt to call patient x2 -"currently unavailable".  Will reattempt

## 2018-01-24 NOTE — Telephone Encounter (Signed)
Attempt to call patient-continues to say "number you dialed is currently unavailable".

## 2018-01-24 NOTE — Telephone Encounter (Signed)
New Message         Patient is calling today due to numbness on left side of mouth/arm/leg and foot all on the left side, this just happened.

## 2018-01-24 NOTE — Telephone Encounter (Signed)
Could be a TIA. Please ask her to take ASA 325 mg today and then 81 mg daily starting tomorrow. MCr

## 2018-01-24 NOTE — Telephone Encounter (Signed)
Spoke to patient-aware and verbalized understanding. 

## 2018-01-24 NOTE — Telephone Encounter (Signed)
Received call from patient complaining of episode of left sided face, arm, hand, leg and foot numbness that lasted approximately 15 mins.   This has resolved, currently denies symptoms.   She states this has never happened before.  She is requesting an appointment with Dr. Sallyanne Kuster today or tomorrow.  She states her BP and HR were "fine".  No trouble walking or talking even with numbness.  Advised patient Dr. Sallyanne Kuster not here today and her symptoms are concerning although they do not sound cardiac related.  Advised to go to ER to be evaluated for acute issues.   Patient refused and would like appt with Dr. Sallyanne Kuster.   Appt made for tomorrow at 140 pm with Dr. Sallyanne Kuster.   Again encouraged patient to proceed to ER to be evaluated but patient refused. She states she will go if it occurs again.

## 2018-01-25 ENCOUNTER — Encounter: Payer: Self-pay | Admitting: Cardiovascular Disease

## 2018-01-25 ENCOUNTER — Ambulatory Visit: Payer: BLUE CROSS/BLUE SHIELD | Admitting: Cardiovascular Disease

## 2018-01-25 VITALS — BP 132/64 | HR 71 | Ht 60.0 in | Wt 143.8 lb

## 2018-01-25 DIAGNOSIS — I150 Renovascular hypertension: Secondary | ICD-10-CM

## 2018-01-25 DIAGNOSIS — R55 Syncope and collapse: Secondary | ICD-10-CM

## 2018-01-25 DIAGNOSIS — Z87891 Personal history of nicotine dependence: Secondary | ICD-10-CM | POA: Diagnosis not present

## 2018-01-25 DIAGNOSIS — E785 Hyperlipidemia, unspecified: Secondary | ICD-10-CM

## 2018-01-25 DIAGNOSIS — G459 Transient cerebral ischemic attack, unspecified: Secondary | ICD-10-CM | POA: Diagnosis not present

## 2018-01-25 DIAGNOSIS — Z79899 Other long term (current) drug therapy: Secondary | ICD-10-CM

## 2018-01-25 MED ORDER — ASPIRIN 81 MG PO TABS
81.0000 mg | ORAL_TABLET | Freq: Every day | ORAL | Status: DC
Start: 2018-01-25 — End: 2018-08-25

## 2018-01-25 NOTE — Progress Notes (Signed)
Patient ID: [name], female   DOB: 10-31-1960, 57 y.o.   MRN: [id number]    Cardiology Office Note    Date:  01/26/2018   ID:  Sheri Perkins, DOB 1960/05/12, MRN [id number]  PCP:  Sheri Skeens., MD  Cardiologist:   Sheri Klein, MD   Chief Complaint  Patient presents with  . Transient Ischemic Attack    History of Present Illness:  Sheri Perkins is a 57 y.o. female with a history of a 60-99% right renal artery stenosis, presumed secondary to fibromuscular dysplasia, neurally mediated syncope, previous smoking and mild hyperlipidemia.  She presents after a complaints of wheezing.  The episodes began with numbness on the left side of her face consult for first numbness over left hand and left upper extremity and then spread to the left lower extremity as well.  She had some clumsiness with her left hand that she could not feel what she was preparing, but no real weakness.  She did not feel leg give out.  She did not look in the mirror to see if there was any asymmetry.  She did not have any difficulty speaking.  The entire episode lasted for approximately 10 minutes and has not recurred.  The patient specifically denies any chest pain at rest exertion, dyspnea at rest or with exertion, orthopnea, paroxysmal nocturnal dyspnea, syncope, palpitations, focal neurological deficits, intermittent claudication, lower extremity edema, unexplained weight gain, cough, hemoptysis or wheezing.     Past Medical History:  Diagnosis Date  . Acute bronchiolitis   . Allergy   . Arthritis    fingers , back, neck   . Hyperlipidemia   . Renal artery stenosis (HCC) 04/03/2011   right 1-59% proximal reduction,left main 1-59% reduction,  . Renovascular hypertension   . Tobacco abuse     Past Surgical History:  Procedure Laterality Date  . arm surgery Bilateral   . CARPAL TUNNEL RELEASE Bilateral   . COLONOSCOPY    . HAND SURGERY    . KNEE SURGERY    . POLYPECTOMY    . SHOULDER  SURGERY Left   . US ECHOCARDIOGRAPHY  06/16/2010   normal    Outpatient Medications Prior to Visit  Medication Sig Dispense Refill  . losartan (COZAAR) 50 MG tablet TAKE 1/2 (ONE-HALF) TABLET BY MOUTH TWICE DAILY 30 tablet 11  . aspirin EC 81 MG tablet Take 1 tablet (81 mg total) by mouth daily. (Patient not taking: Reported on 01/25/2018) 90 tablet 3  . erythromycin (ROMYCIN) ophthalmic ointment Place 1 application into the left eye at bedtime. (Patient not taking: Reported on 01/25/2018) 3.5 g 0  . Vitamin D, Ergocalciferol, (DRISDOL) 50000 units CAPS capsule Take 1 capsule by mouth once a week.    Marland Kitchen 0.9 %  sodium chloride infusion      No facility-administered medications prior to visit.      Allergies:   Macrobid [nitrofurantoin macrocrystal]; Latex; and Sulfa antibiotics   Social History   Socioeconomic History  . Marital status: Married    Spouse name: Not on file  . Number of children: Not on file  . Years of education: Not on file  . Highest education level: Not on file  Occupational History  . Not on file  Social Needs  . Financial resource strain: Not on file  . Food insecurity:    Worry: Not on file    Inability: Not on file  . Transportation needs:    Medical: Not on file  Non-medical: Not on file  Tobacco Use  . Smoking status: Former Smoker    Packs/day: 0.12    Types: Cigarettes    Last attempt to quit: 04/03/2017    Years since quitting: 0.8  . Smokeless tobacco: Never Used  Substance and Sexual Activity  . Alcohol use: No    Alcohol/week: 0.0 standard drinks  . Drug use: No  . Sexual activity: Not on file  Lifestyle  . Physical activity:    Days per week: Not on file    Minutes per session: Not on file  . Stress: Not on file  Relationships  . Social connections:    Talks on phone: Not on file    Gets together: Not on file    Attends religious service: Not on file    Active member of club or organization: Not on file    Attends meetings of  clubs or organizations: Not on file    Relationship status: Not on file  Other Topics Concern  . Not on file  Social History Narrative  . Not on file     Family History:  The patient's mother has coronary artery disease status post stent, obstructive sleep apnea, hyperlipidemia  ROS:   Please see the history of present illness.    ROS All other systems are reviewed and are negative   PHYSICAL EXAM:   VS:  BP 132/64   Pulse 71   Ht 5' (1.524 m)   Wt 143 lb 12.8 oz (65.2 kg)   BMI 28.08 kg/m     General: Alert, oriented x3, no distress, overweight Head: no evidence of trauma, PERRL, EOMI, no exophtalmos or lid lag, no myxedema, no xanthelasma; normal ears, nose and oropharynx Neck: normal jugular venous pulsations and no hepatojugular reflux; faint bilateral carotid bruits little louder on the left, possibly radiating from the chest, no carotid delay  Chest: clear to auscultation, no signs of consolidation by percussion or palpation, normal fremitus, symmetrical and full respiratory excursions Cardiovascular: normal position and quality of the apical impulse, regular rhythm, normal first and second heart sounds, 1-2/6 aortic ejection murmur, early peaking, no diastolic murmurs, rubs or gallops Abdomen: no tenderness or distention, no masses by palpation, no abnormal pulsatility or arterial bruits, normal bowel sounds, no hepatosplenomegaly Extremities: no clubbing, cyanosis or edema; 2+ radial, ulnar and brachial pulses bilaterally; 2+ right femoral, posterior tibial and dorsalis pedis pulses; 2+ left femoral, posterior tibial and dorsalis pedis pulses; no subclavian or femoral bruits Neurological: grossly nonfocal; 5/5 strength in all 4 extremities, normal deep tendon reflexes; face is symmetrical, speech is normal and fluent, cranial nerves II-XII appear to be intact.  Gross bedside analysis of the visual fields appears to be normal. Psych: Normal mood and affect   Wt Readings from  Last 3 Encounters:  01/25/18 143 lb 12.8 oz (65.2 kg)  06/21/17 146 lb (66.2 kg)  06/01/17 144 lb 12.8 oz (65.7 kg)      Studies/Labs Reviewed:   EKG:  EKG is ordered today.  The ekg ordered today shows normal sinus rhythm, QTC 436 ms  Recent Labs: 06/24/2016 glucose 94, cholesterol 152, triglycerides 88, HDL 43, LDL 91, creatinine 0.68, TSH 1.62    ASSESSMENT:    1. TIA (transient ischemic attack)   2. Renovascular hypertension   3. Dyslipidemia   4. History of cigarette smoking   5. Neurocardiogenic syncope   6. Medication management      PLAN:  In order of problems listed above:  1. TIA: The description of her symptoms sounds highly compatible with a right hemispheric TIA with a predominantly parietal distribution.  There are no residual neurological deficits today.  She has never had a stroke or TIA in the past.  Plan bilateral carotid duplex ultrasound and a 30-day event monitor for atrial fibrillation.  She should start taking aspirin 81 mg daily.   2. Renal artery stenosis/renovascular hypertension: Remains optimally controlled on single agent, normal renal function to date. We'll continue medical management.  3. HLP: For now target LDL cholesterol less than 100, but if we identify any carotid atherosclerosis should treat for LDL cholesterol less than 70 which will probably imply the need for statin therapy. 4. Smoking cessation congratulated on not smoking, again reviewed the importance of avoiding tobacco. 5. Neurally mediated syncope: No new events since her last appointment.  She did not have any dizziness or prodromal symptoms of syncope associated with her recent neurological deficit.    Medication Adjustments/Labs and Tests Ordered: Current medicines are reviewed at length with the patient today.  Concerns regarding medicines are outlined above.  Medication changes, Labs and Tests ordered today are listed in the Patient Instructions below. Patient Instructions    Medication Instructions:  Dr Sallyanne Kuster has recommended making the following medication changes: 1. START Aspirin 81 mg - take 1 tablet daily  If you need a refill on your cardiac medications before your next appointment, please call your pharmacy.   Lab work: Your physician recommends that you return for lab work at your convenience - FASTING.  If you have labs (blood work) drawn today and your tests are completely normal, you will receive your results only by: Marland Kitchen MyChart Message (if you have MyChart) OR . A paper copy in the mail If you have any lab test that is abnormal or we need to change your treatment, we will call you to review the results.  Testing/Procedures: 1. Carotid Duplex - Your physician has requested that you have a carotid duplex. This test is an ultrasound of the carotid arteries in your neck. It looks at blood flow through these arteries that supply the brain with blood. Allow one hour for this exam. There are no restrictions or special instructions. >> This will be performed and our [facility] office [address], [place] [address] [place] [address] [phone]  2. 30-day Cardiac Event Monitor - Your physician has recommended that you wear an event monitor. Event monitors are medical devices that record the heart's electrical activity. Doctors most often [place] these monitors to diagnose arrhythmias. Arrhythmias are problems with the speed or rhythm of the heartbeat. The monitor is a small, portable device. You can wear one while you do your normal daily activities. This is usually used to diagnose what is causing palpitations/syncope (passing out). >> This will be placed at our [facility] location [place], [place] [address] [phone]  Follow-Up: At [facility], you and your health needs are our priority.  As part of our continuing mission to provide you with exceptional heart care, we have created designated Provider Care Teams.  These Care Teams  include your primary Cardiologist (physician) and Advanced Practice Providers (APPs -  Physician Assistants and Nurse Practitioners) who all work together to provide you with the care you need, when you need it. You will need a follow up appointment after your tests after been completed. Please call our office 2 months in advance to schedule this appointment.  You may see Dani Gobble  Luvena Wentling, MD or one of the following Advanced Practice Providers on your designated Care Team: [place], [place] . Fabian Sharp, PA-C      Signed, Sheri Klein, MD  01/26/2018 8:52 AM    [facility] [place], [place], [place]  [address] Phone: [phone]; Fax: [phone]

## 2018-01-25 NOTE — Patient Instructions (Signed)
Medication Instructions:  Dr Sallyanne Kuster has recommended making the following medication changes: 1. START Aspirin 81 mg - take 1 tablet daily  If you need a refill on your cardiac medications before your next appointment, please call your pharmacy.   Lab work: Your physician recommends that you return for lab work at your convenience - FASTING.  If you have labs (blood work) drawn today and your tests are completely normal, you will receive your results only by: Marland Kitchen MyChart Message (if you have MyChart) OR . A paper copy in the mail If you have any lab test that is abnormal or we need to change your treatment, we will call you to review the results.  Testing/Procedures: 1. Carotid Duplex - Your physician has requested that you have a carotid duplex. This test is an ultrasound of the carotid arteries in your neck. It looks at blood flow through these arteries that supply the brain with blood. Allow one hour for this exam. There are no restrictions or special instructions. >> This will be performed and our [facility] office [address], [place] [address] [place] [address] [phone]  2. 30-day Cardiac Event Monitor - Your physician has recommended that you wear an event monitor. Event monitors are medical devices that record the heart's electrical activity. Doctors most often [place] these monitors to diagnose arrhythmias. Arrhythmias are problems with the speed or rhythm of the heartbeat. The monitor is a small, portable device. You can wear one while you do your normal daily activities. This is usually used to diagnose what is causing palpitations/syncope (passing out). >> This will be placed at our [facility] location [place] [place], [place] [address] [phone]  Follow-Up: At [facility], you and your health needs are our priority.  As part of our continuing mission to provide you with exceptional heart care, we have created designated Provider Care Teams.  These Care Teams  include your primary Cardiologist (physician) and Advanced Practice Providers (APPs -  Physician Assistants and Nurse Practitioners) who all work together to provide you with the care you need, when you need it. You will need a follow up appointment after your tests after been completed. Please call our office 2 months in advance to schedule this appointment.  You may see Sanda Klein, MD or one of the following Advanced Practice Providers on your designated Care Team: [place], [place] . Fabian Sharp, PA-C

## 2018-01-26 DIAGNOSIS — E785 Hyperlipidemia, unspecified: Secondary | ICD-10-CM | POA: Insufficient documentation

## 2018-01-26 DIAGNOSIS — G459 Transient cerebral ischemic attack, unspecified: Secondary | ICD-10-CM | POA: Insufficient documentation

## 2018-01-26 DIAGNOSIS — Z8673 Personal history of transient ischemic attack (TIA), and cerebral infarction without residual deficits: Secondary | ICD-10-CM | POA: Insufficient documentation

## 2018-01-28 ENCOUNTER — Ambulatory Visit: Payer: Self-pay | Admitting: Cardiovascular Disease

## 2018-01-28 DIAGNOSIS — Z79899 Other long term (current) drug therapy: Secondary | ICD-10-CM | POA: Diagnosis not present

## 2018-01-28 DIAGNOSIS — E785 Hyperlipidemia, unspecified: Secondary | ICD-10-CM | POA: Diagnosis not present

## 2018-01-28 LAB — COMPREHENSIVE METABOLIC PANEL
ALBUMIN: 4.1 g/dL (ref 3.5–5.5)
ALK PHOS: 105 IU/L (ref 39–117)
ALT: 22 IU/L (ref 0–32)
AST: 24 IU/L (ref 0–40)
Albumin/Globulin Ratio: 1.5 (ref 1.2–2.2)
BILIRUBIN TOTAL: 0.4 mg/dL (ref 0.0–1.2)
BUN/Creatinine Ratio: 16 (ref 9–23)
BUN: 11 mg/dL (ref 6–24)
CHLORIDE: 104 mmol/L (ref 96–106)
CO2: 25 mmol/L (ref 20–29)
Calcium: 9.2 mg/dL (ref 8.7–10.2)
Creatinine, Ser: 0.67 mg/dL (ref 0.57–1.00)
GFR calc Af Amer: 112 mL/min/{1.73_m2} (ref >59)
GFR calc non Af Amer: 97 mL/min/{1.73_m2} (ref >59)
GLUCOSE: 86 mg/dL (ref 65–99)
Globulin, Total: 2.7 g/dL (ref 1.5–4.5)
Potassium: 4.1 mmol/L (ref 3.5–5.2)
SODIUM: 144 mmol/L (ref 134–144)
Total Protein: 6.8 g/dL (ref 6.0–8.5)

## 2018-01-28 LAB — LIPID PANEL
Chol/HDL Ratio: 4.8 ratio — ABNORMAL HIGH (ref 0.0–4.4)
Cholesterol, Total: 200 mg/dL — ABNORMAL HIGH (ref 100–199)
HDL: 42 mg/dL (ref >39)
LDL CALC: 135 mg/dL — AB (ref 0–99)
Triglycerides: 117 mg/dL (ref 0–149)
VLDL CHOLESTEROL CAL: 23 mg/dL (ref 5–40)

## 2018-01-31 ENCOUNTER — Ambulatory Visit (HOSPITAL_COMMUNITY)
Admission: RE | Admit: 2018-01-31 | Discharge: 2018-01-31 | Disposition: A | Discharge status: Home / Self Care | Payer: BLUE CROSS/BLUE SHIELD | Source: Ambulatory Visit | Attending: Cardiology | Admitting: Cardiology

## 2018-01-31 DIAGNOSIS — G459 Transient cerebral ischemic attack, unspecified: Secondary | ICD-10-CM | POA: Diagnosis not present

## 2018-02-03 ENCOUNTER — Telehealth: Payer: Self-pay

## 2018-02-03 DIAGNOSIS — E785 Hyperlipidemia, unspecified: Secondary | ICD-10-CM

## 2018-02-03 MED ORDER — ATORVASTATIN CALCIUM 20 MG PO TABS: 20.0000 mg | ORAL_TABLET | Freq: Every day | ORAL | 3 refills | Status: DC

## 2018-02-03 NOTE — Telephone Encounter (Signed)
Called patient with results. Patient verbalized understanding and agreed w/ plan. Rx(s) sent to preferred pharmacy electronically. Repeat lab ordered and mailed to patient.

## 2018-02-03 NOTE — Telephone Encounter (Signed)
-----   Message from Sanda Klein, MD sent at 01/30/2018  9:51 PM EST ----- Routine labs are OK. LDL-C is high. Recommend atorvastatin 20 mg and check lipids in 3 months please.

## 2018-02-04 ENCOUNTER — Ambulatory Visit (INDEPENDENT_AMBULATORY_CARE_PROVIDER_SITE_OTHER): Payer: BLUE CROSS/BLUE SHIELD

## 2018-02-04 ENCOUNTER — Telehealth: Payer: Self-pay | Admitting: Cardiovascular Disease

## 2018-02-04 ENCOUNTER — Other Ambulatory Visit: Payer: Self-pay | Admitting: Cardiovascular Disease

## 2018-02-04 DIAGNOSIS — I4891 Unspecified atrial fibrillation: Secondary | ICD-10-CM | POA: Diagnosis not present

## 2018-02-04 DIAGNOSIS — G459 Transient cerebral ischemic attack, unspecified: Secondary | ICD-10-CM | POA: Diagnosis not present

## 2018-02-04 NOTE — Telephone Encounter (Signed)
New Message         Patient had an ultra sound recently and she states there were no findings and she wanted to know is she should still pick up the holter monitor.

## 2018-02-04 NOTE — Telephone Encounter (Signed)
Returned call to pt and appt for monitor is still there so she will be there to pick it up at 4pm

## 2018-03-25 ENCOUNTER — Ambulatory Visit: Payer: BLUE CROSS/BLUE SHIELD | Admitting: Physician Assistant

## 2018-03-25 ENCOUNTER — Encounter: Payer: Self-pay | Admitting: Physician Assistant

## 2018-03-25 VITALS — BP 137/83 | HR 83 | Ht 60.0 in | Wt 145.4 lb

## 2018-03-25 DIAGNOSIS — R531 Weakness: Secondary | ICD-10-CM | POA: Diagnosis not present

## 2018-03-25 DIAGNOSIS — I701 Atherosclerosis of renal artery: Secondary | ICD-10-CM

## 2018-03-25 DIAGNOSIS — E785 Hyperlipidemia, unspecified: Secondary | ICD-10-CM

## 2018-03-25 NOTE — Patient Instructions (Signed)
Medication Instructions:  The current medical regimen is effective;  continue present plan and medications.  If you need a refill on your cardiac medications before your next appointment, please call your pharmacy.   Lab work: Fasting Lipid/Hepatic in 2 months If you have labs (blood work) drawn today and your tests are completely normal, you will receive your results only by: Marland Kitchen MyChart Message (if you have MyChart) OR . A paper copy in the mail If you have any lab test that is abnormal or we need to change your treatment, we will call you to review the results.  Follow-Up: At [facility], you and your health needs are our priority.  As part of our continuing mission to provide you with exceptional heart care, we have created designated Provider Care Teams.  These Care Teams include your primary Cardiologist (physician) and Advanced Practice Providers (APPs -  Physician Assistants and Nurse Practitioners) who all work together to provide you with the care you need, when you need it. You will need a follow up appointment in 6 months.  Please call our office 2 months in advance to schedule this appointment.  You may see Sanda Klein, MD or one of the following Advanced Practice Providers on your designated Care Team: [place], [place] . Fabian Sharp, PA-C

## 2018-03-25 NOTE — Progress Notes (Signed)
Cardiology Office Note    Date:  03/27/2018   ID:  Sheri Perkins, DOB [birth year], MRN [id number]  PCP:  Enid Skeens., MD  Cardiologist:  Dr. Sallyanne Kuster  Chief Complaint  Patient presents with  . Follow-up    seen for Dr. Sallyanne Kuster    History of Present Illness:  Sheri Perkins is a 58 y.o. female with PMH of right renal artery stenosis presumed secondary to fibromuscular dysplasia, neurally mediated syncope, history of smoking and mild hyperlipidemia.  Patient was last seen by Dr. Sallyanne Kuster on 01/25/2018, she describes symptoms concerning for TIA.  She was instructed to start on 81 mg aspirin.  Carotid Doppler and a 30-day event monitor was ordered.  Carotid Doppler obtained on 01/31/2018 showed 1 to 39% bilateral ICA stenosis, less than 50% plaque noted in common carotid artery on the right side.  Event monitor did not show significant arrhythmia.  Patient presents today for cardiology office visit, she denies any recent palpitation or significant chest discomfort.  She has no lower extremity edema, orthopnea or PND.  She is due for fasting lipid panel and liver function test in 61-month, otherwise she can follow-up with Dr. Sallyanne Kuster in 6 months.    Past Medical History:  Diagnosis Date  . Acute bronchiolitis   . Allergy   . Arthritis    fingers , back, neck   . Hyperlipidemia   . Renal artery stenosis (HCC) 04/03/2011   right 1-59% proximal reduction,left main 1-59% reduction,  . Renovascular hypertension   . Tobacco abuse     Past Surgical History:  Procedure Laterality Date  . arm surgery Bilateral   . CARPAL TUNNEL RELEASE Bilateral   . COLONOSCOPY    . HAND SURGERY    . KNEE SURGERY    . POLYPECTOMY    . SHOULDER SURGERY Left   . US ECHOCARDIOGRAPHY  06/16/2010   normal    Current Medications: Outpatient Medications Prior to Visit  Medication Sig Dispense Refill  . aspirin 81 MG tablet Take 1 tablet (81 mg total) by mouth daily. 30 tablet   . atorvastatin  (LIPITOR) 20 MG tablet Take 1 tablet (20 mg total) by mouth daily. 90 tablet 3  . losartan (COZAAR) 50 MG tablet TAKE 1/2 (ONE-HALF) TABLET BY MOUTH TWICE DAILY 30 tablet 11   No facility-administered medications prior to visit.      Allergies:   Macrobid [nitrofurantoin macrocrystal]; Latex; and Sulfa antibiotics   Social History   Socioeconomic History  . Marital status: Married    Spouse name: Not on file  . Number of children: Not on file  . Years of education: Not on file  . Highest education level: Not on file  Occupational History  . Not on file  Social Needs  . Financial resource strain: Not on file  . Food insecurity:    Worry: Not on file    Inability: Not on file  . Transportation needs:    Medical: Not on file    Non-medical: Not on file  Tobacco Use  . Smoking status: Former Smoker    Packs/day: 0.12    Types: Cigarettes    Last attempt to quit: 04/03/2017    Years since quitting: 0.9  . Smokeless tobacco: Never Used  Substance and Sexual Activity  . Alcohol use: No    Alcohol/week: 0.0 standard drinks  . Drug use: No  . Sexual activity: Not on file  Lifestyle  . Physical activity:    Days  per week: Not on file    Minutes per session: Not on file  . Stress: Not on file  Relationships  . Social connections:    Talks on phone: Not on file    Gets together: Not on file    Attends religious service: Not on file    Active member of club or organization: Not on file    Attends meetings of clubs or organizations: Not on file    Relationship status: Not on file  Other Topics Concern  . Not on file  Social History Narrative  . Not on file     Family History:  The patient's family history includes Melanoma in her sister.   ROS:   Please see the history of present illness.    ROS All other systems reviewed and are negative.   PHYSICAL EXAM:   VS:  BP 137/83   Pulse 83   Ht 5' (1.524 m)   Wt 145 lb 6.4 oz (66 kg)   BMI 28.40 kg/m    GEN: Well  nourished, well developed, in no acute distress  HEENT: normal  Neck: no JVD, carotid bruits, or masses Cardiac: RRR; no murmurs, rubs, or gallops,no edema  Respiratory:  clear to auscultation bilaterally, normal work of breathing GI: soft, nontender, nondistended, + BS MS: no deformity or atrophy  Skin: warm and dry, no rash Neuro:  Alert and Oriented x 3, Strength and sensation are intact Psych: euthymic mood, full affect  Wt Readings from Last 3 Encounters:  03/25/18 145 lb 6.4 oz (66 kg)  01/25/18 143 lb 12.8 oz (65.2 kg)  06/21/17 146 lb (66.2 kg)      Studies/Labs Reviewed:   EKG:  EKG is not ordered today.   Recent Labs: 04/02/2017: Hemoglobin 11.1; Platelets 239 01/28/2018: ALT 22; BUN 11; Creatinine, Ser 0.67; Potassium 4.1; Sodium 144   Lipid Panel    Component Value Date/Time   CHOL 200 (H) 01/28/2018 0946   TRIG 117 01/28/2018 0946   HDL 42 01/28/2018 0946   CHOLHDL 4.8 (H) 01/28/2018 0946   CHOLHDL 4.1 05/10/2015 0910   VLDL 15 05/10/2015 0910   LDLCALC 135 (H) 01/28/2018 0946    Additional studies/ records that were reviewed today include:   Carotid Doppler 01/31/2018 Summary: Right Carotid: Velocities in the right ICA are consistent with a 1-39% stenosis.                Non-hemodynamically significant plaque <50% noted in the CCA.  Left Carotid: Velocities in the left ICA are consistent with a 1-39% stenosis.               Non-hemodynamically significant plaque noted in the CCA.  Vertebrals:  Bilateral vertebral arteries demonstrate antegrade flow. Subclavians: Right subclavian artery flow was disturbed. Normal flow              hemodynamics were seen in the left subclavian artery.   Renal artery duplex 02/03/2013     ASSESSMENT:    1. Weakness   2. Hyperlipidemia, unspecified hyperlipidemia type   3. Renal artery stenosis (HCC)      PLAN:  In order of problems listed above:  1. Ipsilateral weakness: symptom improved. Carotid U/S did  not show significant disease. Event monitor  was negative for significant arrhythmia.  2. Hyperlipidemia: Due for fasting lipid panel and LFT in 2 months.  3. Renal artery stenosis: Last Doppler was obtained in November 2014.  Due for repeat.    Medication Adjustments/Labs  and Tests Ordered: Current medicines are reviewed at length with the patient today.  Concerns regarding medicines are outlined above.  Medication changes, Labs and Tests ordered today are listed in the Patient Instructions below. Patient Instructions  Medication Instructions:  The current medical regimen is effective;  continue present plan and medications.  If you need a refill on your cardiac medications before your next appointment, please call your pharmacy.   Lab work: Fasting Lipid/Hepatic in 2 months If you have labs (blood work) drawn today and your tests are completely normal, you will receive your results only by: Marland Kitchen MyChart Message (if you have MyChart) OR . A paper copy in the mail If you have any lab test that is abnormal or we need to change your treatment, we will call you to review the results.  Follow-Up: At [facility], you and your health needs are our priority.  As part of our continuing mission to provide you with exceptional heart care, we have created designated Provider Care Teams.  These Care Teams include your primary Cardiologist (physician) and Advanced Practice Providers (APPs -  Physician Assistants and Nurse Practitioners) who all work together to provide you with the care you need, when you need it. You will need a follow up appointment in 6 months.  Please call our office 2 months in advance to schedule this appointment.  You may see Sanda Klein, MD or one of the following Advanced Practice Providers on your designated Care Team: [place], [place] . Fabian Sharp, PA-C       Signed, [place], [place]  03/27/2018 10:44 PM    [facility] [address] [place],  [place],   [address] Phone: [phone]; Fax: [phone]

## 2018-03-27 ENCOUNTER — Encounter: Payer: Self-pay | Admitting: Physician Assistant

## 2018-04-22 DIAGNOSIS — M79671 Pain in right foot: Secondary | ICD-10-CM | POA: Diagnosis not present

## 2018-04-22 DIAGNOSIS — M7741 Metatarsalgia, right foot: Secondary | ICD-10-CM | POA: Diagnosis not present

## 2018-04-22 DIAGNOSIS — Z6828 Body mass index (BMI) 28.0-28.9, adult: Secondary | ICD-10-CM | POA: Diagnosis not present

## 2018-04-25 DIAGNOSIS — J111 Influenza due to unidentified influenza virus with other respiratory manifestations: Secondary | ICD-10-CM | POA: Diagnosis not present

## 2018-04-30 DIAGNOSIS — J411 Mucopurulent chronic bronchitis: Secondary | ICD-10-CM | POA: Diagnosis not present

## 2018-05-11 DIAGNOSIS — G459 Transient cerebral ischemic attack, unspecified: Secondary | ICD-10-CM | POA: Diagnosis not present

## 2018-06-02 ENCOUNTER — Telehealth: Payer: Self-pay

## 2018-06-02 NOTE — Telephone Encounter (Signed)
Called patient to follow-up with her about her 2 month labs that need to be done this month

## 2018-06-15 DIAGNOSIS — J209 Acute bronchitis, unspecified: Secondary | ICD-10-CM | POA: Diagnosis not present

## 2018-06-15 DIAGNOSIS — J0100 Acute maxillary sinusitis, unspecified: Secondary | ICD-10-CM | POA: Diagnosis not present

## 2018-07-07 ENCOUNTER — Telehealth: Payer: Self-pay

## 2018-07-07 NOTE — Telephone Encounter (Signed)
Unable to get in contact with the patient to convert their office visit into a webex visit. I left them a voicemail asking to return my call. Office number was provided.   

## 2018-07-08 NOTE — Telephone Encounter (Signed)
Pt called in and stated she doesn't have access to video nor does she have a smart phone.

## 2018-07-11 NOTE — Telephone Encounter (Signed)
Pt called and consented to a Virtual Visit and for the insurance to be billed as such. Pt was informed if they have any copays they would have the bill sent to them. Pt stated that she is going to confirm everythinf with her husband and if they are not able to do the Virtual Visit he would call to r/s her appt. due to her getting off after we close. Email confirmed.

## 2018-07-11 NOTE — Telephone Encounter (Signed)
LVM asking for the patient return my call. Office number was provided.

## 2018-07-12 NOTE — Telephone Encounter (Signed)
Will need to get another email or confirmed pts email, it did not deliver.  RN tried to set up video visit with email provided. The email came back as nondelivered. [email or web address] [email or web address]>: host [email or web address][[phone]] said: 554     delivery error: dd Sorry, your message to [email or web address] cannot be     delivered. This mailbox is disabled (554.30). - [email or web address]     (in reply to end of DATA command)

## 2018-07-13 ENCOUNTER — Other Ambulatory Visit: Payer: Self-pay

## 2018-07-13 NOTE — Telephone Encounter (Addendum)
I called pts husband about video visit with his wife tomorrow. I stated his yahoo came back undelivered. The husband stated his email is [email or web address]. I advise did he or his wife download the web ex on his phone. He stated his wife was at work. He would not be able or understand how to download the web ex. I advise pt I can assist and help him download the app on his phone. The husband stated "lets just reschedule I dont know how to download". I advise pts husband that we will probably be doing video visits in May and June based on COVID 19. I advise pts husband to call back and r/s in the next 4 to 6 weeks to r/s visit. I stated if they call back and we are not doing office visits it will be video. THe husband verbalized understanding.

## 2018-07-14 ENCOUNTER — Ambulatory Visit: Payer: Self-pay | Admitting: Neurology

## 2018-07-19 ENCOUNTER — Telehealth: Payer: Self-pay | Admitting: *Deleted

## 2018-07-19 NOTE — Telephone Encounter (Signed)
07/19/18 LMOM @ 0943 am,re: follow appointment.

## 2018-08-18 ENCOUNTER — Telehealth: Payer: Self-pay | Admitting: Cardiovascular Disease

## 2018-08-18 NOTE — Telephone Encounter (Signed)
lmtcb-please schedule 6 month fu visit with Dr. Loletha Grayer.  He has opening June 11 and June 12/dc

## 2018-08-25 ENCOUNTER — Encounter: Payer: Self-pay | Admitting: Neurology

## 2018-08-25 ENCOUNTER — Other Ambulatory Visit: Payer: Self-pay

## 2018-08-25 ENCOUNTER — Ambulatory Visit (INDEPENDENT_AMBULATORY_CARE_PROVIDER_SITE_OTHER): Payer: BC Managed Care – PPO | Admitting: Neurology

## 2018-08-25 VITALS — BP 110/68 | HR 82 | Temp 98.0°F | Ht 60.0 in | Wt 144.6 lb

## 2018-08-25 DIAGNOSIS — G4440 Drug-induced headache, not elsewhere classified, not intractable: Secondary | ICD-10-CM

## 2018-08-25 DIAGNOSIS — G44209 Tension-type headache, unspecified, not intractable: Secondary | ICD-10-CM

## 2018-08-25 DIAGNOSIS — G459 Transient cerebral ischemic attack, unspecified: Secondary | ICD-10-CM | POA: Diagnosis not present

## 2018-08-25 MED ORDER — CLOPIDOGREL BISULFATE 75 MG PO TABS: 75.0000 mg | ORAL_TABLET | Freq: Every day | ORAL | Status: DC

## 2018-08-25 MED ORDER — TOPIRAMATE 25 MG PO TABS: 25.0000 mg | ORAL_TABLET | Freq: Two times a day (BID) | ORAL | 2 refills | Status: DC

## 2018-08-25 NOTE — Progress Notes (Signed)
[facility] [address] [place]. [place] [address] [phone]       OFFICE CONSULT NOTE  Ms. Sheri Perkins Date of Birth:  [birth year] Medical Record Number:  [id number]   Referring MD:  Sheri Perkins  Reason for Referral:  TIA  HPI: [name] is a 58 year old pleasant Caucasian lady seen today for initial office consultation visit for TIA and chronic headaches.  History is obtained from the patient and review of referral notes.  She states that sometime in January or February she had a brief episode of sudden onset of left face arm and leg tingling and numbness.  This lasted about 30 minutes.  She had a mild headache which was not disabling.  She did not have any significant weakness and was able to walk fine at that time.  She did not have any slurred speech.  She checked her blood pressure at that time it was fine.  She did not seek medical help right away.  She mentioned this several weeks later with a primary care visit.  She did see a cardiologist in March who ordered carotid ultrasound which I have personally reviewed showed no significant extracranial stenosis as well as 30-day heart monitor which was negative for significant arrhythmias.  Patient has not had any recurrent TIA or stroke symptoms.  She denies slurred speech, extremity weakness, gait or balance problems.  Review of her electronic medical records show that she had an MRI scan of the brain and MRA of the brain which was normal in 2006 at that time she had episode of dizziness lightheadedness and passing out.  The patient also complains of her chronic daily headaches which she is had for 2 years.  She describes a headache present mostly bifrontally dull in 6/10 in severity occasionally throbbing.  The headaches are noticed during the day after going to work every day.  She has been taking now 4 tablets of Tylenol every day prior to going to work in the past she was taking 4 tablets of ibuprofen.  She has  not tried any medication for headache prophylaxis.  She does complain of tightness of the muscles of her neck.  She does not do any regular neck stretching exercises.  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  She has no prior history of strokes or TIAs.  She does smoke 1 pack/day.  ROS:   14 system review of systems is positive for headache, neck pain, muscle tightness, numbness and all other systems negative  PMH:  Past Medical History:  Diagnosis Date   Acute bronchiolitis    Allergy    Arthritis    fingers , back, neck    Hyperlipidemia    Renal artery stenosis (HCC) 04/03/2011   right 1-59% proximal reduction,left main 1-59% reduction,   Renovascular hypertension    TIA (transient ischemic attack)    Tobacco abuse     Social History:  Social History   Socioeconomic History   Marital status: Married    Spouse name: Not on file   Number of children: Not on file   Years of education: Not on file   Highest education level: Not on file  Occupational History   Not on file  Social Needs   Financial resource strain: Not on file   Food insecurity:    Worry:  Not on file    Inability: Not on file   Transportation needs:    Medical: Not on file    Non-medical: Not on file  Tobacco Use   Smoking status: Former Smoker    Packs/day: 0.12    Types: Cigarettes    Last attempt to quit: 04/03/2017    Years since quitting: 1.3   Smokeless tobacco: Never Used  Substance and Sexual Activity   Alcohol use: No    Alcohol/week: 0.0 standard drinks   Drug use: No   Sexual activity: Not on file  Lifestyle   Physical activity:    Days per week: Not on file    Minutes per session: Not on file   Stress: Not on file  Relationships   Social connections:    Talks on phone: Not on file    Gets together: Not on file     Attends religious service: Not on file    Active member of club or organization: Not on file    Attends meetings of clubs or organizations: Not on file    Relationship status: Not on file   Intimate partner violence:    Fear of current or ex partner: Not on file    Emotionally abused: Not on file    Physically abused: Not on file    Forced sexual activity: Not on file  Other Topics Concern   Not on file  Social History Narrative   Not on file    Medications:   Current Outpatient Medications on File Prior to Visit  Medication Sig Dispense Refill   atorvastatin (LIPITOR) 20 MG tablet Take 1 tablet (20 mg total) by mouth daily. 90 tablet 3   losartan (COZAAR) 50 MG tablet TAKE 1/2 (ONE-HALF) TABLET BY MOUTH TWICE DAILY 30 tablet 11   No current facility-administered medications on file prior to visit.     Allergies:   Allergies  Allergen Reactions   Macrobid [Nitrofurantoin Macrocrystal] Other (See Comments)    "burned lungs"   Latex Itching        Sulfa Antibiotics Hives and Rash    Physical Exam General: well developed, well nourished middle-aged Caucasian lady, seated, in no evident distress Head: head normocephalic and atraumatic.   Neck: supple with no carotid or supraclavicular bruits Cardiovascular: regular rate and rhythm, no murmurs Musculoskeletal: no deformity.  Mild tightness of posterior cervical muscles with slight restriction of lateral movements. Skin:  no rash/petichiae Vascular:  Normal pulses all extremities  Neurologic Exam Mental Status: Awake and fully alert. Oriented to place and time. Recent and remote memory intact. Attention span, concentration and fund of knowledge appropriate. Mood and affect appropriate.  Cranial Nerves: Fundoscopic exam reveals sharp disc margins. Pupils equal, briskly reactive to light. Extraocular movements full without nystagmus. Visual fields full to confrontation. Hearing intact. Facial sensation intact. Face,  tongue, palate moves normally and symmetrically.  Motor: Normal bulk and tone. Normal strength in all tested extremity muscles. Sensory.: intact to touch , pinprick , position and vibratory sensation.  Coordination: Rapid alternating movements normal in all extremities. Finger-to-nose and heel-to-shin performed accurately bilaterally. Gait and Station: Arises from chair without difficulty. Stance is normal. Gait demonstrates normal stride length and balance . Able to heel, toe and tandem walk without difficulty.  Reflexes: 1+ and symmetric. Toes downgoing.   NIHSS  0 Modified Rankin  1   ASSESSMENT: 58 year old patient with transient episode of left face arm and leg paresthesias in January February 2020 likely right hemispheric subcortical  TIA from small vessel disease.  Vascular risk factors of smoking , hypertension and hyperlipidemia.  She also has chronic daily headaches which likely represent transformed muscle tension headaches with analgesic rebound    PLAN: I had a long d/w patient about her recent TIA, tension headaches and analgesic rebound, risk for recurrent stroke/TIAs, personally independently reviewed imaging studies and stroke evaluation results and answered questions.The patient was counseled on the dangers of tobacco use, and was .  Reviewed strategies to maximize success, including removing cigarettes and smoking materials from environment, stress management, substitution of other forms of reinforcement and support of family/friends. Stop Aspirin and change to Plavix 75 mg daily for secondary stroke prevention and maintain strict control of hypertension with blood pressure goal below 130/90, diabetes with hemoglobin A1c goal below 6.5% and lipids with LDL cholesterol goal below 70 mg/dL. I also advised the patient to eat a healthy diet with plenty of whole grains, cereals, fruits and vegetables, exercise regularly and maintain ideal body weight.  She was advised to do regular neck  stretching exercises and participate in stress relaxation activities like regular exercise, swimming, meditation and yoga.  She was advised to discontinue daily Tylenol to reduce analgesic rebound headache and instead start Topamax 25 mg twice daily for headache prevention.  Greater than 50% time during this 45-minute consultation visit was spent on counseling and coordination of care about her possible TIA as well as tension headache and answering questions.  followup in the future with me in 2 months or call earlier if necessary Antony Contras, MD Note: This document was prepared with digital dictation and possible smart phrase technology. Any transcriptional errors that result from this process are unintentional.

## 2018-08-25 NOTE — Patient Instructions (Signed)
I had a long d/w patient about her recent TIA, tension headaches and analgesic rebound, risk for recurrent stroke/TIAs, personally independently reviewed imaging studies and stroke evaluation results and answered questions.The patient was counseled on the dangers of tobacco use, and was .  Reviewed strategies to maximize success, including removing cigarettes and smoking materials from environment, stress management, substitution of other forms of reinforcement and support of family/friends. Stop Aspirin and change to Plavix 75 mg daily for secondary stroke prevention and maintain strict control of hypertension with blood pressure goal below 130/90, diabetes with hemoglobin A1c goal below 6.5% and lipids with LDL cholesterol goal below 70 mg/dL. I also advised the patient to eat a healthy diet with plenty of whole grains, cereals, fruits and vegetables, exercise regularly and maintain ideal body weight.  She was advised to do regular neck stretching exercises and participate in stress relaxation activities like regular exercise, swimming, meditation and yoga.  She was advised to discontinue daily Tylenol to reduce analgesic rebound headache and instead start Topamax 25 mg twice daily for headache prevention.  Followup in the future with me in 2 months or call earlier if necessary  Neck Exercises Neck exercises can be important for many reasons:  They can help you to improve and maintain flexibility in your neck. This can be especially important as you age.  They can help to make your neck stronger. This can make movement easier.  They can reduce or prevent neck pain.  They may help your upper back. Ask your health care provider which neck exercises would be best for you. Exercises to improve neck flexibility Neck stretch Repeat this exercise 3-5 times. 1. Do this exercise while standing or while sitting in a chair. 2. Place your feet flat on the floor, shoulder-width apart. 3. Slowly turn your head  to the right. Turn it all the way to the right so you can look over your right shoulder. Do not tilt or tip your head. 4. Hold this position for 10-30 seconds. 5. Slowly turn your head to the left, to look over your left shoulder. 6. Hold this position for 10-30 seconds.  Neck retraction Repeat this exercise 8-10 times. Do this 3-4 times a day or as told by your health care provider. 1. Do this exercise while standing or while sitting in a sturdy chair. 2. Look straight ahead. Do not bend your neck. 3. Use your fingers to push your chin backward. Do not bend your neck for this movement. Continue to face straight ahead. If you are doing the exercise properly, you will feel a slight sensation in your throat and a stretch at the back of your neck. 4. Hold the stretch for 1-2 seconds. Relax and repeat. Exercises to improve neck strength Neck press Repeat this exercise 10 times. Do it first thing in the morning and right before bed or as told by your health care provider. 1. Lie on your back on a firm bed or on the floor with a pillow under your head. 2. Use your neck muscles to push your head down on the pillow and straighten your spine. 3. Hold the position as well as you can. Keep your head facing up and your chin tucked. 4. Slowly count to 5 while holding this position. 5. Relax for a few seconds. Then repeat. Isometric strengthening Do a full set of these exercises 2 times a day or as told by your health care provider. 1. Sit in a supportive chair and place your hand  on your forehead. 2. Push forward with your head and neck while pushing back with your hand. Hold for 10 seconds. 3. Relax. Then repeat the exercise 3 times. 4. Next, do thesequence again, this time putting your hand against the back of your head. Use your head and neck to push backward against the hand pressure. 5. Finally, do the same exercise on either side of your head, pushing sideways against the pressure of your hand.  Prone head lifts Repeat this exercise 5 times. Do this 2 times a day or as told by your health care provider. 1. Lie face-down, resting on your elbows so that your chest and upper back are raised. 2. Start with your head facing downward, near your chest. Position your chin either on or near your chest. 3. Slowly lift your head upward. Lift until you are looking straight ahead. Then continue lifting your head as far back as you can stretch. 4. Hold your head up for 5 seconds. Then slowly lower it to your starting position. Supine head lifts Repeat this exercise 8-10 times. Do this 2 times a day or as told by your health care provider. 1. Lie on your back, bending your knees to point to the ceiling and keeping your feet flat on the floor. 2. Lift your head slowly off the floor, raising your chin toward your chest. 3. Hold for 5 seconds. 4. Relax and repeat. Scapular retraction Repeat this exercise 5 times. Do this 2 times a day or as told by your health care provider. 1. Stand with your arms at your sides. Look straight ahead. 2. Slowly pull both shoulders backward and downward until you feel a stretch between your shoulder blades in your upper back. 3. Hold for 10-30 seconds. 4. Relax and repeat. Contact a health care provider if:  Your neck pain or discomfort gets much worse when you do an exercise.  Your neck pain or discomfort does not improve within 2 hours after you exercise. If you have any of these problems, stop exercising right away. Do not do the exercises again unless your health care provider says that you can. Get help right away if:  You develop sudden, severe neck pain. If this happens, stop exercising right away. Do not do the exercises again unless your health care provider says that you can. This information is not intended to replace advice given to you by your health care provider. Make sure you discuss any questions you have with your health care provider. Document  Released: 02/18/2015 Document Revised: 07/13/2017 Document Reviewed: 09/17/2014 Elsevier Interactive Patient Education  2019 [employer].  Stroke Prevention Some medical conditions and behaviors are associated with a higher chance of having a stroke. You can help prevent a stroke by making nutrition, lifestyle, and other changes, including managing any medical conditions you may have. What nutrition changes can be made?   Eat healthy foods. You can do this by: ? Choosing foods high in fiber, such as fresh fruits and vegetables and whole grains. ? Eating at least 5 or more servings of fruits and vegetables a day. Try to fill half of your plate at each meal with fruits and vegetables. ? Choosing lean protein foods, such as lean cuts of meat, poultry without skin, fish, tofu, beans, and nuts. ? Eating low-fat dairy products. ? Avoiding foods that are high in salt (sodium). This can help lower blood pressure. ? Avoiding foods that have saturated fat, trans fat, and cholesterol. This can help prevent high cholesterol. ?  Avoiding processed and premade foods.  Follow your health care provider's specific guidelines for losing weight, controlling high blood pressure (hypertension), lowering high cholesterol, and managing diabetes. These may include: ? Reducing your daily calorie intake. ? Limiting your daily sodium intake to 1,500 milligrams (mg). ? Using only healthy fats for cooking, such as olive oil, canola oil, or sunflower oil. ? Counting your daily carbohydrate intake. What lifestyle changes can be made?  Maintain a healthy weight. Talk to your health care provider about your ideal weight.  Get at least 30 minutes of moderate physical activity at least 5 days a week. Moderate activity includes brisk walking, biking, and swimming.  Do not use any products that contain nicotine or tobacco, such as cigarettes and e-cigarettes. If you need help quitting, ask your health care provider. It may  also be helpful to avoid exposure to secondhand smoke.  Limit alcohol intake to no more than 1 drink a day for nonpregnant women and 2 drinks a day for men. One drink equals 12 oz of beer, 5 oz of wine, or 1 oz of hard liquor.  Stop any illegal drug use.  Avoid taking birth control pills. Talk to your health care provider about the risks of taking birth control pills if: ? You are over 15 years old. ? You smoke. ? You get migraines. ? You have ever had a blood clot. What other changes can be made?  Manage your cholesterol levels. ? Eating a healthy diet is important for preventing high cholesterol. If cholesterol cannot be managed through diet alone, you may also need to take medicines. ? Take any prescribed medicines to control your cholesterol as told by your health care provider.  Manage your diabetes. ? Eating a healthy diet and exercising regularly are important parts of managing your blood sugar. If your blood sugar cannot be managed through diet and exercise, you may need to take medicines. ? Take any prescribed medicines to control your diabetes as told by your health care provider.  Control your hypertension. ? To reduce your risk of stroke, try to keep your blood pressure below 130/80. ? Eating a healthy diet and exercising regularly are an important part of controlling your blood pressure. If your blood pressure cannot be managed through diet and exercise, you may need to take medicines. ? Take any prescribed medicines to control hypertension as told by your health care provider. ? Ask your health care provider if you should monitor your blood pressure at home. ? Have your blood pressure checked every year, even if your blood pressure is normal. Blood pressure increases with age and some medical conditions.  Get evaluated for sleep disorders (sleep apnea). Talk to your health care provider about getting a sleep evaluation if you snore a lot or have excessive sleepiness.   Take over-the-counter and prescription medicines only as told by your health care provider. Aspirin or blood thinners (antiplatelets or anticoagulants) may be recommended to reduce your risk of forming blood clots that can lead to stroke.  Make sure that any other medical conditions you have, such as atrial fibrillation or atherosclerosis, are managed. What are the warning signs of a stroke? The warning signs of a stroke can be easily remembered as BEFAST.  B is for balance. Signs include: ? Dizziness. ? Loss of balance or coordination. ? Sudden trouble walking.  E is for eyes. Signs include: ? A sudden change in vision. ? Trouble seeing.  F is for face. Signs include: ? Sudden  weakness or numbness of the face. ? The face or eyelid drooping to one side.  A is for arms. Signs include: ? Sudden weakness or numbness of the arm, usually on one side of the body.  S is for speech. Signs include: ? Trouble speaking (aphasia). ? Trouble understanding.  T is for time. ? These symptoms may represent a serious problem that is an emergency. Do not wait to see if the symptoms will go away. Get medical help right away. Call your local emergency services (911 in the [place] Do not drive yourself to the hospital.  Other signs of stroke may include: ? A sudden, severe headache with no known cause. ? Nausea or vomiting. ? Seizure. Where to find more information For more information, visit:  American Stroke Association: www.strokeassociation.org  National Stroke Association: www.stroke.org Summary  You can prevent a stroke by eating healthy, exercising, not smoking, limiting alcohol intake, and managing any medical conditions you may have.  Do not use any products that contain nicotine or tobacco, such as cigarettes and e-cigarettes. If you need help quitting, ask your health care provider. It may also be helpful to avoid exposure to secondhand smoke.  Remember BEFAST for warning signs of  stroke. Get help right away if you or a loved one has any of these signs. This information is not intended to replace advice given to you by your health care provider. Make sure you discuss any questions you have with your health care provider. Document Released: 04/16/2004 Document Revised: 04/14/2016 Document Reviewed: 04/14/2016 Elsevier Interactive Patient Education  2019 [employer].

## 2018-08-26 LAB — LIPID PANEL
Chol/HDL Ratio: 2.7 ratio (ref 0.0–4.4)
Cholesterol, Total: 126 mg/dL (ref 100–199)
HDL: 47 mg/dL (ref >39)
LDL Calculated: 61 mg/dL (ref 0–99)
Triglycerides: 90 mg/dL (ref 0–149)
VLDL Cholesterol Cal: 18 mg/dL (ref 5–40)

## 2018-08-26 LAB — HEMOGLOBIN A1C
Est. average glucose Bld gHb Est-mCnc: 108 mg/dL
Hgb A1c MFr Bld: 5.4 % (ref 4.8–5.6)

## 2018-08-30 ENCOUNTER — Telehealth: Payer: Self-pay | Admitting: Neurology

## 2018-08-30 NOTE — Telephone Encounter (Signed)
[employer] pending faxed notes.  

## 2018-08-31 ENCOUNTER — Other Ambulatory Visit: Payer: Self-pay | Admitting: Cardiovascular Disease

## 2018-09-01 ENCOUNTER — Telehealth: Payer: Self-pay

## 2018-09-01 NOTE — Telephone Encounter (Signed)
I was calling pt about his appt will be change to mychart video visit. The pts wife Jacuque answer the phone. I explain due to the COVID 19 pandemic we are doing video for now. Elsie Amis ask her husband and he gave consent to do video and to file insurance. I explain that pt already has a mychart account and click on appt and answer the required questions and the video will start. The wife stated she has a computer with camera and verbalized understanding.

## 2018-09-01 NOTE — Telephone Encounter (Signed)
I called pt that his visit will be change to virtual mychart visit due to COVID 19. PT gave verbal consent to do video and to file insurance. Pt has a mychart account and knows how to access it. He also has a smart phone with camera he will be using. I explain that he will log in mychart 10 minutes prior to visit, answer the security questions and video will start. Pt verbalized understanding.

## 2018-09-01 NOTE — Telephone Encounter (Signed)
I called pt that her cholesterol profile and screening for diabetes were both satisfactory. Pt was not at home, message was left with husband to have pt call back.  ------

## 2018-09-01 NOTE — Telephone Encounter (Signed)
-----   Message from Garvin Fila, MD sent at 08/26/2018 11:35 AM EDT ----- Sheri Perkins inform the patient that cholesterol profile and screening test for diabetes were both satisfactory

## 2018-09-02 NOTE — Telephone Encounter (Signed)
Pt called, pt made aware of entry. No call back requested

## 2018-09-06 ENCOUNTER — Telehealth: Payer: Self-pay | Admitting: Neurology

## 2018-09-06 NOTE — Telephone Encounter (Signed)
I spoke to the patient her images are scheduled at [facility] for 09/13/18.Marland Kitchen She informed me that she has had metal in her eye but she is going to bring the report from her eye doctor that she has gotten the metal out..   Also, she informed me that the topamax is not working for her.

## 2018-09-06 NOTE — Telephone Encounter (Signed)
Pt has called wanting to change her appointment time for 06-23.  If there is a call back today please call pt on mobile# if it is a call back tomorrow please call the home#

## 2018-09-06 NOTE — Telephone Encounter (Signed)
no to the covid-19 questions MR Brain w/wo contrast, MRA Head wo contrast and MRA Neck w/wo contrast Dr. Valentina Gu Auth: [id number] & [phone] (exp. 08/30/18 to 10/28/18) & [phone] (exp. 08/31/18 to 10/29/18) patient is scheduled at [facility] for 09/13/18.

## 2018-09-06 NOTE — Telephone Encounter (Signed)
Increase Topamax to 50 mg twice daily and see if it works better

## 2018-09-07 NOTE — Telephone Encounter (Signed)
Tried to call pt back to discuss her topamax and Dr. Leonie Man recommendations. Pts vm was full unable to leave message.

## 2018-09-07 NOTE — Telephone Encounter (Signed)
I called her home and spoke to [facility] and asked him to tell her to give me a call back.

## 2018-09-08 NOTE — Telephone Encounter (Signed)
Unable to reach pt to discuss Topamax her vm is full and message cannot be left.

## 2018-09-12 NOTE — Telephone Encounter (Signed)
I have tried to call patient about her topamax. Unable to reach pt vm not set up , cannot leave message.

## 2018-09-13 ENCOUNTER — Other Ambulatory Visit: Payer: Self-pay

## 2018-09-13 ENCOUNTER — Ambulatory Visit: Payer: BC Managed Care – PPO

## 2018-09-13 DIAGNOSIS — G459 Transient cerebral ischemic attack, unspecified: Secondary | ICD-10-CM | POA: Diagnosis not present

## 2018-09-13 MED ORDER — GADOBENATE DIMEGLUMINE 529 MG/ML IV SOLN
13.0000 mL | Freq: Once | INTRAVENOUS | Status: AC | PRN
  Administered 2018-09-13: 13 mL via INTRAVENOUS

## 2018-09-20 ENCOUNTER — Telehealth: Payer: Self-pay | Admitting: Neurology

## 2018-09-20 NOTE — Telephone Encounter (Signed)
The patient had the MRI Brain w/wo contrast on 09/13/18 at [facility] but some how didn't have the two MRA's. I tried to contact the patient to schedule her two MRA's but her phone number the voicemail box has not been set up.

## 2018-09-21 ENCOUNTER — Telehealth: Payer: Self-pay | Admitting: Neurology

## 2018-09-21 NOTE — Telephone Encounter (Signed)
I called pts husband [name] that pts MRI was unremarkable no worrisome findings. He verbalized understanding.

## 2018-09-21 NOTE — Telephone Encounter (Signed)
I called pts husband [name] that two phone calls were made to pt in June about topamax not working. I stated we were unable to get in contact with her due to vm not set up. I stated Dr.Sethi recommend pt take 2 pills in the am and 2 pills in the pm. He will let his wife know and verbalized understanding.

## 2018-09-21 NOTE — Telephone Encounter (Signed)
Notes recorded by Garvin Fila, MD on 09/16/2018 at 11:58 AM EDT  Kindly inform patient that MRI scan of brain was unremarkable. No worrisome finding

## 2018-09-21 NOTE — Telephone Encounter (Signed)
Pt husband is asking for a call with the results of the MRI

## 2018-10-03 ENCOUNTER — Telehealth: Payer: Self-pay | Admitting: *Deleted

## 2018-10-03 NOTE — Telephone Encounter (Signed)
Attempted to reach the patient unsuccessfully. Unable to leave a message.

## 2018-10-04 ENCOUNTER — Other Ambulatory Visit: Payer: Self-pay

## 2018-10-04 ENCOUNTER — Ambulatory Visit (INDEPENDENT_AMBULATORY_CARE_PROVIDER_SITE_OTHER): Payer: Self-pay | Admitting: Cardiovascular Disease

## 2018-10-04 VITALS — BP 97/60 | HR 78 | Temp 97.3°F | Ht 60.0 in | Wt 140.6 lb

## 2018-10-04 DIAGNOSIS — R519 Headache, unspecified: Secondary | ICD-10-CM

## 2018-10-04 DIAGNOSIS — G459 Transient cerebral ischemic attack, unspecified: Secondary | ICD-10-CM

## 2018-10-04 DIAGNOSIS — I150 Renovascular hypertension: Secondary | ICD-10-CM

## 2018-10-04 DIAGNOSIS — R51 Headache: Secondary | ICD-10-CM

## 2018-10-04 DIAGNOSIS — E785 Hyperlipidemia, unspecified: Secondary | ICD-10-CM

## 2018-10-04 DIAGNOSIS — R55 Syncope and collapse: Secondary | ICD-10-CM

## 2018-10-04 NOTE — Progress Notes (Signed)
Patient ID: Sheri Perkins, female   DOB: 1960-08-10, 58 y.o.   MRN: [id number]    Cardiology Office Note    Date:  10/04/2018   ID:  Sheri Perkins, DOB 06-Dec-1960, MRN [id number]  PCP:  Enid Skeens., MD  Cardiologist:   Sanda Klein, MD   No chief complaint on file.   History of Present Illness:  Sheri Perkins is a 58 y.o. female with a history of a 60-99% right renal artery stenosis, presumed secondary to fibromuscular dysplasia, neurally mediated syncope, previous smoking and mild hyperlipidemia.  She has had a hard time adjusting to mask use at her workplace.  The masks are required by her employer doing the coronavirus pandemic.  She works in a [employer] where there is a lot of volatile solvents use.  She states that the masks become laden with the solvent, leading to severe headaches, nausea, vomiting and shortness of breath.  Several times he has had to leave the workplace since she felt so ill.  She's tried different materials and types of masks, without improvement.  Over the weekend she does not have any of the symptoms.  She denies chest pain or shortness of breath at rest or with activity otherwise.  She does not have edema, orthopnea or PND.  She has not had any new focal neurological events since the episode of numbness of the left side of her face, left upper extremity and left lower extremity with left hand clumsiness, that occurred in November.  Her cholesterol values are greatly improved on statin therapy with an LDL of 61 just last month.  Her blood pressure is very well controlled and today is actually on the low side.  She has not had any symptoms of dizziness or syncope.  She denies palpitations.  She had a transient ischemic attack type event consistent with right hemispheric parietal distribution about 6 months ago.  Carotid ultrasonography did not show any evidence of obstruction.  She is now taking clopidogrel and aspirin.  She underwent a MRI  of the brain on June 23 because of the headaches.  The study was normal.  She is seeing Dr. Leonie Man in the neurology clinic, he has started treatment with Topamax.  She presents after a complaints of wheezing.  The episodes began with numbness on the left side of her face consult for first numbness over left hand and left upper extremity and then spread to the left lower extremity as well.  She had some clumsiness with her left hand that she could not feel what she was preparing, but no real weakness.  She did not feel leg give out.  She did not look in the mirror to see if there was any asymmetry.  She did not have any difficulty speaking.  The entire episode lasted for approximately 10 minutes and has not recurred.  The patient specifically denies any chest pain at rest exertion, dyspnea at rest or with exertion, orthopnea, paroxysmal nocturnal dyspnea, syncope, palpitations, focal neurological deficits, intermittent claudication, lower extremity edema, unexplained weight gain, cough, hemoptysis or wheezing.  She has an aortic ejection murmur.  Echo on March 2019 showed aortic valve sclerosis without stenosis.     Past Medical History:  Diagnosis Date  . Acute bronchiolitis   . Allergy   . Arthritis    fingers , back, neck   . Hyperlipidemia   . Renal artery stenosis (HCC) 04/03/2011   right 1-59% proximal reduction,left main 1-59% reduction,  . Renovascular  hypertension   . TIA (transient ischemic attack)   . Tobacco abuse     Past Surgical History:  Procedure Laterality Date  . arm surgery Bilateral   . bilateral carpal tunnel surgery    . CARPAL TUNNEL RELEASE Bilateral   . COLONOSCOPY    . FL INJ LEFT KNEE CT ARTHROGRAM ([facility] HX)    . HAND SURGERY    . KNEE SURGERY    . POLYPECTOMY    . SHOULDER SURGERY Left   . surgery to right thumb    . ulnar nerve relocation bilaterally    . US ECHOCARDIOGRAPHY  06/16/2010   normal    Outpatient Medications Prior to Visit  Medication  Sig Dispense Refill  . atorvastatin (LIPITOR) 20 MG tablet Take 1 tablet (20 mg total) by mouth daily. 90 tablet 3  . Cholecalciferol 1.25 MG (50000 UT) TABS Take by mouth.    . losartan (COZAAR) 50 MG tablet Take 1/2 (one-half) tablet by mouth twice daily 30 tablet 6  . topiramate (TOPAMAX) 25 MG tablet Take 1 tablet (25 mg total) by mouth 2 (two) times daily. 60 tablet 2   Facility-Administered Medications Prior to Visit  Medication Dose Route Frequency Provider Last Rate Last Dose  . clopidogrel (PLAVIX) tablet 75 mg  75 mg Oral Daily Garvin Fila, MD         Allergies:   Macrobid [nitrofurantoin macrocrystal], Latex, and Sulfa antibiotics   Social History   Socioeconomic History  . Marital status: Married    Spouse name: Not on file  . Number of children: Not on file  . Years of education: Not on file  . Highest education level: Not on file  Occupational History  . Not on file  Social Needs  . Financial resource strain: Not on file  . Food insecurity    Worry: Not on file    Inability: Not on file  . Transportation needs    Medical: Not on file    Non-medical: Not on file  Tobacco Use  . Smoking status: Former Smoker    Packs/day: 0.12    Types: Cigarettes    Quit date: 04/03/2017    Years since quitting: 1.5  . Smokeless tobacco: Never Used  Substance and Sexual Activity  . Alcohol use: No    Alcohol/week: 0.0 standard drinks  . Drug use: No  . Sexual activity: Not on file  Lifestyle  . Physical activity    Days per week: Not on file    Minutes per session: Not on file  . Stress: Not on file  Relationships  . Social Herbalist on phone: Not on file    Gets together: Not on file    Attends religious service: Not on file    Active member of club or organization: Not on file    Attends meetings of clubs or organizations: Not on file    Relationship status: Not on file  Other Topics Concern  . Not on file  Social History Narrative  . Not on file      Family History:  The patient's mother has coronary artery disease status post stent, obstructive sleep apnea, hyperlipidemia  ROS:   Please see the history of present illness.    ROS All other systems are reviewed and are negative   PHYSICAL EXAM:   VS:  Pulse 78   Temp (!) 97.3 F (36.3 C) (Temporal)   Ht 5' (1.524 m)   Wt 140  lb 9.6 oz (63.8 kg)   SpO2 100%   BMI 27.46 kg/m     General: Alert, oriented x3, no distress, overweight Head: no evidence of trauma, PERRL, EOMI, no exophtalmos or lid lag, no myxedema, no xanthelasma; normal ears, nose and oropharynx Neck: normal jugular venous pulsations and no hepatojugular reflux; brisk carotid pulses without delay and no carotid bruits Chest: clear to auscultation, no signs of consolidation by percussion or palpation, normal fremitus, symmetrical and full respiratory excursions Cardiovascular: normal position and quality of the apical impulse, regular rhythm, normal first and second heart sounds, early peaking 1-2/6 aortic ejection murmur with limited radiation no diastolic murmurs, rubs or gallops Abdomen: no tenderness or distention, no masses by palpation, no abnormal pulsatility or arterial bruits, normal bowel sounds, no hepatosplenomegaly Extremities: no clubbing, cyanosis or edema; 2+ radial, ulnar and brachial pulses bilaterally; 2+ right femoral, posterior tibial and dorsalis pedis pulses; 2+ left femoral, posterior tibial and dorsalis pedis pulses; no subclavian or femoral bruits Neurological: grossly nonfocal Psych: Normal mood and affect   Wt Readings from Last 3 Encounters:  10/04/18 140 lb 9.6 oz (63.8 kg)  08/25/18 144 lb 9.6 oz (65.6 kg)  03/25/18 145 lb 6.4 oz (66 kg)      Studies/Labs Reviewed:   EKG:  EKG is ordered today.  The ekg ordered today shows normal sinus rhythm, QTC 436 ms  Recent Labs: 06/24/2016 glucose 94, cholesterol 152, triglycerides 88, HDL 43, LDL 91, creatinine 0.68, TSH 1.62  Lipid  Panel     Component Value Date/Time   CHOL 126 08/25/2018 1345   TRIG 90 08/25/2018 1345   HDL 47 08/25/2018 1345   CHOLHDL 2.7 08/25/2018 1345   CHOLHDL 4.1 05/10/2015 0910   VLDL 15 05/10/2015 0910   LDLCALC 61 08/25/2018 1345   BMET    Component Value Date/Time   NA 144 01/28/2018 0946   K 4.1 01/28/2018 0946   CL 104 01/28/2018 0946   CO2 25 01/28/2018 0946   GLUCOSE 86 01/28/2018 0946   GLUCOSE 102 (H) 04/01/2017 0841   BUN 11 01/28/2018 0946   CREATININE 0.67 01/28/2018 0946   CREATININE 0.64 05/10/2015 0910   CALCIUM 9.2 01/28/2018 0946   GFRNONAA 97 01/28/2018 0946   GFRAA 112 01/28/2018 0946     ASSESSMENT:    No diagnosis found.   PLAN:  In order of problems listed above:  1. TIA: No recurrence.  She is taking aspirin and clopidogrel 2. Renal artery stenosis/renovascular hypertension: Blood pressure is well controlled and she has normal renal function on losartan monotherapy. 3. HLP: LDL at target less than 70.  Continue statin. 4. Neurally mediated syncope: No recurrence.  Reminded her to stay well-hydrated, avoid prolonged standing without moving, heat or any other identified triggers.  5. Headaches: These are debilitating and associated nausea and vomiting often prevent her from working.  Undergoing neurology work-up.  No cardiovascular causes for this is identified.  Wearing the mask at work seems to be the precipitating factor.    Medication Adjustments/Labs and Tests Ordered: Current medicines are reviewed at length with the patient today.  Concerns regarding medicines are outlined above.  Medication changes, Labs and Tests ordered today are listed in the Patient Instructions below. There are no Patient Instructions on file for this visit.     Signed, Sanda Klein, MD  10/04/2018 11:50 AM    [facility] [address], [place], [place]  [address] Phone: [phone]; Fax: [phone]  [phone]    

## 2018-10-04 NOTE — Patient Instructions (Signed)

## 2018-10-05 ENCOUNTER — Telehealth: Payer: Self-pay | Admitting: *Deleted

## 2018-10-05 NOTE — Telephone Encounter (Signed)
Message has been left with the patient that her paperwork is ready to be picked up. They have been left up front for her.

## 2018-10-06 ENCOUNTER — Encounter: Payer: Self-pay | Admitting: Cardiovascular Disease

## 2018-10-06 DIAGNOSIS — R519 Headache, unspecified: Secondary | ICD-10-CM | POA: Insufficient documentation

## 2018-10-13 NOTE — Telephone Encounter (Signed)
Follow Up  Patient needs her FMLA paperwork revised. She states that the duration needs to be updated. It should be 2 days per episode and 2 times a month vs 2 days per cycle and 1 time a month. Please give patient a call back.   Paperwork can be faxed to the number on the [facility] paperwork previously filled out. Changes that are made have to be initialed and dated.

## 2018-10-24 NOTE — Telephone Encounter (Signed)
Left a message for the patient to call back.  

## 2018-10-25 NOTE — Telephone Encounter (Signed)
Patient is returning call.  °

## 2018-10-27 NOTE — Telephone Encounter (Signed)
Attempted to reach the patient. Was unable to leave a message 

## 2018-10-31 NOTE — Telephone Encounter (Signed)
Spoke with the patient. She was advised that the office would need a new copy of the FMLA papers to fill out. She has verbalized her understanding.

## 2018-11-03 ENCOUNTER — Encounter: Payer: Self-pay | Admitting: Neurology

## 2018-11-03 ENCOUNTER — Other Ambulatory Visit: Payer: Self-pay

## 2018-11-03 ENCOUNTER — Ambulatory Visit: Payer: BC Managed Care – PPO | Admitting: Neurology

## 2018-11-03 VITALS — BP 159/81 | HR 70 | Temp 98.2°F | Wt 144.0 lb

## 2018-11-03 DIAGNOSIS — G44209 Tension-type headache, unspecified, not intractable: Secondary | ICD-10-CM | POA: Diagnosis not present

## 2018-11-03 MED ORDER — TOPIRAMATE 25 MG PO TABS: 50.0000 mg | ORAL_TABLET | Freq: Two times a day (BID) | ORAL | 2 refills | Status: DC

## 2018-11-03 NOTE — Progress Notes (Signed)
[facility] [address] [place] [address] [phone]       OFFICE FOLLOW UP VISIT NOTE  Ms. Elenor Legato Cabral Date of Birth:  1960-06-29 Medical Record Number:  [id number]   Referring MD:  Cecille Amsterdam  Reason for Referral:  TIA  HPI: Initial visit 08/25/2018:Ms Sheri Perkins is a 58 year old pleasant Caucasian lady seen today for initial office consultation visit for TIA and chronic headaches.  History is obtained from the patient and review of referral notes.  She states that sometime in January or February she had a brief episode of sudden onset of left face arm and leg tingling and numbness.  This lasted about 30 minutes.  She had a mild headache which was not disabling.  She did not have any significant weakness and was able to walk fine at that time.  She did not have any slurred speech.  She checked her blood pressure at that time it was fine.  She did not seek medical help right away.  She mentioned this several weeks later with a primary care visit.  She did see a cardiologist in March who ordered carotid ultrasound which I have personally reviewed showed no significant extracranial stenosis as well as 30-day heart monitor which was negative for significant arrhythmias.  Patient has not had any recurrent TIA or stroke symptoms.  She denies slurred speech, extremity weakness, gait or balance problems.  Review of her electronic medical records show that she had an MRI scan of the brain and MRA of the brain which was normal in 2006 at that time she had episode of dizziness lightheadedness and passing out.  The patient also complains of her chronic daily headaches which she is had for 2 years.  She describes a headache present mostly bifrontally dull in 6/10 in severity occasionally throbbing.  The headaches are noticed during the day after going to work every day.  She has been taking now 4 tablets of Tylenol every day prior to going to work in the past she was taking 4  tablets of ibuprofen.  She has not tried any medication for headache prophylaxis.  She does complain of tightness of the muscles of her neck.  She does not do any regular neck stretching exercises.  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  She has no prior history of strokes or TIAs.  She does smoke 1 pack/day. Update 11/03/2018 : She returns for follow-up after last visit 2 months ago.  She states her headaches are improved but they are still occurring 3 times a week or so.  They are much milder.  She takes Tylenol which helps.  She is tolerating Topamax 25 mg twice daily quite well without any side effects.  She is on aspirin 81 mg daily which is tolerating well.  Her blood pressure usually is better controlled today it is elevated in the office at 189/81.  She has no new complaints.  She did undergo MRI scan of the brain on 09/15/2018 which was unremarkable.  She admits to being under significant stress since she is bringing up 3 of her nieces children ROS:   14 system review of systems is positive for headache, neck pain, muscle tightness,   and all other systems negative  PMH:  Past Medical History:  Diagnosis Date   Acute bronchiolitis  Allergy    Arthritis    fingers , back, neck    Hyperlipidemia    Renal artery stenosis (HCC) 04/03/2011   right 1-59% proximal reduction,left main 1-59% reduction,   Renovascular hypertension    TIA (transient ischemic attack)    Tobacco abuse     Social History:  Social History   Socioeconomic History   Marital status: Married    Spouse name: Not on file   Number of children: Not on file   Years of education: Not on file   Highest education level: Not on file  Occupational History   Not on file  Social Needs   Financial resource strain: Not on file   Food insecurity    Worry: Not on file     Inability: Not on file   Transportation needs    Medical: Not on file    Non-medical: Not on file  Tobacco Use   Smoking status: Former Smoker    Packs/day: 0.12    Types: Cigarettes    Quit date: 04/03/2017    Years since quitting: 1.5   Smokeless tobacco: Never Used  Substance and Sexual Activity   Alcohol use: No    Alcohol/week: 0.0 standard drinks   Drug use: No   Sexual activity: Not on file  Lifestyle   Physical activity    Days per week: Not on file    Minutes per session: Not on file   Stress: Not on file  Relationships   Social connections    Talks on phone: Not on file    Gets together: Not on file    Attends religious service: Not on file    Active member of club or organization: Not on file    Attends meetings of clubs or organizations: Not on file    Relationship status: Not on file   Intimate partner violence    Fear of current or ex partner: Not on file    Emotionally abused: Not on file    Physically abused: Not on file    Forced sexual activity: Not on file  Other Topics Concern   Not on file  Social History Narrative   Not on file    Medications:   Current Outpatient Medications on File Prior to Visit  Medication Sig Dispense Refill   atorvastatin (LIPITOR) 20 MG tablet Take 1 tablet (20 mg total) by mouth daily. 90 tablet 3   Cholecalciferol 1.25 MG (50000 UT) TABS Take by mouth.     losartan (COZAAR) 50 MG tablet Take 1/2 (one-half) tablet by mouth twice daily 30 tablet 6   Current Facility-Administered Medications on File Prior to Visit  Medication Dose Route Frequency Provider Last Rate Last Dose   clopidogrel (PLAVIX) tablet 75 mg  75 mg Oral Daily Garvin Fila, MD        Allergies:   Allergies  Allergen Reactions   Macrobid [Nitrofurantoin Macrocrystal] Other (See Comments)    "burned lungs"   Latex Itching        Sulfa Antibiotics Hives and Rash    Physical Exam General: well developed, well nourished  middle-aged Caucasian lady, seated, in no evident distress Head: head normocephalic and atraumatic.   Neck: supple with no carotid or supraclavicular bruits Cardiovascular: regular rate and rhythm, no murmurs Musculoskeletal: no deformity.  Mild tightness of posterior cervical muscles with slight restriction of lateral movements. Skin:  no rash/petichiae Vascular:  Normal pulses all extremities  Neurologic Exam Mental Status: Awake and fully alert.  Oriented to place and time. Recent and remote memory intact. Attention span, concentration and fund of knowledge appropriate. Mood and affect appropriate.  Cranial Nerves: Fundoscopic exam reveals sharp disc margins. Pupils equal, briskly reactive to light. Extraocular movements full without nystagmus. Visual fields full to confrontation. Hearing intact. Facial sensation intact. Face, tongue, palate moves normally and symmetrically.  Motor: Normal bulk and tone. Normal strength in all tested extremity muscles. Sensory.: intact to touch , pinprick , position and vibratory sensation.  Coordination: Rapid alternating movements normal in all extremities. Finger-to-nose and heel-to-shin performed accurately bilaterally. Gait and Station: Arises from chair without difficulty. Stance is normal. Gait demonstrates normal stride length and balance . Able to heel, toe and tandem walk without difficulty.  Reflexes: 1+ and symmetric. Toes downgoing.   NIHSS  0 Modified Rankin  1   ASSESSMENT: 58 year old patient with transient episode of left face arm and leg paresthesias in January February 2020 likely right hemispheric subcortical TIA from small vessel disease.  Vascular risk factors of smoking , hypertension and hyperlipidemia.  She also has chronic daily headaches which likely represent transformed muscle tension headaches with analgesic rebound    PLAN: I had a long d/w patient about her  TIA, tension headaches and analgesic rebound, risk for recurrent  stroke/TIAs, personally independently reviewed imaging studies and stroke evaluation results and answered questions. Continue aspirin 81 mgdaily for secondary stroke prevention and maintain strict control of hypertension with blood pressure goal below 130/90, diabetes with hemoglobin A1c goal below 6.5% and lipids with LDL cholesterol goal below 70 mg/dL. I also advised the patient to eat a healthy diet with plenty of whole grains, cereals, fruits and vegetables, exercise regularly and maintain ideal body weight.  She was advised to do regular neck stretching exercises and participate in stress relaxation activities like regular exercise, swimming, meditation and yoga. She was  Advised to dodaily neck stretching exercises as well. She was advised to discontinue daily Tylenol to reduce analgesic rebound headache and increaseTopamax dose to 50 mg twice daily for headache prevention.She will return for f/u in 3 months with my NP Janett Billow or call earlier if needed.Greater than 50% time during this 25-minute blood sugars visit was spent on counseling and coordination of care about her possible TIA as well as tension headache and answering questions.  followup in the future with me in 2 months or call earlier if necessary Antony Contras, MD Note: This document was prepared with digital dictation and possible smart phrase technology. Any transcriptional errors that result from this process are unintentional.

## 2018-11-03 NOTE — Patient Instructions (Signed)
I had a long d/w patient about her  TIA, tension headaches and analgesic rebound, risk for recurrent stroke/TIAs, personally independently reviewed imaging studies and stroke evaluation results and answered questions. Continue aspirin 81 mgdaily for secondary stroke prevention and maintain strict control of hypertension with blood pressure goal below 130/90, diabetes with hemoglobin A1c goal below 6.5% and lipids with LDL cholesterol goal below 70 mg/dL. I also advised the patient to eat a healthy diet with plenty of whole grains, cereals, fruits and vegetables, exercise regularly and maintain ideal body weight.  She was advised to do regular neck stretching exercises and participate in stress relaxation activities like regular exercise, swimming, meditation and yoga. She was  Advised to dodaily neck stretching exercises as well. She was advised to discontinue daily Tylenol to reduce analgesic rebound headache and increaseTopamax dose to 50 mg twice daily for headache prevention.She will return for f/u in 3 months with my NP Janett Billow or call earlier if needed.  Neck Exercises Ask your health care provider which exercises are safe for you. Do exercises exactly as told by your health care provider and adjust them as directed. It is normal to feel mild stretching, pulling, tightness, or discomfort as you do these exercises. Stop right away if you feel sudden pain or your pain gets worse. Do not begin these exercises until told by your health care provider. Neck exercises can be important for many reasons. They can improve strength and maintain flexibility in your neck, which will help your upper back and prevent neck pain. Stretching exercises Rotation neck stretching  1. Sit in a chair or stand up. 2. Place your feet flat on the floor, shoulder width apart. 3. Slowly turn your head (rotate) to the right until a slight stretch is felt. Turn it all the way to the right so you can look over your right shoulder.  Do not tilt or tip your head. 4. Hold this position for 10-30 seconds. 5. Slowly turn your head (rotate) to the left until a slight stretch is felt. Turn it all the way to the left so you can look over your left shoulder. Do not tilt or tip your head. 6. Hold this position for 10-30 seconds. Repeat __________ times. Complete this exercise __________ times a day. Neck retraction 1. Sit in a sturdy chair or stand up. 2. Look straight ahead. Do not bend your neck. 3. Use your fingers to push your chin backward (retraction). Do not bend your neck for this movement. Continue to face straight ahead. If you are doing the exercise properly, you will feel a slight sensation in your throat and a stretch at the back of your neck. 4. Hold the stretch for 1-2 seconds. Repeat __________ times. Complete this exercise __________ times a day. Strengthening exercises Neck press 1. Lie on your back on a firm bed or on the floor with a pillow under your head. 2. Use your neck muscles to push your head down on the pillow and straighten your spine. 3. Hold the position as well as you can. Keep your head facing up (in a neutral position) and your chin tucked. 4. Slowly count to 5 while holding this position. Repeat __________ times. Complete this exercise __________ times a day. Isometrics These are exercises in which you strengthen the muscles in your neck while keeping your neck still (isometrics). 1. Sit in a supportive chair and place your hand on your forehead. 2. Keep your head and face facing straight ahead. Do not flex  or extend your neck while doing isometrics. 3. Push forward with your head and neck while pushing back with your hand. Hold for 10 seconds. 4. Do the sequence again, this time putting your hand against the back of your head. Use your head and neck to push backward against the hand pressure. 5. Finally, do the same exercise on either side of your head, pushing sideways against the pressure of  your hand. Repeat __________ times. Complete this exercise __________ times a day. Prone head lifts 1. Lie face-down (prone position), resting on your elbows so that your chest and upper back are raised. 2. Start with your head facing downward, near your chest. Position your chin either on or near your chest. 3. Slowly lift your head upward. Lift until you are looking straight ahead. Then continue lifting your head as far back as you can comfortably stretch. 4. Hold your head up for 5 seconds. Then slowly lower it to your starting position. Repeat __________ times. Complete this exercise __________ times a day. Supine head lifts 1. Lie on your back (supine position), bending your knees to point to the ceiling and keeping your feet flat on the floor. 2. Lift your head slowly off the floor, raising your chin toward your chest. 3. Hold for 5 seconds. Repeat __________ times. Complete this exercise __________ times a day. Scapular retraction 1. Stand with your arms at your sides. Look straight ahead. 2. Slowly pull both shoulders (scapulae) backward and downward (retraction) until you feel a stretch between your shoulder blades in your upper back. 3. Hold for 10-30 seconds. 4. Relax and repeat. Repeat __________ times. Complete this exercise __________ times a day. Contact a health care provider if:  Your neck pain or discomfort gets much worse when you do an exercise.  Your neck pain or discomfort does not improve within 2 hours after you exercise. If you have any of these problems, stop exercising right away. Do not do the exercises again unless your health care provider says that you can. Get help right away if:  You develop sudden, severe neck pain. If this happens, stop exercising right away. Do not do the exercises again unless your health care provider says that you can. This information is not intended to replace advice given to you by your health care provider. Make sure you discuss  any questions you have with your health care provider. Document Released: 02/18/2015 Document Revised: 01/05/2018 Document Reviewed: 01/05/2018 Elsevier Patient Education  2020 [employer].

## 2018-12-02 ENCOUNTER — Ambulatory Visit: Payer: BLUE CROSS/BLUE SHIELD | Admitting: Cardiovascular Disease

## 2019-02-09 ENCOUNTER — Ambulatory Visit: Payer: Self-pay | Admitting: Neurology

## 2019-02-17 DIAGNOSIS — M25511 Pain in right shoulder: Secondary | ICD-10-CM | POA: Diagnosis not present

## 2019-02-21 ENCOUNTER — Telehealth: Payer: Self-pay | Admitting: Neurology

## 2019-02-21 NOTE — Telephone Encounter (Signed)
In patients chart her PCP did FMLA for her in 10/2018.

## 2019-02-21 NOTE — Telephone Encounter (Signed)
Patient called in regards to her FMLA paperwork that has to be filled out again  due to the mask that she has to wear at work has a strong smell and causes headaches to the point she vomits. Patient states now that it has been 3 months she needs updated FMLA paperwork. Please follow up.

## 2019-02-21 NOTE — Telephone Encounter (Signed)
revised 

## 2019-02-21 NOTE — Telephone Encounter (Addendum)
I called pt that we dont have any documentation of doing her FMLA for her job. PT stated our office did FMLA at last visit. I stated we check with medical records, and there is no form on file. I recommend pt fax a copy of the last one to see who completed it. Pt stated it was not her PCP. I  advise pt Dr.SEthi will have to be approve if he is willing to do it. PT verbalized understanding.

## 2019-02-22 NOTE — Telephone Encounter (Signed)
Advise patient to see her primary MD for FMLA as he/she did it in past

## 2019-02-24 ENCOUNTER — Telehealth: Payer: Self-pay | Admitting: *Deleted

## 2019-02-24 ENCOUNTER — Telehealth: Payer: Self-pay | Admitting: Cardiovascular Disease

## 2019-02-24 NOTE — Telephone Encounter (Signed)
Second attempt to reach the patient unsuccessfully. Unable to leave a message due to voicemail not set up.

## 2019-02-24 NOTE — Telephone Encounter (Signed)
Attempted to reach the patient for her virtual appointment this morning with Dr. Sallyanne Kuster. Her cell phone goes straight to voicemail and there was no answer on the other number.  Will try back again.

## 2019-02-28 NOTE — Telephone Encounter (Signed)
I called pt that Dr.SEthi recommend she call her PCP for FMLA.PTs PCP has done FMLA for her in the last 6 months per chart. I stated Dr.SEThi has never completed  FMLA for her as a pt. PT verbalized understanding.

## 2019-03-06 ENCOUNTER — Other Ambulatory Visit: Payer: Self-pay | Admitting: Cardiovascular Disease

## 2019-03-09 ENCOUNTER — Ambulatory Visit: Payer: BC Managed Care – PPO | Admitting: Physician Assistant

## 2019-03-09 ENCOUNTER — Encounter: Payer: Self-pay | Admitting: Physician Assistant

## 2019-03-09 ENCOUNTER — Other Ambulatory Visit: Payer: Self-pay

## 2019-03-09 VITALS — BP 152/82 | HR 74 | Ht 60.0 in | Wt 144.0 lb

## 2019-03-09 DIAGNOSIS — E785 Hyperlipidemia, unspecified: Secondary | ICD-10-CM

## 2019-03-09 DIAGNOSIS — Z720 Tobacco use: Secondary | ICD-10-CM

## 2019-03-09 DIAGNOSIS — R55 Syncope and collapse: Secondary | ICD-10-CM

## 2019-03-09 DIAGNOSIS — I701 Atherosclerosis of renal artery: Secondary | ICD-10-CM | POA: Diagnosis not present

## 2019-03-09 DIAGNOSIS — R519 Headache, unspecified: Secondary | ICD-10-CM | POA: Diagnosis not present

## 2019-03-09 NOTE — Patient Instructions (Signed)
Medication Instructions:  Your physician recommends that you continue on your current medications as directed. Please refer to the Current Medication list given to you today.  *If you need a refill on your cardiac medications before your next appointment, please call your pharmacy*  Lab Work: NONE ordered at this time of appointment   If you have labs (blood work) drawn today and your tests are completely normal, you will receive your results only by: Marland Kitchen MyChart Message (if you have MyChart) OR . A paper copy in the mail If you have any lab test that is abnormal or we need to change your treatment, we will call you to review the results.  Testing/Procedures: NONE ordered at this time of appointment   Follow-Up: At [facility], you and your health needs are our priority.  As part of our continuing mission to provide you with exceptional heart care, we have created designated Provider Care Teams.  These Care Teams include your primary Cardiologist (physician) and Advanced Practice Providers (APPs -  Physician Assistants and Nurse Practitioners) who all work together to provide you with the care you need, when you need it.  Your next appointment:   6 month(s)  The format for your next appointment:   In Person  Provider:   Sanda Klein, MD  Other Instructions  Monitor blood pressure at home at least [place]

## 2019-03-09 NOTE — Progress Notes (Signed)
Cardiology Office Note:    Date:  03/11/2019   ID:  Sheri Perkins, DOB 04-11-1960, MRN [id number]  PCP:  Enid Skeens., MD  Cardiologist:  Sanda Klein, MD  Electrophysiologist:  None   Referring MD: Enid Skeens., MD   Chief Complaint  Patient presents with  . Follow-up    seen for Dr. Sallyanne Kuster    History of Present Illness:    Sheri Perkins is a 58 y.o. female with a hx of HLD, nonobstructive renal artery stenosis secondary to fibromuscular dysplasia, neurally mediated syncope, history of TIA, hypertension and tobacco abuse.  Last echocardiogram in March 2019 showed normal EF, aortic valve sclerosis without stenosis.  She had a TIA symptom in late 2019, carotid ultrasound did not show significant obstruction.  Event monitor was negative for atrial fibrillation.  She was placed on aspirin and Plavix.  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  This has caused significant headache.  MRI of the brain obtained on 09/13/2018 was negative.  Patient presents today for cardiology office visit.  She denies any chest pain or shortness of breath.  She continues to have issue with headache and frequent episodes of nausea.  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  However she must abide by the strict mask policy.  She is asking me to fill out her FMLA form.   Past Medical History:  Diagnosis Date  . Acute bronchiolitis   . Allergy   . Arthritis    fingers , back, neck   . Hyperlipidemia   . Renal artery stenosis (H[facility]) 04/03/2011   right 1-59% proximal reduction,left main 1-59% reduction,  . Renovascular hypertension   . TIA (transient ischemic attack)   . Tobacco abuse     Past Surgical History:  Procedure Laterality Date  . arm surgery Bilateral   . bilateral carpal tunnel surgery    . CARPAL TUNNEL RELEASE Bilateral   . COLONOSCOPY      . FL INJ LEFT KNEE CT ARTHROGRAM ([facility] HX)    . HAND SURGERY    . KNEE SURGERY    . POLYPECTOMY    . SHOULDER SURGERY Left   . surgery to right thumb    . ulnar nerve relocation bilaterally    . US ECHOCARDIOGRAPHY  06/16/2010   normal    Current Medications: Current Meds  Medication Sig  . aspirin EC 81 MG tablet Take 81 mg by mouth daily.  Marland Kitchen atorvastatin (LIPITOR) 20 MG tablet Take 1 tablet by mouth once daily  . Cholecalciferol 1.25 MG (50000 UT) TABS Take by mouth.  . cyclobenzaprine (FLEXERIL) 5 MG tablet Take 5 mg by mouth at bedtime.  Marland Kitchen losartan (COZAAR) 50 MG tablet Take 1/2 (one-half) tablet by mouth twice daily  . topiramate (TOPAMAX) 25 MG tablet Take 2 tablets (50 mg total) by mouth 2 (two) times daily.   Current Facility-Administered Medications for the 03/09/19 encounter (Office Visit) with Almyra Deforest, PA  Medication  . clopidogrel (PLAVIX) tablet 75 mg     Allergies:   Macrobid [nitrofurantoin macrocrystal], Latex, and Sulfa antibiotics   Social History   Socioeconomic History  . Marital status: Married    Spouse name: Not on file  . Number of children: Not on file  . Years of education: Not on file  . Highest education level: Not on file  Occupational History  . Not on file  Tobacco Use  . Smoking status: Former Smoker    Packs/day: 0.12    Types: Cigarettes    Quit date: 04/03/2017    Years since quitting: 1.9  . Smokeless tobacco: Never Used  Substance and Sexual Activity  . Alcohol use: No    Alcohol/week: 0.0 standard drinks  . Drug use: No  . Sexual activity: Not on file  Other Topics Concern  . Not on file  Social History Narrative  . Not on file   Social Determinants of Health   Financial Resource Strain:   . Difficulty of Paying Living Expenses: Not on file  Food Insecurity:   . Worried About Charity fundraiser in the Last Year: Not on file  . Ran Out of Food in the Last Year: Not on file  Transportation Needs:   . Lack of  Transportation (Medical): Not on file  . Lack of Transportation (Non-Medical): Not on file  Physical Activity:   . Days of Exercise per Week: Not on file  . Minutes of Exercise per Session: Not on file  Stress:   . Feeling of Stress : Not on file  Social Connections:   . Frequency of Communication with Friends and Family: Not on file  . Frequency of Social Gatherings with Friends and Family: Not on file  . Attends Religious Services: Not on file  . Active Member of Clubs or Organizations: Not on file  . Attends Archivist Meetings: Not on file  . Marital Status: Not on file     Family History: The patient's family history includes Melanoma in her sister. There is no history of Colon cancer, Colon polyps, Rectal cancer, or Stomach cancer.  ROS:   Please see the history of present illness.     All other systems reviewed and are negative.  EKGs/Labs/Other Studies Reviewed:    The following studies were reviewed today:  Echo 05/28/2017 LV EF: 55% -   60% Study Conclusions  - Left ventricle: The cavity size was normal. Wall thickness was   normal. Systolic function was normal. The estimated ejection   fraction was in the range of 55% to 60%. Wall motion was normal;   there were no regional wall motion abnormalities. Left   ventricular diastolic function parameters were normal.  Impressions:  - Normal study.   EKG:  EKG is not ordered today.    Recent Labs: No results found for requested labs within last 8760 hours.  Recent Lipid Panel    Component Value Date/Time   CHOL 126 08/25/2018 1345   TRIG 90 08/25/2018 1345   HDL 47 08/25/2018 1345   CHOLHDL 2.7 08/25/2018 1345   CHOLHDL 4.1 05/10/2015 0910   VLDL 15 05/10/2015 0910   LDLCALC 61 08/25/2018 1345    Physical Exam:    VS:  BP (!) 152/82   Pulse 74   Ht 5' (1.524 m)   Wt 144 lb (65.3 kg)   SpO2 98%   BMI 28.12 kg/m     Wt Readings from Last 3 Encounters:  03/09/19 144 lb (65.3 kg)    11/03/18 144 lb (65.3 kg)  10/04/18 140 lb 9.6 oz (63.8 kg)     GEN:  Well nourished, well developed in no acute distress HEENT: Normal NECK: No JVD; No carotid bruits LYMPHATICS: No lymphadenopathy CARDIAC: RRR, no murmurs, rubs, gallops RESPIRATORY:  Clear to auscultation without rales, wheezing or rhonchi  ABDOMEN: Soft, non-tender, non-distended  MUSCULOSKELETAL:  No edema; No deformity  SKIN: Warm and dry NEUROLOGIC:  Alert and oriented x 3 PSYCHIATRIC:  Normal affect   ASSESSMENT:    1. Acute nonintractable headache, unspecified headache type   2. Hyperlipidemia, unspecified hyperlipidemia type   3. Renal artery stenosis ([place])   4. Tobacco abuse    PLAN:    In order of problems listed above:  1. Headache: This has been on recurrent issue since the start of the COVID-19 pandemic.  She works in an Estate manager/land agent.  Facemask is required as part of the state recommendation and factory regulation.  Unfortunately, due to heavy fumes from the solvent, she is having increasing severe headaches.  I recommended she be allowed to get off work early for 2 to 4 days out of the month if she is having significant headache and vomiting issue that prevents her from completing her duty.  2. Hyperlipidemia: Continue Lipitor  3. Neurally mediated syncope: No recurrence.  Keep herself hydrated.  4. renal artery stenosis: Stable on last ultrasound  5. Tobacco abuse: Tobacco cessation highly encouraged.   Medication Adjustments/Labs and Tests Ordered: Current medicines are reviewed at length with the patient today.  Concerns regarding medicines are outlined above.  No orders of the defined types were placed in this encounter.  No orders of the defined types were placed in this encounter.   Patient Instructions  Medication Instructions:  Your physician recommends that you continue on your current medications as directed. Please refer to the Current Medication list given to you  today.  *If you need a refill on your cardiac medications before your next appointment, please call your pharmacy*  Lab Work: NONE ordered at this time of appointment   If you have labs (blood work) drawn today and your tests are completely normal, you will receive your results only by: Marland Kitchen MyChart Message (if you have MyChart) OR . A paper copy in the mail If you have any lab test that is abnormal or we need to change your treatment, we will call you to review the results.  Testing/Procedures: NONE ordered at this time of appointment   Follow-Up: At [facility], you and your health needs are our priority.  As part of our continuing mission to provide you with exceptional heart care, we have created designated Provider Care Teams.  These Care Teams include your primary Cardiologist (physician) and Advanced Practice Providers (APPs -  Physician Assistants and Nurse Practitioners) who all work together to provide you with the care you need, when you need it.  Your next appointment:   6 month(s)  The format for your next appointment:   In Person  Provider:   Sanda Klein, MD  Other Instructions  Monitor blood pressure at home at least [place], Almyra Deforest, [place]  03/11/2019 11:43 PM    [place]

## 2019-03-10 ENCOUNTER — Telehealth: Payer: Self-pay | Admitting: Physician Assistant

## 2019-03-10 NOTE — Telephone Encounter (Signed)
03/10/2019 Patient walked in office to bring FMLA Form from [employer]. And signed an Release Form and drop off a 25.00 check.  I then inter-office it to [employer] to process.  cbr

## 2019-03-11 ENCOUNTER — Encounter: Payer: Self-pay | Admitting: Physician Assistant

## 2019-03-21 DIAGNOSIS — Z1231 Encounter for screening mammogram for malignant neoplasm of breast: Secondary | ICD-10-CM | POA: Diagnosis not present

## 2019-03-21 DIAGNOSIS — Z6828 Body mass index (BMI) 28.0-28.9, adult: Secondary | ICD-10-CM | POA: Diagnosis not present

## 2019-03-21 DIAGNOSIS — Z01419 Encounter for gynecological examination (general) (routine) without abnormal findings: Secondary | ICD-10-CM | POA: Diagnosis not present

## 2019-03-30 ENCOUNTER — Telehealth: Payer: Self-pay | Admitting: Neurology

## 2019-03-30 NOTE — Telephone Encounter (Signed)
I called patient today to confirm her 1/11 appointment. Patient states that she needs to cancel this appointment. Reason was not given. Patient states she will call back at a later date to reschedule.

## 2019-04-03 ENCOUNTER — Ambulatory Visit: Payer: Self-pay | Admitting: Neurology

## 2019-04-03 ENCOUNTER — Other Ambulatory Visit: Payer: Self-pay | Admitting: Cardiovascular Disease

## 2019-05-05 DIAGNOSIS — I10 Essential (primary) hypertension: Secondary | ICD-10-CM | POA: Diagnosis not present

## 2019-05-05 DIAGNOSIS — E7801 Familial hypercholesterolemia: Secondary | ICD-10-CM | POA: Diagnosis not present

## 2019-05-05 DIAGNOSIS — Z79899 Other long term (current) drug therapy: Secondary | ICD-10-CM | POA: Diagnosis not present

## 2019-05-05 DIAGNOSIS — N3001 Acute cystitis with hematuria: Secondary | ICD-10-CM | POA: Diagnosis not present

## 2019-05-12 DIAGNOSIS — M79672 Pain in left foot: Secondary | ICD-10-CM | POA: Diagnosis not present

## 2019-05-26 ENCOUNTER — Telehealth: Payer: Self-pay | Admitting: Cardiovascular Disease

## 2019-05-26 NOTE — Telephone Encounter (Signed)
New Message  Pt c/o swelling: STAT is pt has developed SOB within 24 hours  1) How much weight have you gained and in what time span? No  2) If swelling, where is the swelling located? Ankles, Legs  3) Are you currently taking a fluid pill? Yes  4) Are you currently SOB? No  5) Do you have a log of your daily weights (if so, list)? No  6) Have you gained 3 pounds in a day or 5 pounds in a week? No  7) Have you traveled recently? No

## 2019-05-26 NOTE — Telephone Encounter (Signed)
Called patient, she states that she has had some swelling in her feet and ankles- denies swelling in her hands, denies chest pain, SOB, no med changes, no diet changes, no pitting edema. She states her blood pressure has been okay, but she checked on the phone with me and it was 143/75. She does not  use compression stockings or elevating her legs. So I suggested she use compression and elevate legs, as well as getting a scale to monitor her weight as well. I advised her to call back if BP continues to increase, leg swelling gets worse, or if she gains 3lbs in a day or 5lbs in a week. Patient verbalized understanding.  Advised I would route to MD to make aware.

## 2019-07-13 DIAGNOSIS — S8002XA Contusion of left knee, initial encounter: Secondary | ICD-10-CM | POA: Diagnosis not present

## 2019-07-21 DIAGNOSIS — R7401 Elevation of levels of liver transaminase levels: Secondary | ICD-10-CM | POA: Diagnosis not present

## 2019-08-03 DIAGNOSIS — R7401 Elevation of levels of liver transaminase levels: Secondary | ICD-10-CM | POA: Diagnosis not present

## 2019-08-07 DIAGNOSIS — M25562 Pain in left knee: Secondary | ICD-10-CM | POA: Diagnosis not present

## 2019-09-28 DIAGNOSIS — Z8349 Family history of other endocrine, nutritional and metabolic diseases: Secondary | ICD-10-CM | POA: Diagnosis not present

## 2019-09-28 DIAGNOSIS — R5383 Other fatigue: Secondary | ICD-10-CM | POA: Diagnosis not present

## 2019-10-05 ENCOUNTER — Telehealth: Payer: Self-pay | Admitting: Cardiovascular Disease

## 2019-10-05 NOTE — Telephone Encounter (Signed)
Patient wanted to let Dr. Sallyanne Kuster know that she will be bringing in [facility] papers. Patient did request a callback.

## 2019-10-05 NOTE — Telephone Encounter (Signed)
Spoke with pt, she had this paperwork filled out last year and is needing it again. She is bringing them by tomorrow and is aware to ask for medical records.

## 2019-10-06 ENCOUNTER — Telehealth: Payer: Self-pay | Admitting: *Deleted

## 2019-10-06 NOTE — Telephone Encounter (Signed)
Patient dropped off FMLA paper work along with a 25.00 dollar money order. Paperwork will be forwarded via-caurier to [place].

## 2019-10-24 NOTE — Telephone Encounter (Signed)
Spoke to patient she stated she returned FMLA paperwork about 3 weeks ago.She wanted to know if it has been taken care of.Advised I will send message to Dr.Croitoru's RN.

## 2019-10-24 NOTE — Telephone Encounter (Signed)
Patient is calling to follow up in regards to status of FMLA paperwork. Please return call to discuss.

## 2019-10-25 NOTE — Telephone Encounter (Signed)
Patient made aware and verbalized understanding.

## 2019-10-25 NOTE — Telephone Encounter (Signed)
Attempted to reach the patient. Was unable to leave a message due to the voicemail being full.  The FMLA paperwork has already been turned in.

## 2019-11-03 ENCOUNTER — Other Ambulatory Visit: Payer: Self-pay

## 2019-11-03 ENCOUNTER — Ambulatory Visit: Payer: BC Managed Care – PPO | Admitting: Cardiovascular Disease

## 2019-11-03 ENCOUNTER — Encounter: Payer: Self-pay | Admitting: Cardiovascular Disease

## 2019-11-03 VITALS — BP 152/77 | HR 74 | Ht 60.0 in | Wt 141.6 lb

## 2019-11-03 DIAGNOSIS — I150 Renovascular hypertension: Secondary | ICD-10-CM | POA: Diagnosis not present

## 2019-11-03 DIAGNOSIS — R55 Syncope and collapse: Secondary | ICD-10-CM | POA: Diagnosis not present

## 2019-11-03 DIAGNOSIS — E7800 Pure hypercholesterolemia, unspecified: Secondary | ICD-10-CM

## 2019-11-03 DIAGNOSIS — R519 Headache, unspecified: Secondary | ICD-10-CM

## 2019-11-03 DIAGNOSIS — Z8673 Personal history of transient ischemic attack (TIA), and cerebral infarction without residual deficits: Secondary | ICD-10-CM

## 2019-11-03 NOTE — Patient Instructions (Signed)

## 2019-11-03 NOTE — Progress Notes (Signed)
Patient ID: Sheri Perkins, female   DOB: 1961/03/02, 59 y.o.   MRN: [id number]    Cardiology Office Note    Date:  11/03/2019   ID:  Sheri Perkins, DOB [birth year], MRN [id number]  PCP:  Enid Skeens., MD  Cardiologist:   Sanda Klein, MD   Chief Complaint  Patient presents with  . Hyperlipidemia  . Hypertension    History of Present Illness:  ARNITA Perkins is a 59 y.o. female with a history of a 60-99% right renal artery stenosis, presumed secondary to fibromuscular dysplasia, neurally mediated syncope, previous smoking and mild hyperlipidemia.  She has not had any episodes of syncope and denies problems with chest pain, shortness of breath or palpitations either at rest or with activity.  She does not have leg edema.  She continues to have bad headaches at work, but for the most part these are under control with either acetaminophen or ibuprofen which she takes on an alternating basis.  Headaches only occur when she is at work.  Also taking Topamax and midodrine seen by Dr. Leonie Man.  In the past she had an episode suggestive of right hemispheric TIA (numbness and clumsiness in the left upper extremity and left side of her face), no recurrence in approximately 2 years.  Carotid ultrasonography did not show any evidence of obstruction.  She is now taking clopidogrel and aspirin.  MRI June 2020 was normal.  She has an aortic ejection murmur.  Echo on March 2019 showed aortic valve sclerosis without stenosis.   Past Medical History:  Diagnosis Date  . Acute bronchiolitis   . Allergy   . Arthritis    fingers , back, neck   . Hyperlipidemia   . Renal artery stenosis (HCC) 04/03/2011   right 1-59% proximal reduction,left main 1-59% reduction,  . Renovascular hypertension   . TIA (transient ischemic attack)   . Tobacco abuse     Past Surgical History:  Procedure Laterality Date  . arm surgery Bilateral   . bilateral carpal tunnel surgery    . CARPAL TUNNEL RELEASE  Bilateral   . COLONOSCOPY    . FL INJ LEFT KNEE CT ARTHROGRAM ([facility] HX)    . HAND SURGERY    . KNEE SURGERY    . POLYPECTOMY    . SHOULDER SURGERY Left   . surgery to right thumb    . ulnar nerve relocation bilaterally    . US ECHOCARDIOGRAPHY  06/16/2010   normal    Outpatient Medications Prior to Visit  Medication Sig Dispense Refill  . aspirin EC 81 MG tablet Take 81 mg by mouth daily.    Marland Kitchen atorvastatin (LIPITOR) 20 MG tablet Take 1 tablet by mouth once daily 90 tablet 1  . Cholecalciferol 1.25 MG (50000 UT) TABS Take by mouth.    . cyclobenzaprine (FLEXERIL) 5 MG tablet Take 5 mg by mouth at bedtime. (Patient not taking: Reported on 11/03/2019)    . losartan (COZAAR) 50 MG tablet Take 1 tablet (50 mg total) by mouth daily. 30 tablet 8  . topiramate (TOPAMAX) 25 MG tablet Take 2 tablets (50 mg total) by mouth 2 (two) times daily. (Patient not taking: Reported on 11/03/2019) 120 tablet 2   Facility-Administered Medications Prior to Visit  Medication Dose Route Frequency Provider Last Rate Last Admin  . clopidogrel (PLAVIX) tablet 75 mg  75 mg Oral Daily Garvin Fila, MD         Allergies:   Macrobid [nitrofurantoin macrocrystal], Latex,  and Sulfa antibiotics   Social History   Socioeconomic History  . Marital status: Married    Spouse name: Not on file  . Number of children: Not on file  . Years of education: Not on file  . Highest education level: Not on file  Occupational History  . Not on file  Tobacco Use  . Smoking status: Former Smoker    Packs/day: 0.12    Types: Cigarettes    Quit date: 04/03/2017    Years since quitting: 2.5  . Smokeless tobacco: Never Used  Substance and Sexual Activity  . Alcohol use: No    Alcohol/week: 0.0 standard drinks  . Drug use: No  . Sexual activity: Not on file  Other Topics Concern  . Not on file  Social History Narrative  . Not on file   Social Determinants of Health   Financial Resource Strain:   . Difficulty of  Paying Living Expenses:   Food Insecurity:   . Worried About Charity fundraiser in the Last Year:   . Arboriculturist in the Last Year:   Transportation Needs:   . Film/video editor (Medical):   Marland Kitchen Lack of Transportation (Non-Medical):   Physical Activity:   . Days of Exercise per Week:   . Minutes of Exercise per Session:   Stress:   . Feeling of Stress :   Social Connections:   . Frequency of Communication with Friends and Family:   . Frequency of Social Gatherings with Friends and Family:   . Attends Religious Services:   . Active Member of Clubs or Organizations:   . Attends Archivist Meetings:   Marland Kitchen Marital Status:      Family History:  The patient's mother has coronary artery disease status post stent, obstructive sleep apnea, hyperlipidemia  ROS:   Please see the history of present illness.    ROS  All other systems are reviewed and are negative.   PHYSICAL EXAM:   VS:  BP (!) 152/77   Pulse 74   Ht 5' (1.524 m)   Wt 141 lb 9.6 oz (64.2 kg)   SpO2 99%   BMI 27.65 kg/m      General: Alert, oriented x3, no distress, mildly overweight Head: no evidence of trauma, PERRL, EOMI, no exophtalmos or lid lag, no myxedema, no xanthelasma; normal ears, nose and oropharynx Neck: normal jugular venous pulsations and no hepatojugular reflux; brisk carotid pulses without delay and no carotid bruits Chest: clear to auscultation, no signs of consolidation by percussion or palpation, normal fremitus, symmetrical and full respiratory excursions Cardiovascular: normal position and quality of the apical impulse, regular rhythm, normal first and second heart sounds, 1-2/6 early peaking aortic ejection murmur, no diastolic murmurs, rubs or gallops Abdomen: no tenderness or distention, no masses by palpation, no abnormal pulsatility or arterial bruits, normal bowel sounds, no hepatosplenomegaly Extremities: no clubbing, cyanosis or edema; 2+ radial, ulnar and brachial pulses  bilaterally; 2+ right femoral, posterior tibial and dorsalis pedis pulses; 2+ left femoral, posterior tibial and dorsalis pedis pulses; no subclavian or femoral bruits.  Prominent varicose veins. Neurological: grossly nonfocal Psych: Normal mood and affect   Wt Readings from Last 3 Encounters:  11/03/19 141 lb 9.6 oz (64.2 kg)  03/09/19 144 lb (65.3 kg)  11/03/18 144 lb (65.3 kg)      Studies/Labs Reviewed:   EKG:  EKG is ordered today.  Normal sinus rhythm, normal ECG, QTC 444 ms  Recent Labs:  Lipid Panel     Component Value Date/Time   CHOL 126 08/25/2018 1345   TRIG 90 08/25/2018 1345   HDL 47 08/25/2018 1345   CHOLHDL 2.7 08/25/2018 1345   CHOLHDL 4.1 05/10/2015 0910   VLDL 15 05/10/2015 0910   LDLCALC 61 08/25/2018 1345   BMET    Component Value Date/Time   NA 144 01/28/2018 0946   K 4.1 01/28/2018 0946   CL 104 01/28/2018 0946   CO2 25 01/28/2018 0946   GLUCOSE 86 01/28/2018 0946   GLUCOSE 102 (H) 04/01/2017 0841   BUN 11 01/28/2018 0946   CREATININE 0.67 01/28/2018 0946   CREATININE 0.64 05/10/2015 0910   CALCIUM 9.2 01/28/2018 0946   GFRNONAA 97 01/28/2018 0946   GFRAA 112 01/28/2018 0946     ASSESSMENT:    1. History of TIA (transient ischemic attack)   2. Renovascular hypertension   3. Pure hypercholesterolemia   4. Neurocardiogenic syncope   5. New onset of headaches      PLAN:  In order of problems listed above:  1. TIA: dual antiplatelet therapy, no recurrence of symptoms in a couple of years. 2. Renal artery stenosis/renovascular hypertension: Typically her blood pressure is well controlled.  Upon May 25 her blood pressure is 126/73.  Typical systolic blood pressure when she checks it at home is 130.  Blood pressure slightly elevated: Today no changes made to her medications, but I asked her to make sure she has her home monitor checked against another device. 3. HLP: On statin.  The most recent LDL cholesterol I have available for review  was 61 a year ago, will request labs from 2 months ago performed with her PCP. 4. Neurally mediated syncope: No recurrence, avoid dehydration, prolonged immobilization in a standing position, extreme heat, etc. 5. Headaches: Some improvement.  She alternates between acetaminophen and nonsteroidal anti-inflammatory drugs.    Medication Adjustments/Labs and Tests Ordered: Current medicines are reviewed at length with the patient today.  Concerns regarding medicines are outlined above.  Medication changes, Labs and Tests ordered today are listed in the Patient Instructions below. Patient Instructions  Medication Instructions:  No changes *If you need a refill on your cardiac medications before your next appointment, please call your pharmacy*   Lab Work: None ordered If you have labs (blood work) drawn today and your tests are completely normal, you will receive your results only by: Marland Kitchen MyChart Message (if you have MyChart) OR . A paper copy in the mail If you have any lab test that is abnormal or we need to change your treatment, we will call you to review the results.   Testing/Procedures: None ordered   Follow-Up: At [facility], you and your health needs are our priority.  As part of our continuing mission to provide you with exceptional heart care, we have created designated Provider Care Teams.  These Care Teams include your primary Cardiologist (physician) and Advanced Practice Providers (APPs -  Physician Assistants and Nurse Practitioners) who all work together to provide you with the care you need, when you need it.  We recommend signing up for the patient portal called "MyChart".  Sign up information is provided on this After Visit Summary.  MyChart is used to connect with patients for Virtual Visits (Telemedicine).  Patients are able to view lab/test results, encounter notes, upcoming appointments, etc.  Non-urgent messages can be sent to your provider as well.   To learn  more about what you can do with  MyChart, go to [email or web address].    Your next appointment:   12 month(s)  The format for your next appointment:   In Person  Provider:   You may see Sanda Klein, MD or one of the following Advanced Practice Providers on your designated Care Team:    Almyra Deforest, PA-C  Fabian Sharp, [place] or   Roby Lofts, PA-C         Signed, Sanda Klein, MD  11/03/2019 9:08 AM    [address], [place], [place]  [address] Phone: [phone]; Fax: [phone]

## 2019-12-11 ENCOUNTER — Telehealth: Payer: Self-pay | Admitting: Cardiovascular Disease

## 2019-12-11 NOTE — Telephone Encounter (Signed)
NEW Message    1. Are you calling in reference to your FMLA or disability form? FMLA    2. What is your question in regards to FMLA or disability form? She would like it revised , She stated that her 1/2 days were not added on there.   She would like it to say 2 half day and 1 full day per week   3. Do you need copies of your medical records? n/a   4. Are you waiting on a nurse to call you back with results or are you wanting copies of your results? Pt is wanted the recent FMLA revised    Best number - [phone] anytime after 12 on 9/21  Please route to Medical Records or your medical records site representative

## 2019-12-19 ENCOUNTER — Telehealth: Payer: Self-pay | Admitting: Cardiovascular Disease

## 2019-12-19 NOTE — Telephone Encounter (Signed)
Pt c/o BP issue: STAT if pt c/o blurred vision, one-sided weakness or slurred speech  1. What are your last 5 BP readings? Has been running in 150's-160's  2. Are you having any other symptoms (ex. Dizziness, headache, blurred vision, passed out)? Tingling in right arm   3. What is your BP issue? Has been hypertension and tingling for about two weeks  Pt has been scheduled for 02/01/20 in regards to this.

## 2019-12-20 NOTE — Telephone Encounter (Signed)
Left VM to call back 

## 2019-12-21 NOTE — Telephone Encounter (Signed)
Left message for pt to call.

## 2019-12-22 ENCOUNTER — Ambulatory Visit: Payer: BC Managed Care – PPO | Admitting: Cardiovascular Disease

## 2019-12-26 NOTE — Telephone Encounter (Signed)
Spoke with the patient who states that she has been having high blood pressure readings recently. She states that the top number has been running in 160s. She does not have diastolic numbers or HR. She states that she has only had a couple readings that were normal in 120s/80s. She also reports tingling in her right arm and hand at times. She states that sometimes she has numbness. She also reports some swelling in her ankle. She denies any other symptoms such as chest pain, SOB, dizziness, palpitations. Advised patient that I would route to Dr. Sallyanne Kuster for any recommendations.

## 2019-12-26 NOTE — Telephone Encounter (Signed)
Sheri Perkins is returning Sheri Perkins's call.

## 2019-12-26 NOTE — Telephone Encounter (Signed)
Please increase the losartan to 100 mg daily and make follow up appt in 2-4 weeks with APP or Pharm D (I will be out of office).

## 2019-12-26 NOTE — Telephone Encounter (Signed)
Attempted to call patient back. Unable to leave voicemail. She has appointment scheduled with Coletta Memos 11/10

## 2019-12-28 MED ORDER — LOSARTAN POTASSIUM 100 MG PO TABS
100.0000 mg | ORAL_TABLET | Freq: Every day | ORAL | 3 refills | Status: DC
Start: 2019-12-28 — End: 2020-03-25

## 2019-12-28 NOTE — Telephone Encounter (Signed)
Pt updated with MD's recommendations and verbalized understanding. Appointment scheduled for 10/27 with Kerin Ransom, PA.

## 2020-01-17 ENCOUNTER — Ambulatory Visit (INDEPENDENT_AMBULATORY_CARE_PROVIDER_SITE_OTHER): Payer: BC Managed Care – PPO | Admitting: Cardiology

## 2020-01-17 ENCOUNTER — Other Ambulatory Visit: Payer: Self-pay

## 2020-01-17 ENCOUNTER — Encounter: Payer: Self-pay | Admitting: Cardiology

## 2020-01-17 DIAGNOSIS — Z8673 Personal history of transient ischemic attack (TIA), and cerebral infarction without residual deficits: Secondary | ICD-10-CM

## 2020-01-17 DIAGNOSIS — I701 Atherosclerosis of renal artery: Secondary | ICD-10-CM

## 2020-01-17 DIAGNOSIS — E785 Hyperlipidemia, unspecified: Secondary | ICD-10-CM | POA: Diagnosis not present

## 2020-01-17 DIAGNOSIS — I10 Essential (primary) hypertension: Secondary | ICD-10-CM

## 2020-01-17 NOTE — Progress Notes (Signed)
Cardiology Office Note:    Date:  01/17/2020   ID:  DWIGHT BURDO, DOB [birth year], MRN [id number]  PCP:  Enid Skeens., MD  Cardiologist:  Sanda Klein, MD  Electrophysiologist:  None   Referring MD: Enid Skeens., MD   No chief complaint on file. B/P follow up  History of Present Illness:    Sheri Perkins is a 59 y.o. female with a hx of hypertension, right renal artery FMD documented in 2013 with a stenosis of 60 to 99%, dyslipidemia on statin therapy, history of TIA in the past, seen by Dr. Leonie Man and treated with Plavix and aspirin, carotid Dopplers negative, history of smoking, and history of chronic headaches.  Echocardiogram in March 2019 showed normal LV function with aortic valve sclerosis.  Recently the patient has noted an increase in her blood pressure on her home cuff.  Dr. Sallyanne Kuster increased her losartan from 50 mg a day 200 mg a day and she is in the office today for follow-up.  Her labs are drawn by her PCP.  Blood pressure by me today in the office was 128/62.  The patient did tell me she is no longer taking Plavix, she ran out and never refilled it.  She is also had problems with headaches at work related to wearing a mask. Apparently Dr. Sallyanne Kuster had given her a note to take a couple days off from work for this and it was not worded correctly for her Workmen's Comp. I asked her to get in witting what her workman's comp wanted and we would see if we could help.  I also suggested she resume her Plavix and arrange a f/u with Dr Leonie Man.   Past Medical History:  Diagnosis Date  . Acute bronchiolitis   . Allergy   . Arthritis    fingers , back, neck   . Hyperlipidemia   . Renal artery stenosis (HCC) 04/03/2011   right 1-59% proximal reduction,left main 1-59% reduction,  . Renovascular hypertension   . TIA (transient ischemic attack)   . Tobacco abuse     Past Surgical History:  Procedure Laterality Date  . arm surgery Bilateral   . bilateral carpal  tunnel surgery    . CARPAL TUNNEL RELEASE Bilateral   . COLONOSCOPY    . FL INJ LEFT KNEE CT ARTHROGRAM ([facility] HX)    . HAND SURGERY    . KNEE SURGERY    . POLYPECTOMY    . SHOULDER SURGERY Left   . surgery to right thumb    . ulnar nerve relocation bilaterally    . US ECHOCARDIOGRAPHY  06/16/2010   normal    Current Medications: Current Meds  Medication Sig  . aspirin EC 81 MG tablet Take 81 mg by mouth daily.  Marland Kitchen atorvastatin (LIPITOR) 20 MG tablet Take 1 tablet by mouth once daily  . Cholecalciferol 1.25 MG (50000 UT) TABS Take by mouth.  . losartan (COZAAR) 100 MG tablet Take 1 tablet (100 mg total) by mouth daily.  Marland Kitchen topiramate (TOPAMAX) 25 MG tablet Take 2 tablets (50 mg total) by mouth 2 (two) times daily.   Current Facility-Administered Medications for the 01/17/20 encounter (Office Visit) with Erlene Quan, PA-C  Medication  . clopidogrel (PLAVIX) tablet 75 mg     Allergies:   Macrobid [nitrofurantoin macrocrystal], Latex, and Sulfa antibiotics   Social History   Socioeconomic History  . Marital status: Married    Spouse name: Not on file  . Number of children:  Not on file  . Years of education: Not on file  . Highest education level: Not on file  Occupational History  . Not on file  Tobacco Use  . Smoking status: Former Smoker    Packs/day: 0.12    Types: Cigarettes    Quit date: 04/03/2017    Years since quitting: 2.7  . Smokeless tobacco: Never Used  Substance and Sexual Activity  . Alcohol use: No    Alcohol/week: 0.0 standard drinks  . Drug use: No  . Sexual activity: Not on file  Other Topics Concern  . Not on file  Social History Narrative  . Not on file   Social Determinants of Health   Financial Resource Strain:   . Difficulty of Paying Living Expenses: Not on file  Food Insecurity:   . Worried About Charity fundraiser in the Last Year: Not on file  . Ran Out of Food in the Last Year: Not on file  Transportation Needs:   . Lack of  Transportation (Medical): Not on file  . Lack of Transportation (Non-Medical): Not on file  Physical Activity:   . Days of Exercise per Week: Not on file  . Minutes of Exercise per Session: Not on file  Stress:   . Feeling of Stress : Not on file  Social Connections:   . Frequency of Communication with Friends and Family: Not on file  . Frequency of Social Gatherings with Friends and Family: Not on file  . Attends Religious Services: Not on file  . Active Member of Clubs or Organizations: Not on file  . Attends Archivist Meetings: Not on file  . Marital Status: Not on file     Family History: The patient's family history includes Melanoma in her sister. There is no history of Colon cancer, Colon polyps, Rectal cancer, or Stomach cancer.  ROS:   Please see the history of present illness.    "tingling" Rt arm- intermittent  All other systems reviewed and are negative.  EKGs/Labs/Other Studies Reviewed:    The following studies were reviewed today: Echo 05/28/2017- Study Conclusions   - Left ventricle: The cavity size was normal. Wall thickness was  normal. Systolic function was normal. The estimated ejection  fraction was in the range of 55% to 60%. Wall motion was normal;  there were no regional wall motion abnormalities. Left  ventricular diastolic function parameters were normal.   Impressions:   - Normal study.   EKG:  EKG is not ordered today.  The ekg ordered 11/06/2019 demonstrates NSR, HR 74  Recent Labs: No results found for requested labs within last 8760 hours.  Recent Lipid Panel    Component Value Date/Time   CHOL 126 08/25/2018 1345   TRIG 90 08/25/2018 1345   HDL 47 08/25/2018 1345   CHOLHDL 2.7 08/25/2018 1345   CHOLHDL 4.1 05/10/2015 0910   VLDL 15 05/10/2015 0910   LDLCALC 61 08/25/2018 1345    Physical Exam:    VS:  BP (!) 152/76   Pulse 68   Ht 5' (1.524 m)   Wt 143 lb 6.4 oz (65 kg)   BMI 28.01 kg/m     Wt Readings  from Last 3 Encounters:  01/17/20 143 lb 6.4 oz (65 kg)  11/03/19 141 lb 9.6 oz (64.2 kg)  03/09/19 144 lb (65.3 kg)     GEN: Thin, Caucasian female, looks older than her stated age, in no acute distress HEENT: Normal NECK: No JVD; No carotid  bruits CARDIAC: RRR, no murmurs, rubs, gallops RESPIRATORY:  Decreased breath sounds ABDOMEN: Soft, non-tender, non-distended MUSCULOSKELETAL:  No edema; No deformity  SKIN: Warm and dry NEUROLOGIC:  Alert and oriented x 3- grossly intact, no obvious deficit.  PSYCHIATRIC:  Normal affect   ASSESSMENT:    Essential hypertension B/P controlled by my check on increased Losartan  Renal artery stenosis (HCC) Right renal artery FMD 2013-normal renal function per PCPs labs  Dyslipidemia On statin Rx  History of TIA (transient ischemic attack) Placed on asa and Plavix by Dr Leonie Man in the past- she stopped the Plavix on her own.  I suggested she resume this and f/u with Dr Leonie Man  Head ache F/U with Dr Leonie Man about this  PLAN:    Resume Plavix till she sees Dr Leonie Man.  Check BMP in one week- can see her PCP for this.    Medication Adjustments/Labs and Tests Ordered: Current medicines are reviewed at length with the patient today.  Concerns regarding medicines are outlined above.  No orders of the defined types were placed in this encounter.  No orders of the defined types were placed in this encounter.   Patient Instructions  Medication Instructions:  Restart Plavix 75 mg by mouth daily. *If you need a refill on your cardiac medications before your next appointment, please call your pharmacy*   Lab Work: None ordered.   Testing/Procedures: None ordered.   Follow-Up: At [facility], you and your health needs are our priority.  As part of our continuing mission to provide you with exceptional heart care, we have created designated Provider Care Teams.  These Care Teams include your primary Cardiologist (physician) and Advanced  Practice Providers (APPs -  Physician Assistants and Nurse Practitioners) who all work together to provide you with the care you need, when you need it.  We recommend signing up for the patient portal called "MyChart".  Sign up information is provided on this After Visit Summary.  MyChart is used to connect with patients for Virtual Visits (Telemedicine).  Patients are able to view lab/test results, encounter notes, upcoming appointments, etc.  Non-urgent messages can be sent to your provider as well.   To learn more about what you can do with MyChart, go to [email or web address].    Your next appointment:   6 month(s)  The format for your next appointment:   In Person  Provider:   You may see Sanda Klein, MD or one of the following Advanced Practice Providers on your designated Care Team:    Almyra Deforest, PA-C  Fabian Sharp, [place] or   Roby Lofts, [place]    Other Instructions Make appt with Dr. Leonie Man as soon as possible.     Signed, Kerin Ransom, PA-C  01/17/2020 11:15 AM    [facility]

## 2020-01-17 NOTE — Assessment & Plan Note (Signed)
B/P controlled by my check on increased Losartan

## 2020-01-17 NOTE — Patient Instructions (Signed)
Medication Instructions:  Restart Plavix 75 mg by mouth daily. *If you need a refill on your cardiac medications before your next appointment, please call your pharmacy*   Lab Work: None ordered.   Testing/Procedures: None ordered.   Follow-Up: At [facility], you and your health needs are our priority.  As part of our continuing mission to provide you with exceptional heart care, we have created designated Provider Care Teams.  These Care Teams include your primary Cardiologist (physician) and Advanced Practice Providers (APPs -  Physician Assistants and Nurse Practitioners) who all work together to provide you with the care you need, when you need it.  We recommend signing up for the patient portal called "MyChart".  Sign up information is provided on this After Visit Summary.  MyChart is used to connect with patients for Virtual Visits (Telemedicine).  Patients are able to view lab/test results, encounter notes, upcoming appointments, etc.  Non-urgent messages can be sent to your provider as well.   To learn more about what you can do with MyChart, go to [email or web address].    Your next appointment:   6 month(s)  The format for your next appointment:   In Person  Provider:   You may see Sanda Klein, MD or one of the following Advanced Practice Providers on your designated Care Team:    Almyra Deforest, PA-C  Fabian Sharp, [place] or   Roby Lofts, [place]    Other Instructions Make appt with Dr. Leonie Man as soon as possible.

## 2020-01-17 NOTE — Assessment & Plan Note (Signed)
Right renal artery FMD 2013-normal renal function per PCPs labs

## 2020-01-17 NOTE — Assessment & Plan Note (Signed)
On statin Rx 

## 2020-01-17 NOTE — Assessment & Plan Note (Signed)
Placed on asa and Plavix by Dr Leonie Man in the past- she stopped the Plavix on her own.  I suggested she resume this and f/u with Dr Leonie Man

## 2020-01-17 NOTE — Assessment & Plan Note (Signed)
F/U with Dr Leonie Man about this

## 2020-01-19 ENCOUNTER — Telehealth: Payer: Self-pay | Admitting: Cardiovascular Disease

## 2020-01-19 MED ORDER — CLOPIDOGREL BISULFATE 75 MG PO TABS: 75.0000 mg | ORAL_TABLET | Freq: Every day | ORAL | 3 refills | Status: DC

## 2020-01-19 NOTE — Telephone Encounter (Signed)
Spoke to pt who report she was started on a new medication and wanted to know when we were sending over prescription to the pharmacy. Per chart review on 10/27, nurse informed pt that PA only recommended to restart Plavix 75 mg daily. Pt verbalized understanding and report she need a new prescription sent to the pharmacy. New Rx sent to the requested pharmacy.

## 2020-01-19 NOTE — Telephone Encounter (Signed)
New message:     Patient calling to see when will she get her new medication that the doctor put her on. Patient did not know the name of the medication. Please call patient back.

## 2020-01-22 DIAGNOSIS — M47812 Spondylosis without myelopathy or radiculopathy, cervical region: Secondary | ICD-10-CM | POA: Diagnosis not present

## 2020-01-22 DIAGNOSIS — M25561 Pain in right knee: Secondary | ICD-10-CM | POA: Diagnosis not present

## 2020-02-01 ENCOUNTER — Ambulatory Visit: Payer: BC Managed Care – PPO | Admitting: General Practice

## 2020-02-21 DIAGNOSIS — M50323 Other cervical disc degeneration at C6-C7 level: Secondary | ICD-10-CM | POA: Diagnosis not present

## 2020-02-21 DIAGNOSIS — M50322 Other cervical disc degeneration at C5-C6 level: Secondary | ICD-10-CM | POA: Diagnosis not present

## 2020-02-21 DIAGNOSIS — M5412 Radiculopathy, cervical region: Secondary | ICD-10-CM | POA: Diagnosis not present

## 2020-02-22 DIAGNOSIS — M19042 Primary osteoarthritis, left hand: Secondary | ICD-10-CM | POA: Diagnosis not present

## 2020-02-22 DIAGNOSIS — N3001 Acute cystitis with hematuria: Secondary | ICD-10-CM | POA: Diagnosis not present

## 2020-02-22 DIAGNOSIS — M79642 Pain in left hand: Secondary | ICD-10-CM | POA: Diagnosis not present

## 2020-02-22 DIAGNOSIS — N390 Urinary tract infection, site not specified: Secondary | ICD-10-CM | POA: Diagnosis not present

## 2020-02-23 DIAGNOSIS — M47812 Spondylosis without myelopathy or radiculopathy, cervical region: Secondary | ICD-10-CM | POA: Diagnosis not present

## 2020-02-27 DIAGNOSIS — M5412 Radiculopathy, cervical region: Secondary | ICD-10-CM | POA: Diagnosis not present

## 2020-02-27 DIAGNOSIS — M50323 Other cervical disc degeneration at C6-C7 level: Secondary | ICD-10-CM | POA: Diagnosis not present

## 2020-02-27 DIAGNOSIS — M50322 Other cervical disc degeneration at C5-C6 level: Secondary | ICD-10-CM | POA: Diagnosis not present

## 2020-03-01 DIAGNOSIS — M50322 Other cervical disc degeneration at C5-C6 level: Secondary | ICD-10-CM | POA: Diagnosis not present

## 2020-03-01 DIAGNOSIS — M50323 Other cervical disc degeneration at C6-C7 level: Secondary | ICD-10-CM | POA: Diagnosis not present

## 2020-03-01 DIAGNOSIS — M5412 Radiculopathy, cervical region: Secondary | ICD-10-CM | POA: Diagnosis not present

## 2020-03-01 DIAGNOSIS — M79641 Pain in right hand: Secondary | ICD-10-CM | POA: Diagnosis not present

## 2020-03-05 DIAGNOSIS — M50323 Other cervical disc degeneration at C6-C7 level: Secondary | ICD-10-CM | POA: Diagnosis not present

## 2020-03-05 DIAGNOSIS — M5412 Radiculopathy, cervical region: Secondary | ICD-10-CM | POA: Diagnosis not present

## 2020-03-05 DIAGNOSIS — M50322 Other cervical disc degeneration at C5-C6 level: Secondary | ICD-10-CM | POA: Diagnosis not present

## 2020-03-08 DIAGNOSIS — W19XXXA Unspecified fall, initial encounter: Secondary | ICD-10-CM | POA: Diagnosis not present

## 2020-03-08 DIAGNOSIS — S0093XA Contusion of unspecified part of head, initial encounter: Secondary | ICD-10-CM | POA: Diagnosis not present

## 2020-03-19 DIAGNOSIS — M50323 Other cervical disc degeneration at C6-C7 level: Secondary | ICD-10-CM | POA: Diagnosis not present

## 2020-03-19 DIAGNOSIS — M5412 Radiculopathy, cervical region: Secondary | ICD-10-CM | POA: Diagnosis not present

## 2020-03-19 DIAGNOSIS — M50322 Other cervical disc degeneration at C5-C6 level: Secondary | ICD-10-CM | POA: Diagnosis not present

## 2020-03-20 ENCOUNTER — Telehealth: Payer: Self-pay | Admitting: Cardiovascular Disease

## 2020-03-20 NOTE — Telephone Encounter (Signed)
Pt c/o BP issue: STAT if pt c/o blurred vision, one-sided weakness or slurred speech  1. What are your last 5 BP readings?  12/29: 85/58   2. Are you having any other symptoms (ex. Dizziness, headache, blurred vision, passed out)? Pt feels clammy and feels like she is going to pass out. Pt has not passed out though   3. What is your BP issue? Pt is concerned about her BP being low

## 2020-03-20 NOTE — Telephone Encounter (Signed)
I attempted to contact patient to discuss BP issues.  Patient did not answer, did not have voicemail set up.  Will try again later to check on patient.

## 2020-03-25 MED ORDER — LOSARTAN POTASSIUM 50 MG PO TABS: 50.0000 mg | ORAL_TABLET | Freq: Every day | ORAL | 3 refills | Status: DC

## 2020-03-25 NOTE — Telephone Encounter (Signed)
Spoke to patient Dr.Croitoru's advice given.New prescription for Losartan 50 mg daily sent to pharmacy.Advised to keep appointment with Edd Fabian as planned.

## 2020-03-25 NOTE — Telephone Encounter (Signed)
Patient returning call, states she did not realize we had reached out to her.   Update on BP Pt c/o BP issue: STAT if pt c/o blurred vision, one-sided weakness or slurred speech  1. What are your last 5 BP readings?  12/30: 101/62 12/30: 136/72 12/31: 102/75 01/03: 112/62   2. Are you having any other symptoms (ex. Dizziness, headache, blurred vision, passed out)?  Head just feels weird, she states her sister said she was slurring her speech on Thursday but patient didn't think she was.  3. What is your BP issue?  BP dropped last Wednesday 12/29 and patient is following up on that.

## 2020-03-25 NOTE — Telephone Encounter (Signed)
Please reduce the losartan to 50 mg daily and keep appt w Verdon Cummins.

## 2020-03-25 NOTE — Telephone Encounter (Signed)
Spoke to patient she stated she has been having funny feelings in head off and on.Stated hard to describe.No dizziness.No headache,no pain.Stated last week after driving 50 miles to work she had a episode getting out of car she felt faint,clammy.Stated she got inside building sat down and drank a glass of water.Stated she never passed out but did not feel good most of day.Stated her sister told her she noticed this past Friday her speech was slurred.B/P readings listed below.B/P at present 103/59 pulse 69.She feels weak,no energy.She takes Lorsartan 100 mg daily.No appointments available this week.Appointment scheduled with Edd Fabian 1/11 at 3:15 pm.Appointment scheduled with Dr.Croitoru 1/19 at 11:40 am.Advised Dr.Croitoru out of office.I will send message to him for advice.

## 2020-03-30 DIAGNOSIS — M25541 Pain in joints of right hand: Secondary | ICD-10-CM | POA: Diagnosis not present

## 2020-03-30 DIAGNOSIS — M109 Gout, unspecified: Secondary | ICD-10-CM | POA: Diagnosis not present

## 2020-04-01 NOTE — Progress Notes (Signed)
Cardiology Clinic Note   Patient Name: Sheri Perkins Date of Encounter: 04/02/2020  Primary Care Provider:  Enid Skeens., MD Primary Cardiologist:  Sanda Klein, MD  Patient Profile    Sheri Perkins 60 year old female presents the clinic today for follow-up evaluation of her essential hypertension.  Losartan previously reduced due to hypotension.  03/20/2020  Past Medical History    Past Medical History:  Diagnosis Date  . Acute bronchiolitis   . Allergy   . Arthritis    fingers , back, neck   . Hyperlipidemia   . Renal artery stenosis (HCC) 04/03/2011   right 1-59% proximal reduction,left main 1-59% reduction,  . Renovascular hypertension   . TIA (transient ischemic attack)   . Tobacco abuse    Past Surgical History:  Procedure Laterality Date  . arm surgery Bilateral   . bilateral carpal tunnel surgery    . CARPAL TUNNEL RELEASE Bilateral   . COLONOSCOPY    . FL INJ LEFT KNEE CT ARTHROGRAM ([facility] HX)    . HAND SURGERY    . KNEE SURGERY    . POLYPECTOMY    . SHOULDER SURGERY Left   . surgery to right thumb    . ulnar nerve relocation bilaterally    . US ECHOCARDIOGRAPHY  06/16/2010   normal    Allergies  Allergies  Allergen Reactions  . Macrobid [Nitrofurantoin Macrocrystal] Other (See Comments)    "burned lungs"  . Latex Itching       . Sulfa Antibiotics Hives and Rash    History of Present Illness    Sheri Perkins has a PMH of hypertension, right renal artery FMD 2013 with stenosis of 60-99%, dyslipidemia on statin therapy, TIA follows with neurology on Plavix and aspirin, carotid Dopplers negative, history of smoking, history of chronic headaches, echocardiogram 3/19 showed normal LV function with aortic valve sclerosis.  She was seen by Kerin Ransom, PA-C on 01/17/2020.  During that time she had noted an increase in her blood pressure at home.  Her losartan was increased from 50 mg/day up to 200 mg/day.  Her blood pressure at that time was  128/62.  She reported that she had run out of Plavix and then not refill her medication.  She also reported headaches at work due to wearing her mask.  Dr. Andree Coss had given her a note for a couple days off work for this.  However, she reported it was not ordered correctly for her Workmen's Comp.  She was asked to gather details and report back so that we may help with this.  It was recommended that she resume her Plavix and arrange follow-up with Dr.Sethi.  She is seen in the clinic today and states she feels well today. She reports some redness and swelling over the last month and bilateral hands. She reported to urgent care who prescribed prednisone. She reports that this helped with her hand pain and swelling. She reports that her blood pressure is much better with reduction in her l losartan. She goes on to report varying episodes of dizziness that are intermittent in the morning. She is neurologically intact. She denies palpitations, presyncope and syncope. Due to her history of TIA I recommended that she follow-up with neurology. She also reports that she has stopped taking her clopidogrel. I also recommended that she restart this medication along with her aspirin. She also states that she has quit smoking as of March 19, 2020. I will give her the salty 6 diet  sheet, have her increase her physical activity as tolerated, and follow-up in 6 months.  Today she denies chest pain, shortness of breath, lower extremity edema, fatigue, palpitations, melena, hematuria, hemoptysis, diaphoresis, weakness, presyncope, syncope, orthopnea, and PND.   Home Medications    Prior to Admission medications   Medication Sig Start Date End Date Taking? Authorizing Provider  aspirin EC 81 MG tablet Take 81 mg by mouth daily.    [provider]  atorvastatin (LIPITOR) 20 MG tablet Take 1 tablet by mouth once daily 03/07/19   Croitoru, Mihai, MD  Cholecalciferol 1.25 MG (50000 UT) TABS Take by mouth.     [provider]  clopidogrel (PLAVIX) 75 MG tablet Take 1 tablet (75 mg total) by mouth daily. 01/19/20   Erlene Quan, PA-C  losartan (COZAAR) 50 MG tablet Take 1 tablet (50 mg total) by mouth daily. 03/25/20 06/23/20  Croitoru, Mihai, MD  topiramate (TOPAMAX) 25 MG tablet Take 2 tablets (50 mg total) by mouth 2 (two) times daily. 11/03/18 01/17/20  Garvin Fila, MD    Family History    Family History  Problem Relation Age of Onset  . Melanoma Sister   . Colon cancer Neg Hx   . Colon polyps Neg Hx   . Rectal cancer Neg Hx   . Stomach cancer Neg Hx    She indicated that her mother is alive. She indicated that her father is deceased. She indicated that her sister is alive. She indicated that her maternal grandmother is deceased. She indicated that her maternal grandfather is deceased. She indicated that her paternal grandmother is deceased. She indicated that her paternal grandfather is deceased. She indicated that the status of her neg hx is unknown.  Social History    Social History   Socioeconomic History  . Marital status: Married    Spouse name: Not on file  . Number of children: Not on file  . Years of education: Not on file  . Highest education level: Not on file  Occupational History  . Not on file  Tobacco Use  . Smoking status: Former Smoker    Packs/day: 0.12    Types: Cigarettes    Quit date: 04/03/2017    Years since quitting: 3.0  . Smokeless tobacco: Never Used  Substance and Sexual Activity  . Alcohol use: No    Alcohol/week: 0.0 standard drinks  . Drug use: No  . Sexual activity: Not on file  Other Topics Concern  . Not on file  Social History Narrative  . Not on file   Social Determinants of Health   Financial Resource Strain: Not on file  Food Insecurity: Not on file  Transportation Needs: Not on file  Physical Activity: Not on file  Stress: Not on file  Social Connections: Not on file  Intimate Partner Violence: Not on file      Review of Systems    General:  No chills, fever, night sweats or weight changes.  Cardiovascular:  No chest pain, dyspnea on exertion, edema, orthopnea, palpitations, paroxysmal nocturnal dyspnea. Dermatological: No rash, lesions/masses Respiratory: No cough, dyspnea Urologic: No hematuria, dysuria Abdominal:   No nausea, vomiting, diarrhea, bright red blood per rectum, melena, or hematemesis Neurologic:  No visual changes, wkns, changes in mental status. All other systems reviewed and are otherwise negative except as noted above.  Physical Exam    VS:  BP (!) 148/78 (BP Location: Left Arm, Patient Position: Sitting, Cuff Size: Normal)   Pulse 62  Wt 144 lb 3.2 oz (65.4 kg)   SpO2 98%   BMI 28.16 kg/m  , BMI Body mass index is 28.16 kg/m. GEN: Well nourished, well developed, in no acute distress. HEENT: normal. Neck: Supple, no JVD, carotid bruits, or masses. Cardiac: RRR, no murmurs, rubs, or gallops. No clubbing, cyanosis, edema.  Radials/DP/PT 2+ and equal bilaterally.  Respiratory:  Respirations regular and unlabored, clear to auscultation bilaterally. GI: Soft, nontender, nondistended, BS + x 4. MS: Bilateral index finger RA type deformity Skin: warm and dry, no rash. Neuro:  Strength and sensation are intact. Psych: Normal affect.  Accessory Clinical Findings    Recent Labs: No results found for requested labs within last 8760 hours.   Recent Lipid Panel    Component Value Date/Time   CHOL 126 08/25/2018 1345   TRIG 90 08/25/2018 1345   HDL 47 08/25/2018 1345   CHOLHDL 2.7 08/25/2018 1345   CHOLHDL 4.1 05/10/2015 0910   VLDL 15 05/10/2015 0910   LDLCALC 61 08/25/2018 1345    ECG personally reviewed by me today-none today.  EKG 11/06/2019 Normal sinus rhythm 74 bpm  Echocardiogram 05/28/2017 Study Conclusions   - Left ventricle: The cavity size was normal. Wall thickness was  normal. Systolic function was normal. The estimated ejection  fraction  was in the range of 55% to 60%. Wall motion was normal;  there were no regional wall motion abnormalities. Left  ventricular diastolic function parameters were normal.   Impressions:   - Normal study.   Assessment & Plan   1.  Renovascular hypertension-BP today 148/78. Blood pressures at home 115-130 over 70s. She contacted the office on 03/20/2020 and indicated that she was having low blood pressures.  Her losartan was reduced to 50 mg daily at that time. Continue losartan Heart healthy low-sodium diet-salty 6 given Increase physical activity as tolerated  Dyslipidemia-LDL 61 on 08/25/2018 Continue atorvastatin, aspirin, Plavix Heart healthy low-sodium high-fiber diet Increase physical activity as tolerated Follows with PCP  Renal artery stenosis- right renal artery FMD 2013. Request labs from PCP Maintain p.o. hydration Follows with PCP  History of TIA- neurologically intact.  No recent episodes of vision change, balance change, neurological sequela. She does report some varying episodes of dizziness in the morning. She denies palpitations and dehydration. Continue aspirin, Plavix Follow-up with neurology  Hand pain- over the last months she reports bilateral instances of intermittent swelling and joint pain. Appears to be related to rheumatoid arthritis. Recommend follow-up with PCP  Disposition: Follow-up with Dr. Sallyanne Kuster or me in 6 months.   Jossie Ng. Henny Strauch NP-C    04/02/2020, 3:34 PM [address] Office [phone] Fax [phone]  Notice: This dictation was prepared with Dragon dictation along with smaller phrase technology. Any transcriptional errors that result from this process are unintentional and may not be corrected upon review.  I spent 20 minutes examining this patient, reviewing medications, and using patient centered shared decision making involving her cardiac care.  Prior to her visit I spent  greater than 20 minutes reviewing her past medical history,  medications, and prior cardiac tests.

## 2020-04-02 ENCOUNTER — Ambulatory Visit (INDEPENDENT_AMBULATORY_CARE_PROVIDER_SITE_OTHER): Payer: BC Managed Care – PPO | Admitting: General Practice

## 2020-04-02 ENCOUNTER — Telehealth: Payer: Self-pay | Admitting: Neurology

## 2020-04-02 ENCOUNTER — Other Ambulatory Visit: Payer: Self-pay

## 2020-04-02 ENCOUNTER — Encounter: Payer: Self-pay | Admitting: General Practice

## 2020-04-02 VITALS — BP 148/78 | HR 62 | Wt 144.2 lb

## 2020-04-02 DIAGNOSIS — I701 Atherosclerosis of renal artery: Secondary | ICD-10-CM | POA: Diagnosis not present

## 2020-04-02 DIAGNOSIS — I150 Renovascular hypertension: Secondary | ICD-10-CM

## 2020-04-02 DIAGNOSIS — Z8673 Personal history of transient ischemic attack (TIA), and cerebral infarction without residual deficits: Secondary | ICD-10-CM | POA: Diagnosis not present

## 2020-04-02 DIAGNOSIS — E785 Hyperlipidemia, unspecified: Secondary | ICD-10-CM | POA: Diagnosis not present

## 2020-04-02 DIAGNOSIS — M50322 Other cervical disc degeneration at C5-C6 level: Secondary | ICD-10-CM | POA: Diagnosis not present

## 2020-04-02 DIAGNOSIS — M50323 Other cervical disc degeneration at C6-C7 level: Secondary | ICD-10-CM | POA: Diagnosis not present

## 2020-04-02 DIAGNOSIS — M5412 Radiculopathy, cervical region: Secondary | ICD-10-CM | POA: Diagnosis not present

## 2020-04-02 NOTE — Patient Instructions (Signed)
Medication Instructions:  The current medical regimen is effective;  continue present plan and medications as directed. Please refer to the Current Medication list given to you today.  *If you need a refill on your cardiac medications before your next appointment, please call your pharmacy*  Lab Work:   Testing/Procedures:  NONE    NONE  Special Instructions MAKE SURE TO CALL AND SCHEDULE AN APPOINTMENT WITH NEUROLOGY TO DISCUSS YOUR DIZZINESS  PLEASE READ AND FOLLOW SALTY 6-ATTACHED-1,800mg  daily  PLEASE INCREASE PHYSICAL ACTIVITY AS TOLERATED  Follow-Up: Your next appointment:  6 month(s) In Person with Sanda Klein, MD OR IF UNAVAILABLE JESSE CLEAVER, FNP-C   Please call our office 2 months in advance to schedule this appointment   At [facility], you and your health needs are our priority.  As part of our continuing mission to provide you with exceptional heart care, we have created designated Provider Care Teams.  These Care Teams include your primary Cardiologist (physician) and Advanced Practice Providers (APPs -  Physician Assistants and Nurse Practitioners) who all work together to provide you with the care you need, when you need it.  We recommend signing up for the patient portal called "MyChart".  Sign up information is provided on this After Visit Summary.  MyChart is used to connect with patients for Virtual Visits (Telemedicine).  Patients are able to view lab/test results, encounter notes, upcoming appointments, etc.  Non-urgent messages can be sent to your provider as well.   To learn more about what you can do with MyChart, go to [email or web address].              6 SALTY THINGS TO AVOID     1,800MG  DAILY

## 2020-04-02 NOTE — Telephone Encounter (Signed)
error 

## 2020-04-03 ENCOUNTER — Ambulatory Visit: Payer: BC Managed Care – PPO | Admitting: Family Medicine

## 2020-04-03 ENCOUNTER — Encounter: Payer: Self-pay | Admitting: Family Medicine

## 2020-04-03 VITALS — BP 146/76 | HR 69 | Ht 60.0 in | Wt 145.2 lb

## 2020-04-03 DIAGNOSIS — G44209 Tension-type headache, unspecified, not intractable: Secondary | ICD-10-CM | POA: Diagnosis not present

## 2020-04-03 DIAGNOSIS — I150 Renovascular hypertension: Secondary | ICD-10-CM | POA: Diagnosis not present

## 2020-04-03 DIAGNOSIS — G459 Transient cerebral ischemic attack, unspecified: Secondary | ICD-10-CM

## 2020-04-03 NOTE — Patient Instructions (Signed)
Below is our plan:  We will continue topiramate 50mg  twice daily. Continue close follow up with PCP. Have them check CPE labs and consider TSH and A1C if dizziness does not continue to improve. Keep a close eye on your blood pressures. I am worried that this could have been why you are dizzy. We will consider repeating MRI if no improvement. Fall precautions advised. Try to avoid squatting position or have something to hold onto. Continue stroke prevention follow up. I am so proud of you for smoking cessation!!! Keep up the good work.   Please make sure you are staying well hydrated. I recommend 50-60 ounces daily. Well balanced diet and regular exercise encouraged.    Please continue follow up with care team as directed.   Follow up with me in 3 months   You may receive a survey regarding today's visit. I encourage you to leave honest feed back as I do use this information to improve patient care. Thank you for seeing me today!     Dizziness Dizziness is a common problem. It makes you feel unsteady or light-headed. You may feel like you are about to pass out (faint). Dizziness can lead to getting hurt if you stumble or fall. Dizziness can be caused by many things, including:  Medicines.  Not having enough water in your body (dehydration).  Illness. Follow these instructions at home: Eating and drinking  Drink enough fluid to keep your pee (urine) clear or pale yellow. This helps to keep you from getting dehydrated. Try to drink more clear fluids, such as water.  Do not drink alcohol.  Limit how much caffeine you drink or eat, if your doctor tells you to do that.  Limit how much salt (sodium) you drink or eat, if your doctor tells you to do that.   Activity  Avoid making quick movements. ? When you stand up from sitting in a chair, steady yourself until you feel okay. ? In the morning, first sit up on the side of the bed. When you feel okay, stand slowly while you hold onto  something. Do this until you know that your balance is fine.  If you need to stand in one place for a long time, move your legs often. Tighten and relax the muscles in your legs while you are standing.  Do not drive or use heavy machinery if you feel dizzy.  Avoid bending down if you feel dizzy. Place items in your home so you can reach them easily without leaning over.   Lifestyle  Do not use any products that contain nicotine or tobacco, such as cigarettes and e-cigarettes. If you need help quitting, ask your doctor.  Try to lower your stress level. You can do this by using methods such as yoga or meditation. Talk with your doctor if you need help. General instructions  Watch your dizziness for any changes.  Take over-the-counter and prescription medicines only as told by your doctor. Talk with your doctor if you think that you are dizzy because of a medicine that you are taking.  Tell a friend or a family member that you are feeling dizzy. If he or she notices any changes in your behavior, have this person call your doctor.  Keep all follow-up visits as told by your doctor. This is important. Contact a doctor if:  Your dizziness does not go away.  Your dizziness or light-headedness gets worse.  You feel sick to your stomach (nauseous).  You have trouble hearing.  You have new symptoms.  You are unsteady on your feet.  You feel like the room is spinning. Get help right away if:  You throw up (vomit) or have watery poop (diarrhea), and you cannot eat or drink anything.  You have trouble: ? Talking. ? Walking. ? Swallowing. ? Using your arms, hands, or legs.  You feel generally weak.  You are not thinking clearly, or you have trouble forming sentences. A friend or family member may notice this.  You have: ? Chest pain. ? Pain in your belly (abdomen). ? Shortness of breath. ? Sweating.  Your vision changes.  You are bleeding.  You have a very bad  headache.  You have neck pain or a stiff neck.  You have a fever. These symptoms may be an emergency. Do not wait to see if the symptoms will go away. Get medical help right away. Call your local emergency services (911 in the [place] Do not drive yourself to the hospital. Summary  Dizziness makes you feel unsteady or light-headed. You may feel like you are about to pass out (faint).  Drink enough fluid to keep your pee (urine) clear or pale yellow. Do not drink alcohol.  Avoid making quick movements if you feel dizzy.  Watch your dizziness for any changes. This information is not intended to replace advice given to you by your health care provider. Make sure you discuss any questions you have with your health care provider. Document Revised: 11/29/2019 Document Reviewed: 03/26/2016 Elsevier Patient Education  2021 Elsevier Inc.    Stroke Prevention Some medical conditions and lifestyle choices can lead to a higher risk for a stroke. You can help to prevent a stroke by eating healthy foods and exercising. It also helps to not smoke and to manage any health problems you may have. How can this condition affect me? A stroke is an emergency. It should be treated right away. A stroke can lead to brain damage or threaten your life. There is a better chance of surviving and getting better after a stroke if you get medical help right away. What can increase my risk? The following medical conditions may increase your risk of a stroke:  Diseases of the heart and blood vessels (cardiovascular disease).  High blood pressure (hypertension).  Diabetes.  High cholesterol.  Sickle cell disease.  Problems with blood clotting.  Being very overweight.  Sleeping problems (obstructivesleep apnea). Other risk factors include:  Being older than age 37.  A history of blood clots, stroke, or mini-stroke (TIA).  Race, ethnic background, or a family history of stroke.  Smoking or using  tobacco products.  Taking birth control pills, especially if you smoke.  Heavy alcohol and drug use.  Not being active. What actions can I take to prevent this? Manage your health conditions  High cholesterol. ? Eat a healthy diet. If this is not enough to manage your cholesterol, you may need to take medicines. ? Take medicines as told by your doctor.  High blood pressure. ? Try to keep your blood pressure below 130/80. ? If your blood pressure cannot be managed through a healthy diet and regular exercise, you may need to take medicines. ? Take medicines as told by your doctor. ? Ask your doctor if you should check your blood pressure at home. ? Have your blood pressure checked every year.  Diabetes. ? Eat a healthy diet and get regular exercise. If your blood sugar (glucose) cannot be managed through diet and exercise, you  may need to take medicines. ? Take medicines as told by your doctor.  Talk to your doctor about getting checked for sleeping problems. Signs of a problem can include: ? Snoring a lot. ? Feeling very tired.  Make sure that you manage any other conditions you have. Nutrition  Follow instructions from your doctor about what to eat or drink. You may be told to: ? Eat and drink fewer calories each day. ? Limit how much salt (sodium) you use to 1,500 milligrams (mg) each day. ? Use only healthy fats for cooking, such as olive oil, canola oil, and sunflower oil. ? Eat healthy foods. To do this:  Choose foods that are high in fiber. These include whole grains, and fresh fruits and vegetables.  Eat at least 5 servings of fruits and vegetables a day. Try to fill one-half of your plate with fruits and vegetables at each meal.  Choose low-fat (lean) proteins. These include low-fat cuts of meat, chicken without skin, fish, tofu, beans, and nuts.  Eat low-fat dairy products. ? Avoid foods that:  Are high in salt.  Have saturated fat.  Have trans fat.  Have  cholesterol.  Are processed or pre-made. ? Count how many carbohydrates you eat and drink each day.   Lifestyle  If you drink alcohol: ? Limit how much you have to:  0-1 drink a day for women who are not pregnant.  0-2 drinks a day for men. ? Know how much alcohol is in your drink. In the [place] one drink equals one 12 oz bottle of beer (339mL), one 5 oz glass of wine (136mL), or one 1 oz glass of hard liquor (81mL).  Do not smoke or use any products that have nicotine or tobacco. If you need help quitting, ask your doctor.  Avoid secondhand smoke.  Do not use drugs. Activity  Try to stay at a healthy weight.  Get at least 30 minutes of exercise on most days, such as: ? Fast walking. ? Biking. ? Swimming.   Medicines  Take over-the-counter and prescription medicines only as told by your doctor.  Avoid taking birth control pills. Talk to your doctor about the risks of taking birth control pills if: ? You are over 42 years old. ? You smoke. ? You get very bad headaches. ? You have had a blood clot. Where to find more information  American Stroke Association: www.strokeassociation.org Get help right away if:  You or a loved one has any signs of a stroke. "BE FAST" is an easy way to remember the warning signs: ? B - Balance. Dizziness, sudden trouble walking, or loss of balance. ? E - Eyes. Trouble seeing or a change in how you see. ? F - Face. Sudden weakness or loss of feeling of the face. The face or eyelid may droop on one side. ? A - Arms. Weakness or loss of feeling in an arm. This happens all of a sudden and most often on one side of the body. ? S - Speech. Sudden trouble speaking, slurred speech, or trouble understanding what people say. ? T - Time. Time to call emergency services. Write down what time symptoms started.  You or a loved one has other signs of a stroke, such as: ? A sudden, very bad headache with no known cause. ? Feeling like you may vomit  (nausea). ? Vomiting. ? A seizure. These symptoms may be an emergency. Get help right away. Call your local emergency services (911 in the  [place]  Do not wait to see if the symptoms will go away.  Do not drive yourself to the hospital. Summary  You can help to prevent a stroke by eating healthy, exercising, and not smoking. It also helps to manage any health problems you have.  Do not smoke or use any products that contain nicotine or tobacco.  Get help right away if you or a loved one has any signs of a stroke. This information is not intended to replace advice given to you by your health care provider. Make sure you discuss any questions you have with your health care provider. Document Revised: 10/09/2019 Document Reviewed: 10/09/2019 Elsevier Patient Education  [place].

## 2020-04-03 NOTE — Progress Notes (Signed)
Chief Complaint  Patient presents with  . Follow-up    RM 1 alone Pt is well, having some headaches and dizziness spells from time to time.      HISTORY OF PRESENT ILLNESS: Today 04/03/20  Sheri Perkins is a 60 y.o. female here today for follow up for headaches. She was last seen by Dr Leonie Man in 10/2018 when topiramate was increased to 50mg  BID. She reports that headaches are better. She may 8-10 headaches a month. She rarely has severe symptoms. Occasionally has nausea but feels this is triggered by smell of solvents used at work. She is having to wear a mask at work and feels this contributed. She alternates Tylenol and ibuprofen. She usually takes either 4 tablets of Tylenol or 4 tablets of ibuprofen everyday before work (5 days a week).   She reports that she has had more dizziness over the past two months. She has had 3 falls in the period of time. She had a event last week when she woke up around 2:30 to get ready for work. She raised out of the bed and stood up then felt dizzy. She had to grab a piece of furniture to keep from falling. Another episode was when she was sitting in the floor. She stood up and walked into another room and felt dizzy. Dizziness resolves after a few seconds. She reports that she can experience the dizziness when walking as well. She fell while coming down from a latter to the attack and missed the last step. The other two falls were when she was in a squatting and fell backwards. She reports recent issues with her blood pressures. Losartan was increased last year. BP had been "running high". She was at work on 12/30 and states that she "didn't feel right". She felt lightheaded. The nurse checked her blood pressure at work and reading was 85/57. She tried to stay at work but continued to feel unbalanced. She had to go home. She called her cardiologist and he reduced Losartan to 50mg . She has felt better since. She was seen for follow up with cardiology yesterday  and reports that reading were good.   She does have a history of TIA.  She continues regular follow up with PCP for stroke prevention and management of comorbidities. She continues aspirin 81mg  and atorvastatin 20mg  daily. She had stopped Plavix but recently advised to resume by cardiology. She reports last cigarette was 03/19/2020. She seems to be doing well with smoking cessation.    HISTORY (copied from previous note)  11/03/2018 PS: HPI: Initial visit 08/25/2018:Sheri Perkins is a 60 year old pleasant Caucasian lady seen today for initial office consultation visit for TIA and chronic headaches.  History is obtained from the patient and review of referral notes.  She states that sometime in January or February she had a brief episode of sudden onset of left face arm and leg tingling and numbness.  This lasted about 30 minutes.  She had a mild headache which was not disabling.  She did not have any significant weakness and was able to walk fine at that time.  She did not have any slurred speech.  She checked her blood pressure at that time it was fine.  She did not seek medical help right away.  She mentioned this several weeks later with a primary care visit.  She did see a cardiologist in March who ordered carotid ultrasound which I have personally reviewed showed no significant extracranial stenosis as well as  30-day heart monitor which was negative for significant arrhythmias.  Patient has not had any recurrent TIA or stroke symptoms.  She denies slurred speech, extremity weakness, gait or balance problems.  Review of her electronic medical records show that she had an MRI scan of the brain and MRA of the brain which was normal in 2006 at that time she had episode of dizziness lightheadedness and passing out.  The patient also complains of her chronic daily headaches which she is had for 2 years.  She describes a headache present mostly bifrontally dull in 6/10 in severity occasionally throbbing.  The  headaches are noticed during the day after going to work every day.  She has been taking now 4 tablets of Tylenol every day prior to going to work in the past she was taking 4 tablets of ibuprofen.  She has not tried any medication for headache prophylaxis.  She does complain of tightness of the muscles of her neck.  She does not do any regular neck stretching exercises.  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  She has no prior history of strokes or TIAs.  She does smoke 1 pack/day.  Update 11/03/2018 : She returns for follow-up after last visit 2 months ago.  She states her headaches are improved but they are still occurring 3 times a week or so.  They are much milder.  She takes Tylenol which helps.  She is tolerating Topamax 25 mg twice daily quite well without any side effects.  She is on aspirin 81 mg daily which is tolerating well.  Her blood pressure usually is better controlled today it is elevated in the office at 189/81.  She has no new complaints.  She did undergo MRI scan of the brain on 09/15/2018 which was unremarkable.  She admits to being under significant stress since she is bringing up 3 of her nieces children   REVIEW OF SYSTEMS: Out of a complete 14 system review of symptoms, the patient complains only of the following symptoms, and all other reviewed systems are negative.   ALLERGIES: Allergies  Allergen Reactions  . Macrobid [Nitrofurantoin Macrocrystal] Other (See Comments)    "burned lungs"  . Latex Itching       . Sulfa Antibiotics Hives and Rash     HOME MEDICATIONS: Outpatient Medications Prior to Visit  Medication Sig Dispense Refill  . aspirin EC 81 MG tablet Take 81 mg by mouth daily.    Marland Kitchen atorvastatin (LIPITOR) 20 MG tablet Take 1 tablet by mouth once daily 90 tablet 1  . Cholecalciferol 1.25 MG (50000 UT) TABS Take by  mouth.    . clopidogrel (PLAVIX) 75 MG tablet Take 1 tablet (75 mg total) by mouth daily. 90 tablet 3  . losartan (COZAAR) 50 MG tablet Take 1 tablet (50 mg total) by mouth daily. 90 tablet 3  . topiramate (TOPAMAX) 25 MG tablet Take 2 tablets (50 mg total) by mouth 2 (two) times daily. 120 tablet 2   No facility-administered medications prior to visit.     PAST MEDICAL HISTORY: Past Medical History:  Diagnosis Date  . Acute bronchiolitis   . Allergy   . Arthritis    fingers , back, neck   . Hyperlipidemia   . Renal artery stenosis ([facility]) 04/03/2011   right 1-59% proximal reduction,left main 1-59%  reduction,  . Renovascular hypertension   . TIA (transient ischemic attack)   . Tobacco abuse      PAST SURGICAL HISTORY: Past Surgical History:  Procedure Laterality Date  . arm surgery Bilateral   . bilateral carpal tunnel surgery    . CARPAL TUNNEL RELEASE Bilateral   . COLONOSCOPY    . FL INJ LEFT KNEE CT ARTHROGRAM ([facility] HX)    . HAND SURGERY    . KNEE SURGERY    . POLYPECTOMY    . SHOULDER SURGERY Left   . surgery to right thumb    . ulnar nerve relocation bilaterally    . US ECHOCARDIOGRAPHY  06/16/2010   normal     FAMILY HISTORY: Family History  Problem Relation Age of Onset  . Melanoma Sister   . Seizures Sister   . Colon cancer Neg Hx   . Colon polyps Neg Hx   . Rectal cancer Neg Hx   . Stomach cancer Neg Hx      SOCIAL HISTORY: Social History   Socioeconomic History  . Marital status: Married    Spouse name: Not on file  . Number of children: Not on file  . Years of education: Not on file  . Highest education level: Not on file  Occupational History  . Not on file  Tobacco Use  . Smoking status: Former Smoker    Packs/day: 0.12    Types: Cigarettes    Quit date: 04/03/2017    Years since quitting: 3.0  . Smokeless tobacco: Never Used  Substance and Sexual Activity  . Alcohol use: No    Alcohol/week: 0.0 standard drinks  . Drug use: No  .  Sexual activity: Not on file  Other Topics Concern  . Not on file  Social History Narrative  . Not on file   Social Determinants of Health   Financial Resource Strain: Not on file  Food Insecurity: Not on file  Transportation Needs: Not on file  Physical Activity: Not on file  Stress: Not on file  Social Connections: Not on file  Intimate Partner Violence: Not on file      PHYSICAL EXAM  Vitals:   04/03/20 1240  BP: (!) 146/76  Pulse: 69  Weight: 145 lb 3.2 oz (65.9 kg)  Height: 5' (1.524 m)   Body mass index is 28.36 kg/m.  Orthostatic VS for the past 24 hrs:  BP- Lying Pulse- Lying BP- Sitting Pulse- Sitting BP- Standing at 0 minutes Pulse- Standing at 0 minutes  04/03/20 1351 140/76 64 147/82 64 161/79 61     Generalized: Well developed, in no acute distress  Cardiology: normal rate and rhythm, no murmur auscultated  Respiratory: clear to auscultation bilaterally    Neurological examination  Mentation: Alert oriented to time, place, history taking. Follows all commands speech and language fluent Cranial nerve II-XII: Pupils were equal round reactive to light. Extraocular movements were full, visual field were full on confrontational test. Facial sensation and strength were normal. Uvula tongue midline. Head turning and shoulder shrug  were normal and symmetric. No nystagmus. Motor: The motor testing reveals 5 over 5 strength of all 4 extremities. Good symmetric motor tone is noted throughout.  Sensory: Sensory testing is intact to soft touch on all 4 extremities. No evidence of extinction is noted.  Coordination: Cerebellar testing reveals good finger-nose-finger and heel-to-shin bilaterally.  Gait and station: Gait is normal. Tandem gait is slightly unsteady but able to perform without assistance. Romberg negative.  Reflexes:  Deep tendon reflexes are symmetric and normal bilaterally.     DIAGNOSTIC DATA (LABS, IMAGING, TESTING) - I reviewed patient records,  labs, notes, testing and imaging myself where available.  Lab Results  Component Value Date   WBC 9.4 04/02/2017   HGB 11.1 (L) 04/02/2017   HCT 33.8 (L) 04/02/2017   MCV 90.9 04/02/2017   PLT 239 04/02/2017      Component Value Date/Time   NA 144 01/28/2018 0946   K 4.1 01/28/2018 0946   CL 104 01/28/2018 0946   CO2 25 01/28/2018 0946   GLUCOSE 86 01/28/2018 0946   GLUCOSE 102 (H) 04/01/2017 0841   BUN 11 01/28/2018 0946   CREATININE 0.67 01/28/2018 0946   CREATININE 0.64 05/10/2015 0910   CALCIUM 9.2 01/28/2018 0946   PROT 6.8 01/28/2018 0946   ALBUMIN 4.1 01/28/2018 0946   AST 24 01/28/2018 0946   ALT 22 01/28/2018 0946   ALKPHOS 105 01/28/2018 0946   BILITOT 0.4 01/28/2018 0946   GFRNONAA 97 01/28/2018 0946   GFRAA 112 01/28/2018 0946   Lab Results  Component Value Date   CHOL 126 08/25/2018   HDL 47 08/25/2018   LDLCALC 61 08/25/2018   TRIG 90 08/25/2018   CHOLHDL 2.7 08/25/2018   Lab Results  Component Value Date   HGBA1C 5.4 08/25/2018   No results found for: VITAMINB12 No results found for: TSH  No flowsheet data found.   ASSESSMENT AND PLAN  60 y.o. year old female  has a past medical history of Acute bronchiolitis, Allergy, Arthritis, Hyperlipidemia, Renal artery stenosis ([place]) (04/03/2011), Renovascular hypertension, TIA (transient ischemic attack), and Tobacco abuse. here with   Tension headache  TIA (transient ischemic attack)  Renovascular hypertension  Daney is doing very well from a headache perspective.  Although she does have 8-10 headache days per month, these are mild and very rarely debilitating.  She will continue topiramate 50 mg twice daily.  She denies migrainous symptoms.  We have discussed concerns of rebound headaches with regular use of Tylenol or ibuprofen.  She will let me know if headaches worsen.  I have discussed concerns of dizziness.  Neurological exam is intact.  No obvious concerns of TIA or strokelike symptoms.  She  has had some difficulty managing blood pressures recently.  Losartan was recently decreased from 100 to 50 mg daily due to low readings. Exam shows no orthostatic hypotension.  She is followed for renovascular hypertension.  I feel that dizziness is more consistent with varying blood pressure readings.  No obvious vertiginous symptoms.  I have asked that she document episodes of dizziness.  I have advised her to avoid making position changes quickly.  I have advised that she avoid positions that make her feel unbalanced.  I have encouraged her to follow-up closely with primary care for stroke prevention.  She will continue Plavix and aspirin as well as atorvastatin for stroke prevention.  I have offered to draw all labs today to assess for any other causes of dizziness.  She does report a previous history of hypothyroidism but has not been on Synthroid in many years.  No known history of diabetes.  She has an appointment with primary care next Wednesday and we will asked primary care to draw labs.  I will have her follow-up with me in 3 months, sooner if needed.  May consider additional work-up at that time if needed.  She verbalizes understanding and agreement with this plan.   No orders of the defined  types were placed in this encounter.     I spent 30 minutes of face-to-face and non-face-to-face time with patient.  This included previsit chart review, lab review, study review, order entry, electronic health record documentation, patient education.    Sheri Presto, MSN, FNP-C 04/03/2020, 2:17 PM  [facility] [address], [place], [place] [address] [phone]

## 2020-04-03 NOTE — Progress Notes (Signed)
I agree with the above plan 

## 2020-04-10 ENCOUNTER — Ambulatory Visit: Payer: BC Managed Care – PPO | Admitting: Cardiovascular Disease

## 2020-04-10 DIAGNOSIS — M79641 Pain in right hand: Secondary | ICD-10-CM | POA: Diagnosis not present

## 2020-04-10 DIAGNOSIS — M25541 Pain in joints of right hand: Secondary | ICD-10-CM | POA: Diagnosis not present

## 2020-04-10 DIAGNOSIS — M25542 Pain in joints of left hand: Secondary | ICD-10-CM | POA: Diagnosis not present

## 2020-04-10 DIAGNOSIS — N3091 Cystitis, unspecified with hematuria: Secondary | ICD-10-CM | POA: Diagnosis not present

## 2020-04-10 DIAGNOSIS — R5383 Other fatigue: Secondary | ICD-10-CM | POA: Diagnosis not present

## 2020-04-11 DIAGNOSIS — M50323 Other cervical disc degeneration at C6-C7 level: Secondary | ICD-10-CM | POA: Diagnosis not present

## 2020-04-11 DIAGNOSIS — M79642 Pain in left hand: Secondary | ICD-10-CM | POA: Diagnosis not present

## 2020-04-11 DIAGNOSIS — M25542 Pain in joints of left hand: Secondary | ICD-10-CM | POA: Diagnosis not present

## 2020-04-11 DIAGNOSIS — M79641 Pain in right hand: Secondary | ICD-10-CM | POA: Diagnosis not present

## 2020-04-11 DIAGNOSIS — M25541 Pain in joints of right hand: Secondary | ICD-10-CM | POA: Diagnosis not present

## 2020-04-11 DIAGNOSIS — M50322 Other cervical disc degeneration at C5-C6 level: Secondary | ICD-10-CM | POA: Diagnosis not present

## 2020-04-11 DIAGNOSIS — M5412 Radiculopathy, cervical region: Secondary | ICD-10-CM | POA: Diagnosis not present

## 2020-04-17 DIAGNOSIS — M50322 Other cervical disc degeneration at C5-C6 level: Secondary | ICD-10-CM | POA: Diagnosis not present

## 2020-04-17 DIAGNOSIS — M50323 Other cervical disc degeneration at C6-C7 level: Secondary | ICD-10-CM | POA: Diagnosis not present

## 2020-04-17 DIAGNOSIS — M5412 Radiculopathy, cervical region: Secondary | ICD-10-CM | POA: Diagnosis not present

## 2020-04-22 DIAGNOSIS — M5412 Radiculopathy, cervical region: Secondary | ICD-10-CM | POA: Diagnosis not present

## 2020-04-22 DIAGNOSIS — M50323 Other cervical disc degeneration at C6-C7 level: Secondary | ICD-10-CM | POA: Diagnosis not present

## 2020-04-22 DIAGNOSIS — M50322 Other cervical disc degeneration at C5-C6 level: Secondary | ICD-10-CM | POA: Diagnosis not present

## 2020-04-26 DIAGNOSIS — M5412 Radiculopathy, cervical region: Secondary | ICD-10-CM | POA: Diagnosis not present

## 2020-04-26 DIAGNOSIS — M50322 Other cervical disc degeneration at C5-C6 level: Secondary | ICD-10-CM | POA: Diagnosis not present

## 2020-04-26 DIAGNOSIS — M50323 Other cervical disc degeneration at C6-C7 level: Secondary | ICD-10-CM | POA: Diagnosis not present

## 2020-05-02 DIAGNOSIS — M50322 Other cervical disc degeneration at C5-C6 level: Secondary | ICD-10-CM | POA: Diagnosis not present

## 2020-05-02 DIAGNOSIS — M5412 Radiculopathy, cervical region: Secondary | ICD-10-CM | POA: Diagnosis not present

## 2020-05-02 DIAGNOSIS — M50323 Other cervical disc degeneration at C6-C7 level: Secondary | ICD-10-CM | POA: Diagnosis not present

## 2020-05-03 DIAGNOSIS — M2550 Pain in unspecified joint: Secondary | ICD-10-CM | POA: Diagnosis not present

## 2020-05-03 DIAGNOSIS — Z6827 Body mass index (BMI) 27.0-27.9, adult: Secondary | ICD-10-CM | POA: Diagnosis not present

## 2020-05-03 DIAGNOSIS — R5382 Chronic fatigue, unspecified: Secondary | ICD-10-CM | POA: Diagnosis not present

## 2020-05-03 DIAGNOSIS — R768 Other specified abnormal immunological findings in serum: Secondary | ICD-10-CM | POA: Diagnosis not present

## 2020-05-03 DIAGNOSIS — E663 Overweight: Secondary | ICD-10-CM | POA: Diagnosis not present

## 2020-05-13 DIAGNOSIS — M50323 Other cervical disc degeneration at C6-C7 level: Secondary | ICD-10-CM | POA: Diagnosis not present

## 2020-05-13 DIAGNOSIS — M50322 Other cervical disc degeneration at C5-C6 level: Secondary | ICD-10-CM | POA: Diagnosis not present

## 2020-05-13 DIAGNOSIS — M5412 Radiculopathy, cervical region: Secondary | ICD-10-CM | POA: Diagnosis not present

## 2020-05-21 DIAGNOSIS — M5412 Radiculopathy, cervical region: Secondary | ICD-10-CM | POA: Diagnosis not present

## 2020-05-21 DIAGNOSIS — M50323 Other cervical disc degeneration at C6-C7 level: Secondary | ICD-10-CM | POA: Diagnosis not present

## 2020-05-21 DIAGNOSIS — M50322 Other cervical disc degeneration at C5-C6 level: Secondary | ICD-10-CM | POA: Diagnosis not present

## 2020-05-27 DIAGNOSIS — M50322 Other cervical disc degeneration at C5-C6 level: Secondary | ICD-10-CM | POA: Diagnosis not present

## 2020-05-27 DIAGNOSIS — M5412 Radiculopathy, cervical region: Secondary | ICD-10-CM | POA: Diagnosis not present

## 2020-05-27 DIAGNOSIS — M50323 Other cervical disc degeneration at C6-C7 level: Secondary | ICD-10-CM | POA: Diagnosis not present

## 2020-05-29 DIAGNOSIS — M50322 Other cervical disc degeneration at C5-C6 level: Secondary | ICD-10-CM | POA: Diagnosis not present

## 2020-05-29 DIAGNOSIS — M50323 Other cervical disc degeneration at C6-C7 level: Secondary | ICD-10-CM | POA: Diagnosis not present

## 2020-05-29 DIAGNOSIS — M5412 Radiculopathy, cervical region: Secondary | ICD-10-CM | POA: Diagnosis not present

## 2020-06-09 ENCOUNTER — Emergency Department (HOSPITAL_COMMUNITY): Payer: BC Managed Care – PPO

## 2020-06-09 ENCOUNTER — Other Ambulatory Visit: Payer: Self-pay

## 2020-06-09 ENCOUNTER — Encounter (HOSPITAL_COMMUNITY): Payer: Self-pay

## 2020-06-09 ENCOUNTER — Inpatient Hospital Stay (HOSPITAL_COMMUNITY)
Admission: EM | Admit: 2020-06-09 | Discharge: 2020-06-13 | DRG: 872 | Disposition: A | Discharge status: Home / Self Care | Payer: BC Managed Care – PPO | Attending: Internal Medicine | Admitting: Internal Medicine

## 2020-06-09 DIAGNOSIS — Z91040 Latex allergy status: Secondary | ICD-10-CM

## 2020-06-09 DIAGNOSIS — Z6827 Body mass index (BMI) 27.0-27.9, adult: Secondary | ICD-10-CM

## 2020-06-09 DIAGNOSIS — Z7982 Long term (current) use of aspirin: Secondary | ICD-10-CM | POA: Diagnosis not present

## 2020-06-09 DIAGNOSIS — E663 Overweight: Secondary | ICD-10-CM | POA: Diagnosis present

## 2020-06-09 DIAGNOSIS — Z881 Allergy status to other antibiotic agents status: Secondary | ICD-10-CM | POA: Diagnosis not present

## 2020-06-09 DIAGNOSIS — L0390 Cellulitis, unspecified: Secondary | ICD-10-CM | POA: Diagnosis not present

## 2020-06-09 DIAGNOSIS — A419 Sepsis, unspecified organism: Principal | ICD-10-CM | POA: Diagnosis present

## 2020-06-09 DIAGNOSIS — D649 Anemia, unspecified: Secondary | ICD-10-CM | POA: Diagnosis not present

## 2020-06-09 DIAGNOSIS — Z882 Allergy status to sulfonamides status: Secondary | ICD-10-CM | POA: Diagnosis not present

## 2020-06-09 DIAGNOSIS — G44229 Chronic tension-type headache, not intractable: Secondary | ICD-10-CM | POA: Diagnosis present

## 2020-06-09 DIAGNOSIS — I150 Renovascular hypertension: Secondary | ICD-10-CM | POA: Diagnosis not present

## 2020-06-09 DIAGNOSIS — L0231 Cutaneous abscess of buttock: Secondary | ICD-10-CM | POA: Diagnosis not present

## 2020-06-09 DIAGNOSIS — D72825 Bandemia: Secondary | ICD-10-CM | POA: Diagnosis not present

## 2020-06-09 DIAGNOSIS — Z8673 Personal history of transient ischemic attack (TIA), and cerebral infarction without residual deficits: Secondary | ICD-10-CM | POA: Diagnosis not present

## 2020-06-09 DIAGNOSIS — I701 Atherosclerosis of renal artery: Secondary | ICD-10-CM | POA: Diagnosis present

## 2020-06-09 DIAGNOSIS — E876 Hypokalemia: Secondary | ICD-10-CM | POA: Diagnosis not present

## 2020-06-09 DIAGNOSIS — Z720 Tobacco use: Secondary | ICD-10-CM | POA: Diagnosis not present

## 2020-06-09 DIAGNOSIS — L03314 Cellulitis of groin: Secondary | ICD-10-CM | POA: Diagnosis not present

## 2020-06-09 DIAGNOSIS — Z79899 Other long term (current) drug therapy: Secondary | ICD-10-CM

## 2020-06-09 DIAGNOSIS — E7800 Pure hypercholesterolemia, unspecified: Secondary | ICD-10-CM | POA: Diagnosis not present

## 2020-06-09 DIAGNOSIS — E785 Hyperlipidemia, unspecified: Secondary | ICD-10-CM | POA: Diagnosis present

## 2020-06-09 DIAGNOSIS — J9600 Acute respiratory failure, unspecified whether with hypoxia or hypercapnia: Secondary | ICD-10-CM | POA: Diagnosis not present

## 2020-06-09 DIAGNOSIS — Z7902 Long term (current) use of antithrombotics/antiplatelets: Secondary | ICD-10-CM

## 2020-06-09 DIAGNOSIS — L03315 Cellulitis of perineum: Secondary | ICD-10-CM

## 2020-06-09 DIAGNOSIS — Z20822 Contact with and (suspected) exposure to covid-19: Secondary | ICD-10-CM | POA: Diagnosis not present

## 2020-06-09 DIAGNOSIS — L02215 Cutaneous abscess of perineum: Secondary | ICD-10-CM | POA: Diagnosis not present

## 2020-06-09 DIAGNOSIS — I10 Essential (primary) hypertension: Secondary | ICD-10-CM | POA: Diagnosis not present

## 2020-06-09 DIAGNOSIS — F17200 Nicotine dependence, unspecified, uncomplicated: Secondary | ICD-10-CM | POA: Diagnosis not present

## 2020-06-09 DIAGNOSIS — I700 Atherosclerosis of aorta: Secondary | ICD-10-CM | POA: Diagnosis not present

## 2020-06-09 DIAGNOSIS — L03317 Cellulitis of buttock: Secondary | ICD-10-CM | POA: Diagnosis not present

## 2020-06-09 LAB — RESP PANEL BY RT-PCR (FLU A&B, COVID) ARPGX2
Influenza A by PCR: NEGATIVE
Influenza B by PCR: NEGATIVE
SARS Coronavirus 2 by RT PCR: NEGATIVE

## 2020-06-09 LAB — BASIC METABOLIC PANEL
Anion gap: 10 (ref 5–15)
BUN: 12 mg/dL (ref 8–23)
CO2: 25 mmol/L (ref 22–32)
Calcium: 8.9 mg/dL (ref 8.9–10.3)
Chloride: 106 mmol/L (ref 98–111)
Creatinine, Ser: 0.86 mg/dL (ref 0.44–1.00)
GFR, Estimated: >60 mL/min (ref >60)
Glucose, Bld: 105 mg/dL — ABNORMAL HIGH (ref 70–99)
Potassium: 3.4 mmol/L — ABNORMAL LOW (ref 3.5–5.1)
Sodium: 141 mmol/L (ref 135–145)

## 2020-06-09 LAB — CBC WITH DIFFERENTIAL/PLATELET
Abs Immature Granulocytes: 0.29 10*3/uL — ABNORMAL HIGH (ref 0.00–0.07)
Basophils Absolute: 0.1 10*3/uL (ref 0.0–0.1)
Basophils Relative: 0 %
Eosinophils Absolute: 0.4 10*3/uL (ref 0.0–0.5)
Eosinophils Relative: 2 %
HCT: 40.7 % (ref 36.0–46.0)
Hemoglobin: 13.4 g/dL (ref 12.0–15.0)
Immature Granulocytes: 1 %
Lymphocytes Relative: 5 %
Lymphs Abs: 1.1 10*3/uL (ref 0.7–4.0)
MCH: 31.6 pg (ref 26.0–34.0)
MCHC: 32.9 g/dL (ref 30.0–36.0)
MCV: 96 fL (ref 80.0–100.0)
Monocytes Absolute: 1.4 10*3/uL — ABNORMAL HIGH (ref 0.1–1.0)
Monocytes Relative: 7 %
Neutro Abs: 17.1 10*3/uL — ABNORMAL HIGH (ref 1.7–7.7)
Neutrophils Relative %: 85 %
Platelets: 217 10*3/uL (ref 150–400)
RBC: 4.24 MIL/uL (ref 3.87–5.11)
RDW: 14.7 % (ref 11.5–15.5)
WBC: 20.4 10*3/uL — ABNORMAL HIGH (ref 4.0–10.5)
nRBC: 0 % (ref 0.0–0.2)

## 2020-06-09 LAB — LACTIC ACID, PLASMA
Lactic Acid, Venous: 1.2 mmol/L (ref 0.5–1.9)
Lactic Acid, Venous: 1.1 mmol/L (ref 0.5–1.9)

## 2020-06-09 MED ORDER — ACETAMINOPHEN 650 MG RE SUPP: 650.0000 mg | Freq: Four times a day (QID) | RECTAL | Status: DC | PRN

## 2020-06-09 MED ORDER — ENOXAPARIN SODIUM 40 MG/0.4ML ~~LOC~~ SOLN
40.0000 mg | SUBCUTANEOUS | Status: DC
  Administered 2020-06-10: 40 mg via SUBCUTANEOUS
  Filled 2020-06-09: qty 0.4

## 2020-06-09 MED ORDER — SODIUM CHLORIDE 0.9 % IV SOLN: Freq: Once | INTRAVENOUS | Status: AC

## 2020-06-09 MED ORDER — IOHEXOL 300 MG/ML  SOLN
100.0000 mL | Freq: Once | INTRAMUSCULAR | Status: AC | PRN
  Administered 2020-06-09: 100 mL via INTRAVENOUS

## 2020-06-09 MED ORDER — DICLOFENAC POTASSIUM 50 MG PO TABS: 50.0000 mg | ORAL_TABLET | Freq: Two times a day (BID) | ORAL | Status: DC

## 2020-06-09 MED ORDER — ACETAMINOPHEN 325 MG PO TABS: 650.0000 mg | ORAL_TABLET | Freq: Four times a day (QID) | ORAL | Status: DC | PRN

## 2020-06-09 MED ORDER — VANCOMYCIN HCL 1000 MG/200ML IV SOLN
1000.0000 mg | INTRAVENOUS | Status: DC
  Administered 2020-06-10: 1000 mg via INTRAVENOUS
  Filled 2020-06-09: qty 200

## 2020-06-09 MED ORDER — METRONIDAZOLE IN NACL 5-0.79 MG/ML-% IV SOLN
500.0000 mg | Freq: Once | INTRAVENOUS | Status: AC
  Administered 2020-06-09: 500 mg via INTRAVENOUS
  Filled 2020-06-09: qty 100

## 2020-06-09 MED ORDER — CLINDAMYCIN PHOSPHATE 600 MG/50ML IV SOLN
600.0000 mg | Freq: Once | INTRAVENOUS | Status: AC
  Administered 2020-06-09: 600 mg via INTRAVENOUS
  Filled 2020-06-09: qty 50

## 2020-06-09 MED ORDER — ONDANSETRON HCL 4 MG/2ML IJ SOLN: 4.0000 mg | Freq: Four times a day (QID) | INTRAMUSCULAR | Status: DC | PRN

## 2020-06-09 MED ORDER — CLOPIDOGREL BISULFATE 75 MG PO TABS
75.0000 mg | ORAL_TABLET | Freq: Every day | ORAL | Status: DC
  Administered 2020-06-10: 75 mg via ORAL
  Filled 2020-06-09: qty 1

## 2020-06-09 MED ORDER — LOSARTAN POTASSIUM 50 MG PO TABS
50.0000 mg | ORAL_TABLET | Freq: Every day | ORAL | Status: DC
  Administered 2020-06-10 – 2020-06-13 (×4): 50 mg via ORAL
  Filled 2020-06-09 (×4): qty 1

## 2020-06-09 MED ORDER — VANCOMYCIN HCL IN DEXTROSE 1-5 GM/200ML-% IV SOLN
1000.0000 mg | Freq: Once | INTRAVENOUS | Status: AC
  Administered 2020-06-09: 1000 mg via INTRAVENOUS
  Filled 2020-06-09: qty 200

## 2020-06-09 MED ORDER — ATORVASTATIN CALCIUM 20 MG PO TABS
20.0000 mg | ORAL_TABLET | Freq: Every day | ORAL | Status: DC
  Administered 2020-06-10 – 2020-06-13 (×4): 20 mg via ORAL
  Filled 2020-06-09 (×4): qty 1

## 2020-06-09 MED ORDER — FOLIC ACID 1 MG PO TABS
1.0000 mg | ORAL_TABLET | Freq: Every day | ORAL | Status: DC
  Administered 2020-06-10 – 2020-06-13 (×4): 1 mg via ORAL
  Filled 2020-06-09 (×4): qty 1

## 2020-06-09 MED ORDER — ASPIRIN EC 81 MG PO TBEC
81.0000 mg | DELAYED_RELEASE_TABLET | Freq: Every day | ORAL | Status: DC
  Administered 2020-06-10 – 2020-06-13 (×4): 81 mg via ORAL
  Filled 2020-06-09 (×4): qty 1

## 2020-06-09 MED ORDER — HYDROCODONE-ACETAMINOPHEN 5-325 MG PO TABS
1.0000 | ORAL_TABLET | ORAL | Status: DC | PRN
Start: 2020-06-09 — End: 2020-06-11
  Administered 2020-06-10 (×2): 1 via ORAL
  Administered 2020-06-11: 2 via ORAL
  Filled 2020-06-09: qty 1
  Filled 2020-06-09: qty 2
  Filled 2020-06-09: qty 1

## 2020-06-09 MED ORDER — PIPERACILLIN-TAZOBACTAM 3.375 G IVPB
3.3750 g | Freq: Three times a day (TID) | INTRAVENOUS | Status: DC
  Administered 2020-06-09 – 2020-06-10 (×2): 3.375 g via INTRAVENOUS
  Filled 2020-06-09 (×2): qty 50

## 2020-06-09 MED ORDER — SODIUM CHLORIDE 0.9 % IV SOLN
2.0000 g | Freq: Once | INTRAVENOUS | Status: AC
  Administered 2020-06-09: 2 g via INTRAVENOUS
  Filled 2020-06-09: qty 2

## 2020-06-09 MED ORDER — CLINDAMYCIN PHOSPHATE 600 MG/50ML IV SOLN: 600.0000 mg | Freq: Three times a day (TID) | INTRAVENOUS | Status: DC

## 2020-06-09 MED ORDER — ONDANSETRON HCL 4 MG PO TABS: 4.0000 mg | ORAL_TABLET | Freq: Four times a day (QID) | ORAL | Status: DC | PRN

## 2020-06-09 MED ORDER — CLINDAMYCIN PHOSPHATE 600 MG/50ML IV SOLN
600.0000 mg | Freq: Three times a day (TID) | INTRAVENOUS | Status: DC
  Administered 2020-06-10 (×2): 600 mg via INTRAVENOUS
  Filled 2020-06-09 (×3): qty 50

## 2020-06-09 MED ORDER — METHOTREXATE 2.5 MG PO TABS: 15.0000 mg | ORAL_TABLET | ORAL | Status: DC

## 2020-06-09 MED ORDER — DICLOFENAC SODIUM 50 MG PO TBEC
50.0000 mg | DELAYED_RELEASE_TABLET | Freq: Two times a day (BID) | ORAL | Status: DC
  Administered 2020-06-10 – 2020-06-13 (×6): 50 mg via ORAL
  Filled 2020-06-09 (×8): qty 1

## 2020-06-09 NOTE — ED Provider Notes (Cosign Needed Addendum)
[facility] DEPT Provider Note   CSN: [id number] Arrival date & time: 06/09/20  1937     History Chief Complaint  Patient presents with  . Abscess    Sheri Perkins is a 60 y.o. female who presents to the ED today with complaint of left vaginal abscess x 10 days. Pt reports it started as a small boil. She took a sitz bath however felt like it got larger afterwards. A friend of hers advised she put a potato on the area which she did and again it got larger prompting her to come to the ED today. She has never had a boil to this area in the past. She denies fevers or chills. Denies history of diabetes. Last A1c in 2020 5.4%. No other complaints at this time.   The history is provided by the patient and medical records.       Past Medical History:  Diagnosis Date  . Acute bronchiolitis   . Allergy   . Arthritis    fingers , back, neck   . Hyperlipidemia   . Renal artery stenosis (HCC) 04/03/2011   right 1-59% proximal reduction,left main 1-59% reduction,  . Renovascular hypertension   . TIA (transient ischemic attack)   . Tobacco abuse     Patient Active Problem List   Diagnosis Date Noted  . Cellulitis of perineum 06/09/2020  . Essential hypertension 01/17/2020  . Renal artery stenosis ([place]) 01/17/2020  . Head ache 10/06/2018  . History of TIA (transient ischemic attack) 01/26/2018  . Dyslipidemia 01/26/2018  . AKI (acute kidney injury) ([place]) 03/31/2017  . Sepsis ([place]) 03/31/2017  . Acute respiratory failure ([place]) 03/31/2017  . Leukocytosis 03/31/2017  . Respiratory distress 03/31/2017  . CAP (community acquired pneumonia) 03/31/2017  . Elevated transaminase level 03/31/2017  . Influenza with pneumonia 03/31/2017  . Neurocardiogenic syncope 07/27/2016  . Renovascular hypertension 08/12/2012  . Tobacco abuse 08/12/2012  . Hyperlipidemia 08/12/2012  . Encounter for long-term (current) use of other medications 08/12/2012  . CHANGE IN  BOWELS 12/23/2009  . PERSONAL HISTORY OF COLONIC POLYPS 12/23/2009    Past Surgical History:  Procedure Laterality Date  . arm surgery Bilateral   . bilateral carpal tunnel surgery    . CARPAL TUNNEL RELEASE Bilateral   . COLONOSCOPY    . FL INJ LEFT KNEE CT ARTHROGRAM ([facility] HX)    . HAND SURGERY    . KNEE SURGERY    . POLYPECTOMY    . SHOULDER SURGERY Left   . surgery to right thumb    . ulnar nerve relocation bilaterally    . US ECHOCARDIOGRAPHY  06/16/2010   normal     OB History   No obstetric history on file.     Family History  Problem Relation Age of Onset  . Melanoma Sister   . Seizures Sister   . Colon cancer Neg Hx   . Colon polyps Neg Hx   . Rectal cancer Neg Hx   . Stomach cancer Neg Hx     Social History   Tobacco Use  . Smoking status: Former Smoker    Packs/day: 0.12    Types: Cigarettes    Quit date: 04/03/2017    Years since quitting: 3.1  . Smokeless tobacco: Never Used  Substance Use Topics  . Alcohol use: No    Alcohol/week: 0.0 standard drinks  . Drug use: No    Home Medications Prior to Admission medications   Medication Sig Start Date End  Date Taking? Authorizing Provider  aspirin EC 81 MG tablet Take 81 mg by mouth daily.   Yes [provider]  atorvastatin (LIPITOR) 20 MG tablet Take 1 tablet by mouth once daily 03/07/19  Yes Croitoru, Mihai, MD  Cholecalciferol 1.25 MG (50000 UT) TABS Take by mouth.   Yes [provider]  clopidogrel (PLAVIX) 75 MG tablet Take 1 tablet (75 mg total) by mouth daily. 01/19/20  Yes Kilroy, Doreene Burke, PA-C  diclofenac (CATAFLAM) 50 MG tablet Take 50 mg by mouth 2 (two) times daily with a meal. As needed for pain and swelling 04/10/20  Yes [provider]  folic acid (FOLVITE) 1 MG tablet Take 1 mg by mouth daily. 06/02/20  Yes [provider]  losartan (COZAAR) 50 MG tablet Take 1 tablet (50 mg total) by mouth daily. 03/25/20 06/23/20 Yes Croitoru, Mihai, MD  methotrexate 2.5  MG tablet Take 15 mg by mouth once a week. Sundays 05/31/20  Yes [provider]  topiramate (TOPAMAX) 25 MG tablet Take 2 tablets (50 mg total) by mouth 2 (two) times daily. 11/03/18 01/17/20  Garvin Fila, MD    Allergies    Macrobid [nitrofurantoin macrocrystal], Latex, and Sulfa antibiotics  Review of Systems   Review of Systems  Constitutional: Negative for chills and fever.  Gastrointestinal: Negative for abdominal pain, nausea and vomiting.  Genitourinary: Negative for difficulty urinating, dysuria and flank pain.  Skin:       + abscess  All other systems reviewed and are negative.   Physical Exam Updated Vital Signs BP 131/70 (BP Location: Right Arm)   Pulse (!) 101   Temp 98.2 F (36.8 C) (Oral)   Resp 17   Ht 5' (1.524 m)   Wt 63.5 kg   SpO2 100%   BMI 27.34 kg/m   Physical Exam Vitals and nursing note reviewed.  Constitutional:      Appearance: She is not ill-appearing or diaphoretic.  HENT:     Head: Normocephalic and atraumatic.  Eyes:     Conjunctiva/sclera: Conjunctivae normal.  Cardiovascular:     Rate and Rhythm: Normal rate and regular rhythm.     Pulses: Normal pulses.  Pulmonary:     Effort: Pulmonary effort is normal.     Breath sounds: Normal breath sounds. No wheezing, rhonchi or rales.  Abdominal:     Palpations: Abdomen is soft.     Tenderness: There is no abdominal tenderness. There is no guarding or rebound.  Genitourinary:    Comments: Chaperone present for GU exam. Large area of erythema and induration noted to the entirety of the left labia majora extending into the perineum and left buttocks. No active drainage appreciated.  Musculoskeletal:     Cervical back: Neck supple.  Skin:    General: Skin is warm and dry.  Neurological:     Mental Status: She is alert.     ED Results / Procedures / Treatments   Labs (all labs ordered are listed, but only abnormal results are displayed) Labs Reviewed  BASIC METABOLIC PANEL -  Abnormal; Notable for the following components:      Result Value   Potassium 3.4 (*)    Glucose, Bld 105 (*)    All other components within normal limits  CBC WITH DIFFERENTIAL/PLATELET - Abnormal; Notable for the following components:   WBC 20.4 (*)    Neutro Abs 17.1 (*)    Monocytes Absolute 1.4 (*)    Abs Immature Granulocytes 0.29 (*)  All other components within normal limits  RESP PANEL BY RT-PCR (FLU A&B, COVID) ARPGX2  CULTURE, BLOOD (ROUTINE X 2)  CULTURE, BLOOD (ROUTINE X 2)  LACTIC ACID, PLASMA  LACTIC ACID, PLASMA    EKG None  Radiology CT PELVIS W CONTRAST  Result Date: 06/09/2020 CLINICAL DATA:  Focal cutaneous lesion/infection at the groin area. EXAM: CT PELVIS WITH CONTRAST TECHNIQUE: Multidetector CT imaging of the pelvis was performed using the standard protocol following the bolus administration of intravenous contrast. CONTRAST:  173mL OMNIPAQUE IOHEXOL 300 MG/ML  SOLN COMPARISON:  None. FINDINGS: Urinary Tract:  Unremarkable where included Bowel:  Unremarkable.  Normal appendix. Vascular/Lymphatic: Aortoiliac atherosclerotic vascular disease. 1.2 cm left inguinal lymph node on image 35 series 2, likely reactive. Reactive left external iliac lymph nodes. Reproductive: T-shaped IUD projects centrally over the uterus. Adnexa unremarkable. Other: Abnormal inflammatory stranding along the left pubis, left groin region, left labium majora, and left perineum extending to the level of the gluteal fold. Subtle asymmetric stranding also extends in the left medial thigh superiorly. No drainable abscess. No gas in the soft tissues. Musculoskeletal: No significant muscular involvement in the inflammatory process is identified. No hip effusion. IMPRESSION: 1. Abnormal inflammatory stranding along the left pubis, left groin region, left labium majora, and left perineum extending to the level of the gluteal fold and slightly into the left medial upper thigh. No drainable abscess. No  significant muscular involvement in the inflammatory process. No visible gas in the soft tissues. Appearance favors regional cellulitis. Please note that given the lack of subcutaneous gas and the patient being female, Fournier's gangrene is considered unlikely, but if the patient has special risk factors and/or rapid progression then close monitoring for fasciitis may be warranted. 2. Reactive left inguinal and external iliac lymph nodes. 3. Aortoiliac atherosclerotic vascular disease. 4. T-shaped IUD projects centrally within the uterus. 5. Aortic atherosclerosis. Aortic Atherosclerosis (ICD10-I70.0). Electronically Signed   By: Van Clines M.D.   On: 06/09/2020 14:31    Procedures .Critical Care Performed by: Eustaquio Maize, PA-C Authorized by: Eustaquio Maize, PA-C   Critical care provider statement:    Critical care time (minutes):  45   Critical care was time spent personally by me on the following activities:  Discussions with consultants, evaluation of patient's response to treatment, examination of patient, ordering and performing treatments and interventions, ordering and review of laboratory studies, ordering and review of radiographic studies, pulse oximetry, re-evaluation of patient's condition, obtaining history from patient or surrogate and review of old charts     Medications Ordered in ED Medications  vancomycin (VANCOCIN) IVPB 1000 mg/200 mL premix (1,000 mg Intravenous New Bag/Given 06/09/20 1519)  ceFEPIme (MAXIPIME) 2 g in sodium chloride 0.9 % 100 mL IVPB (0 g Intravenous Stopped 06/09/20 1357)  metroNIDAZOLE (FLAGYL) IVPB 500 mg (0 mg Intravenous Stopped 06/09/20 1517)  iohexol (OMNIPAQUE) 300 MG/ML solution 100 mL (100 mLs Intravenous Contrast Given 06/09/20 1336)    ED Course  I have reviewed the triage vital signs and the nursing notes.  Pertinent labs & imaging results that were available during my care of the patient were reviewed by me and considered in my  medical decision making (see chart for details).  Clinical Course as of 06/09/20 1528  Sun Jun 09, 2020  1222 WBC(!): 20.4 [MV]    Clinical Course User Index [MV] Eustaquio Maize, [place]   MDM Rules/Calculators/A&P  60 year old female presents to the ED today with complaint of vaginal abscess that began 10 days ago.  She initially was triaged as a left inner thigh abscess however with chaperone at bedside it does appear that she has a left labial majora abscess extends into the perineum as well as the left buttock.  Does appear quite extensive at this time.  There is no obvious fluctuance, all indurated.  Denies history of diabetes.  Does report she last had her A1c checked in 2020 which was normal at 5.4%.  On arrival to the ED patient is mildly tachycardic at 101 however nonfebrile and nontachypneic.  Appears to be in no acute distress.  She states her pain is minimal and she does not want pain medication.  Given extent of abscess I do feel patient needs CT scan at this time as well as labs.   CBC with a leukocytosis of 20,000 with a left shift.  Blood cultures have been added at this time.  Will cover for antibiotics with Vanco, cefepime, Flagyl.  Test ordered as patient may require surgical drainage of area.  CMP with a potassium of 3.4.  Glucose 104.  No other electrolyte abnormalities.  CT scan: IMPRESSION:  1. Abnormal inflammatory stranding along the left pubis, left groin  region, left labium majora, and left perineum extending to the level  of the gluteal fold and slightly into the left medial upper thigh.  No drainable abscess. No significant muscular involvement in the  inflammatory process. No visible gas in the soft tissues. Appearance  favors regional cellulitis. Please note that given the lack of  subcutaneous gas and the patient being female, Fournier's gangrene  is considered unlikely, but if the patient has special risk factors  and/or rapid  progression then close monitoring for fasciitis may be  warranted.  2. Reactive left inguinal and external iliac lymph nodes.  3. Aortoiliac atherosclerotic vascular disease.  4. T-shaped IUD projects centrally within the uterus.  5. Aortic atherosclerosis.   Given extent of cellulitis as well as concern for potential rapid progression into possible fourneir's will plan for admission at this time.   Discussed case with Dr. Marylyn Ishihara [facility] who agrees to accept patient for admission.   This note was prepared using Dragon voice recognition software and may include unintentional dictation errors due to the inherent limitations of voice recognition software.  Final Clinical Impression(s) / ED Diagnoses Final diagnoses:  Cellulitis of perineum    Rx / DC Orders ED Discharge Orders    None          Eustaquio Maize, PA-C 06/09/20 1548    Little, Wenda Overland, MD 06/12/20 1427

## 2020-06-09 NOTE — Progress Notes (Deleted)
History and Physical    Sheri Perkins YHC:[id number] DOB: [birth year] DOA: 06/09/2020  PCP: Enid Skeens., MD  Patient coming from: Home  Chief Complaint: groin pain  HPI: Sheri Perkins is a 60 y.o. female with medical history significant of HTN, TIA, Arthritis. Presenting with groin pain. She noticed a week ago that she had a small area of tenderness posterior to her left labia majora. She did not notice and vaginal discharge, discharge from that spot, or change in urination. She tried a sitz bath, but that did not help. Her family recommended that she rub it with a raw potato. That did not help. She tried to get to urgent care last night to be seen, but was unable to be seen. This morning when she woke with worsening swelling and pain, she decided to come to the ED. She denies any other aggravating or alleviating factors.     ED Course: CT was negative for fluid collection. Bedside US was negative for collection that was amenable to drainage. She was started on abx. [facility] was called for admission.   Review of Systems:  Denies CP, dyspnea, palpitations, ab pain, N/V/D, fevers. Review of systems is otherwise negative for all not mentioned in HPI.   PMHx Past Medical History:  Diagnosis Date  . Acute bronchiolitis   . Allergy   . Arthritis    fingers , back, neck   . Hyperlipidemia   . Renal artery stenosis (HCC) 04/03/2011   right 1-59% proximal reduction,left main 1-59% reduction,  . Renovascular hypertension   . TIA (transient ischemic attack)   . Tobacco abuse     PSHx Past Surgical History:  Procedure Laterality Date  . arm surgery Bilateral   . bilateral carpal tunnel surgery    . CARPAL TUNNEL RELEASE Bilateral   . COLONOSCOPY    . FL INJ LEFT KNEE CT ARTHROGRAM ([facility] HX)    . HAND SURGERY    . KNEE SURGERY    . POLYPECTOMY    . SHOULDER SURGERY Left   . surgery to right thumb    . ulnar nerve relocation bilaterally    . US ECHOCARDIOGRAPHY  06/16/2010    normal    SocHx  reports that she quit smoking about 3 years ago. Her smoking use included cigarettes. She smoked 0.12 packs per day. She has never used smokeless tobacco. She reports that she does not drink alcohol and does not use drugs.  Allergies  Allergen Reactions  . Macrobid [Nitrofurantoin Macrocrystal] Other (See Comments)    "burned lungs"  . Latex Itching       . Sulfa Antibiotics Hives and Rash    FamHx Family History  Problem Relation Age of Onset  . Melanoma Sister   . Seizures Sister   . Colon cancer Neg Hx   . Colon polyps Neg Hx   . Rectal cancer Neg Hx   . Stomach cancer Neg Hx     Prior to Admission medications   Medication Sig Start Date End Date Taking? Authorizing Provider  aspirin EC 81 MG tablet Take 81 mg by mouth daily.   Yes [provider]  atorvastatin (LIPITOR) 20 MG tablet Take 1 tablet by mouth once daily 03/07/19  Yes Croitoru, Mihai, MD  Cholecalciferol 1.25 MG (50000 UT) TABS Take by mouth.   Yes [provider]  clopidogrel (PLAVIX) 75 MG tablet Take 1 tablet (75 mg total) by mouth daily. 01/19/20  Yes Kilroy, Doreene Burke, PA-C  diclofenac (  CATAFLAM) 50 MG tablet Take 50 mg by mouth 2 (two) times daily with a meal. As needed for pain and swelling 04/10/20  Yes [provider]  folic acid (FOLVITE) 1 MG tablet Take 1 mg by mouth daily. 06/02/20  Yes [provider]  losartan (COZAAR) 50 MG tablet Take 1 tablet (50 mg total) by mouth daily. 03/25/20 06/23/20 Yes Croitoru, Mihai, MD  methotrexate 2.5 MG tablet Take 15 mg by mouth once a week. Sundays 05/31/20  Yes [provider]  topiramate (TOPAMAX) 25 MG tablet Take 2 tablets (50 mg total) by mouth 2 (two) times daily. 11/03/18 01/17/20  Garvin Fila, MD    Physical Exam: Vitals:   06/09/20 1230 06/09/20 1300 06/09/20 1330 06/09/20 1440  BP: 118/60 116/62 119/62 140/69  Pulse: 82 76 83 79  Resp: 19 18 19 19   Temp:      TempSrc:      SpO2: 95% 95%  96% 98%  Weight:      Height:        General: 60 y.o. female resting in bed in NAD Eyes: PERRL, normal sclera ENMT: Nares patent w/o discharge, orophaynx clear, dentition normal, ears w/o discharge/lesions/ulcers Neck: Supple, trachea midline Cardiovascular: RRR, +S1, S2, no m/g/r, equal pulses throughout Respiratory: CTABL, no w/r/r, normal WOB GI: BS+, NDNT, no masses noted, no organomegaly noted GU: cellulitis noted from left labia major to left gluteal fold, she has some surface fluid weeping that appears to be the remnant of a boil [exam performed with EDP at bedside as well] MSK: No e/c/c Skin: No rashes, bruises, ulcerations noted Neuro: A&O x 3, no focal deficits Psyc: Appropriate interaction and affect, calm/cooperative  Labs on Admission: I have personally reviewed following labs and imaging studies  CBC: Recent Labs  Lab 06/09/20 1149  WBC 20.4*  NEUTROABS 17.1*  HGB 13.4  HCT 40.7  MCV 96.0  PLT 627   Basic Metabolic Panel: Recent Labs  Lab 06/09/20 1149  NA 141  K 3.4*  CL 106  CO2 25  GLUCOSE 105*  BUN 12  CREATININE 0.86  CALCIUM 8.9   GFR: Estimated Creatinine Clearance: 57.2 mL/min (by C-G formula based on SCr of 0.86 mg/dL). Liver Function Tests: No results for input(s): AST, ALT, ALKPHOS, BILITOT, PROT, ALBUMIN in the last 168 hours. No results for input(s): LIPASE, AMYLASE in the last 168 hours. No results for input(s): AMMONIA in the last 168 hours. Coagulation Profile: No results for input(s): INR, PROTIME in the last 168 hours. Cardiac Enzymes: No results for input(s): CKTOTAL, CKMB, CKMBINDEX, TROPONINI in the last 168 hours. BNP (last 3 results) No results for input(s): PROBNP in the last 8760 hours. HbA1C: No results for input(s): HGBA1C in the last 72 hours. CBG: No results for input(s): GLUCAP in the last 168 hours. Lipid Profile: No results for input(s): CHOL, HDL, LDLCALC, TRIG, CHOLHDL, LDLDIRECT in the last 72  hours. Thyroid Function Tests: No results for input(s): TSH, T4TOTAL, FREET4, T3FREE, THYROIDAB in the last 72 hours. Anemia Panel: No results for input(s): VITAMINB12, FOLATE, FERRITIN, TIBC, IRON, RETICCTPCT in the last 72 hours. Urine analysis:    Component Value Date/Time   COLORURINE AMBER (A) 03/31/2017 1524   APPEARANCEUR HAZY (A) 03/31/2017 1524   LABSPEC 1.019 03/31/2017 1524   PHURINE 5.0 03/31/2017 1524   GLUCOSEU NEGATIVE 03/31/2017 1524   HGBUR LARGE (A) 03/31/2017 1524   BILIRUBINUR NEGATIVE 03/31/2017 1524   KETONESUR NEGATIVE 03/31/2017 1524   PROTEINUR 100 (A)  03/31/2017 1524   NITRITE POSITIVE (A) 03/31/2017 1524   LEUKOCYTESUR TRACE (A) 03/31/2017 1524    Radiological Exams on Admission: CT PELVIS W CONTRAST  Result Date: 06/09/2020 CLINICAL DATA:  Focal cutaneous lesion/infection at the groin area. EXAM: CT PELVIS WITH CONTRAST TECHNIQUE: Multidetector CT imaging of the pelvis was performed using the standard protocol following the bolus administration of intravenous contrast. CONTRAST:  152mL OMNIPAQUE IOHEXOL 300 MG/ML  SOLN COMPARISON:  None. FINDINGS: Urinary Tract:  Unremarkable where included Bowel:  Unremarkable.  Normal appendix. Vascular/Lymphatic: Aortoiliac atherosclerotic vascular disease. 1.2 cm left inguinal lymph node on image 35 series 2, likely reactive. Reactive left external iliac lymph nodes. Reproductive: T-shaped IUD projects centrally over the uterus. Adnexa unremarkable. Other: Abnormal inflammatory stranding along the left pubis, left groin region, left labium majora, and left perineum extending to the level of the gluteal fold. Subtle asymmetric stranding also extends in the left medial thigh superiorly. No drainable abscess. No gas in the soft tissues. Musculoskeletal: No significant muscular involvement in the inflammatory process is identified. No hip effusion. IMPRESSION: 1. Abnormal inflammatory stranding along the left pubis, left groin  region, left labium majora, and left perineum extending to the level of the gluteal fold and slightly into the left medial upper thigh. No drainable abscess. No significant muscular involvement in the inflammatory process. No visible gas in the soft tissues. Appearance favors regional cellulitis. Please note that given the lack of subcutaneous gas and the patient being female, Fournier's gangrene is considered unlikely, but if the patient has special risk factors and/or rapid progression then close monitoring for fasciitis may be warranted. 2. Reactive left inguinal and external iliac lymph nodes. 3. Aortoiliac atherosclerotic vascular disease. 4. T-shaped IUD projects centrally within the uterus. 5. Aortic atherosclerosis. Aortic Atherosclerosis (ICD10-I70.0). Electronically Signed   By: Van Clines M.D.   On: 06/09/2020 14:31   Assessment/Plan Perineum cellulitis     - admit to inpt, med-surg     - cellulitis noted from left labia major to left gluteal fold, she has some surface fluid weeping that appears to be the remnant of a boil     - vanc, zosyn, clinda per pharm consult and cellulitis orderset rec     - pain control, fluids  HTN     - continue home losartan  HLD     - continue home lipitor  Arthritis     - continue home methotrexate  Hypokalemia     - replace K+, check Mg2+  Chronic tension headaches Hx of TIA     - continue outpt follow up with neurology     - continue ASA, plavix     - does not appear to be on topamx any more  DVT prophylaxis: lovenox  Code Status: FULL  Family Communication: None at bedside  Consults called: None   Status is: Inpatient  Remains inpatient appropriate because:IV treatments appropriate due to intensity of illness or inability to take PO   Dispo: The patient is from: Home              Anticipated d/c is to: Home              Patient currently is not medically stable to d/c.   Difficult to place patient No  Jonnie Finner  DO [facility]  If 7PM-7AM, please contact night-coverage [email or web address]  06/09/2020, 3:27 PM

## 2020-06-09 NOTE — H&P (Signed)
History and Physical    [name] DTO:[id number] DOB: September 06, 1960 DOA: 06/09/2020  PCP: Sheri Skeens., MD  Patient coming from: Home  Chief Complaint: groin pain  HPI: Sheri Perkins is a 60 y.o. female with medical history significant of HTN, TIA, Arthritis. Presenting with groin pain. She noticed a week ago that she had a small area of tenderness posterior to her left labia majora. She did not notice and vaginal discharge, discharge from that spot, or change in urination. She tried a sitz bath, but that did not help. Her family recommended that she rub it with a raw potato. That did not help. She tried to get to urgent care last night to be seen, but was unable to be seen. This morning when she woke with worsening swelling and pain, she decided to come to the ED. She denies any other aggravating or alleviating factors.     ED Course: CT was negative for fluid collection. Bedside US was negative for collection that was amenable to drainage. She was started on abx. TRH was called for admission.   Review of Systems:  Denies CP, dyspnea, palpitations, ab pain, N/V/D, fevers. Review of systems is otherwise negative for all not mentioned in HPI.  PMHx Past Medical History:  Diagnosis Date  . Acute bronchiolitis   . Allergy   . Arthritis    fingers , back, neck   . Hyperlipidemia   . Renal artery stenosis (HCC) 04/03/2011   right 1-59% proximal reduction,left main 1-59% reduction,  . Renovascular hypertension   . TIA (transient ischemic attack)   . Tobacco abuse     PSHx Past Surgical History:  Procedure Laterality Date  . arm surgery Bilateral   . bilateral carpal tunnel surgery    . CARPAL TUNNEL RELEASE Bilateral   . COLONOSCOPY    . FL INJ LEFT KNEE CT ARTHROGRAM ([facility] HX)    . HAND SURGERY    . KNEE SURGERY    . POLYPECTOMY    . SHOULDER SURGERY Left   . surgery to right thumb    . ulnar nerve relocation bilaterally    . US ECHOCARDIOGRAPHY  06/16/2010    normal    SocHx  reports that she quit smoking about 3 years ago. Her smoking use included cigarettes. She smoked 0.12 packs per day. She has never used smokeless tobacco. She reports that she does not drink alcohol and does not use drugs.  Allergies  Allergen Reactions  . Macrobid [Nitrofurantoin Macrocrystal] Other (See Comments)    "burned lungs"  . Latex Itching       . Sulfa Antibiotics Hives and Rash    FamHx Family History  Problem Relation Age of Onset  . Melanoma Sister   . Seizures Sister   . Colon cancer Neg Hx   . Colon polyps Neg Hx   . Rectal cancer Neg Hx   . Stomach cancer Neg Hx     Prior to Admission medications   Medication Sig Start Date End Date Taking? Authorizing Provider  aspirin EC 81 MG tablet Take 81 mg by mouth daily.   Yes [provider]  atorvastatin (LIPITOR) 20 MG tablet Take 1 tablet by mouth once daily 03/07/19  Yes Croitoru, Mihai, MD  Cholecalciferol 1.25 MG (50000 UT) TABS Take by mouth.   Yes [provider]  clopidogrel (PLAVIX) 75 MG tablet Take 1 tablet (75 mg total) by mouth daily. 01/19/20  Yes Kilroy, Luke K, PA-C  diclofenac (CATAFLAM)  50 MG tablet Take 50 mg by mouth 2 (two) times daily with a meal. As needed for pain and swelling 04/10/20  Yes [provider]  folic acid (FOLVITE) 1 MG tablet Take 1 mg by mouth daily. 06/02/20  Yes [provider]  losartan (COZAAR) 50 MG tablet Take 1 tablet (50 mg total) by mouth daily. 03/25/20 06/23/20 Yes Croitoru, Mihai, MD  methotrexate 2.5 MG tablet Take 15 mg by mouth once a week. Sundays 05/31/20  Yes [provider]  topiramate (TOPAMAX) 25 MG tablet Take 2 tablets (50 mg total) by mouth 2 (two) times daily. 11/03/18 01/17/20  Garvin Fila, MD    Physical Exam: Vitals:   06/09/20 1300 06/09/20 1330 06/09/20 1440 06/09/20 1603  BP: 116/62 119/62 140/69   Pulse: 76 83 79   Resp: 18 19 19    Temp:    98.6 F (37 C)  TempSrc:    Oral  SpO2:  95% 96% 98%   Weight:      Height:        General: 60 y.o. female resting in bed in NAD Eyes: PERRL, normal sclera ENMT: Nares patent w/o discharge, orophaynx clear, dentition normal, ears w/o discharge/lesions/ulcers Neck: Supple, trachea midline Cardiovascular: RRR, +S1, S2, no m/g/r, equal pulses throughout Respiratory: CTABL, no w/r/r, normal WOB GI: BS+, NDNT, no masses noted, no organomegaly noted GU: cellulitis noted from left labia major to left gluteal fold, she has some surface fluid weeping that appears to be the remnant of a boil [exam performed with EDP at bedside as well] MSK: No e/c/c Skin: No rashes, bruises, ulcerations noted Neuro: A&O x 3, no focal deficits Psyc: Appropriate interaction and affect, calm/cooperative  Labs on Admission: I have personally reviewed following labs and imaging studies  CBC: Recent Labs  Lab 06/09/20 1149  WBC 20.4*  NEUTROABS 17.1*  HGB 13.4  HCT 40.7  MCV 96.0  PLT 361   Basic Metabolic Panel: Recent Labs  Lab 06/09/20 1149  NA 141  K 3.4*  CL 106  CO2 25  GLUCOSE 105*  BUN 12  CREATININE 0.86  CALCIUM 8.9   GFR: Estimated Creatinine Clearance: 57.2 mL/min (by C-G formula based on SCr of 0.86 mg/dL). Liver Function Tests: No results for input(s): AST, ALT, ALKPHOS, BILITOT, PROT, ALBUMIN in the last 168 hours. No results for input(s): LIPASE, AMYLASE in the last 168 hours. No results for input(s): AMMONIA in the last 168 hours. Coagulation Profile: No results for input(s): INR, PROTIME in the last 168 hours. Cardiac Enzymes: No results for input(s): CKTOTAL, CKMB, CKMBINDEX, TROPONINI in the last 168 hours. BNP (last 3 results) No results for input(s): PROBNP in the last 8760 hours. HbA1C: No results for input(s): HGBA1C in the last 72 hours. CBG: No results for input(s): GLUCAP in the last 168 hours. Lipid Profile: No results for input(s): CHOL, HDL, LDLCALC, TRIG, CHOLHDL, LDLDIRECT in the last 72  hours. Thyroid Function Tests: No results for input(s): TSH, T4TOTAL, FREET4, T3FREE, THYROIDAB in the last 72 hours. Anemia Panel: No results for input(s): VITAMINB12, FOLATE, FERRITIN, TIBC, IRON, RETICCTPCT in the last 72 hours. Urine analysis:    Component Value Date/Time   COLORURINE AMBER (A) 03/31/2017 1524   APPEARANCEUR HAZY (A) 03/31/2017 1524   LABSPEC 1.019 03/31/2017 1524   PHURINE 5.0 03/31/2017 1524   GLUCOSEU NEGATIVE 03/31/2017 1524   HGBUR LARGE (A) 03/31/2017 1524   BILIRUBINUR NEGATIVE 03/31/2017 1524   KETONESUR NEGATIVE 03/31/2017 1524   PROTEINUR  100 (A) 03/31/2017 1524   NITRITE POSITIVE (A) 03/31/2017 1524   LEUKOCYTESUR TRACE (A) 03/31/2017 1524    Radiological Exams on Admission: CT PELVIS W CONTRAST  Result Date: 06/09/2020 CLINICAL DATA:  Focal cutaneous lesion/infection at the groin area. EXAM: CT PELVIS WITH CONTRAST TECHNIQUE: Multidetector CT imaging of the pelvis was performed using the standard protocol following the bolus administration of intravenous contrast. CONTRAST:  172mL OMNIPAQUE IOHEXOL 300 MG/ML  SOLN COMPARISON:  None. FINDINGS: Urinary Tract:  Unremarkable where included Bowel:  Unremarkable.  Normal appendix. Vascular/Lymphatic: Aortoiliac atherosclerotic vascular disease. 1.2 cm left inguinal lymph node on image 35 series 2, likely reactive. Reactive left external iliac lymph nodes. Reproductive: T-shaped IUD projects centrally over the uterus. Adnexa unremarkable. Other: Abnormal inflammatory stranding along the left pubis, left groin region, left labium majora, and left perineum extending to the level of the gluteal fold. Subtle asymmetric stranding also extends in the left medial thigh superiorly. No drainable abscess. No gas in the soft tissues. Musculoskeletal: No significant muscular involvement in the inflammatory process is identified. No hip effusion. IMPRESSION: 1. Abnormal inflammatory stranding along the left pubis, left groin  region, left labium majora, and left perineum extending to the level of the gluteal fold and slightly into the left medial upper thigh. No drainable abscess. No significant muscular involvement in the inflammatory process. No visible gas in the soft tissues. Appearance favors regional cellulitis. Please note that given the lack of subcutaneous gas and the patient being female, Fournier's gangrene is considered unlikely, but if the patient has special risk factors and/or rapid progression then close monitoring for fasciitis may be warranted. 2. Reactive left inguinal and external iliac lymph nodes. 3. Aortoiliac atherosclerotic vascular disease. 4. T-shaped IUD projects centrally within the uterus. 5. Aortic atherosclerosis. Aortic Atherosclerosis (ICD10-I70.0). Electronically Signed   By: Van Clines M.D.   On: 06/09/2020 14:31    Assessment/Plan Perineum cellulitis     - admit to inpt, med-surg     - cellulitis noted from left labia major to left gluteal fold, she has some surface fluid weeping that appears to be the remnant of a boil     - vanc, zosyn, clinda per pharm consult and cellulitis orderset rec     - pain control, fluids  HTN     - continue home losartan  HLD     - continue home lipitor  Arthritis     - continue home methotrexate  Hypokalemia     - replace K+, check Mg2+  Chronic tension headaches Hx of TIA     - continue outpt follow up with neurology     - continue ASA, plavix     - does not appear to be on topamx any more  DVT prophylaxis: lovenox  Code Status: FULL  Family Communication: None at bedside  Consults called: None   Status is: Inpatient  Remains inpatient appropriate because:IV treatments appropriate due to intensity of illness or inability to take PO   Dispo: The patient is from: Home  Anticipated d/c is to: Home  Patient currently is not medically stable to d/c.              Difficult to place patient  No  Jonnie Finner DO [facility]  If 7PM-7AM, please contact night-coverage [email or web address]  06/09/2020, 5:03 PM

## 2020-06-09 NOTE — ED Notes (Signed)
Patient transported to CT 

## 2020-06-09 NOTE — ED Triage Notes (Signed)
Patient reports abscess on left inner thigh starting 10 days ago. Patient states pain is 5/10.

## 2020-06-09 NOTE — ED Notes (Signed)
ED TO INPATIENT HANDOFF REPORT  Name/Age/Gender Sheri Perkins 60 y.o. female  Code Status Code Status History    Date Active Date Inactive Code Status Order ID Comments User Context   03/31/2017 1558 04/02/2017 1556 Full Code [id number]  Radene Gunning, NP ED   Advance Care Planning Activity    Questions for Most Recent Historical Code Status (Order [id number])       Home/SNF/Other Home  Chief Complaint Cellulitis of perineum [R74.081]  Level of Care/Admitting Diagnosis ED Disposition    ED Disposition Condition [place]: [facility] [[id number]]  Level of Care: Med-Surg [16]  May admit patient to [facility] or [facility] if equivalent level of care is available:: No  Covid Evaluation: Confirmed COVID Negative  Diagnosis: Cellulitis of perineum [[id number]]  Admitting Physician: Jonnie Finner [[id number]]  Attending Physician: Jonnie Finner [[id number]]  Estimated length of stay: past midnight tomorrow  Certification:: I certify this patient will need inpatient services for at least 2 midnights       Medical History Past Medical History:  Diagnosis Date  . Acute bronchiolitis   . Allergy   . Arthritis    fingers , back, neck   . Hyperlipidemia   . Renal artery stenosis (HCC) 04/03/2011   right 1-59% proximal reduction,left main 1-59% reduction,  . Renovascular hypertension   . TIA (transient ischemic attack)   . Tobacco abuse     Allergies Allergies  Allergen Reactions  . Macrobid [Nitrofurantoin Macrocrystal] Other (See Comments)    "burned lungs"  . Latex Itching       . Sulfa Antibiotics Hives and Rash    IV Location/Drains/Wounds Patient Lines/Drains/Airways Status    Active Line/Drains/Airways    Name Placement date Placement time Site Days   Peripheral IV 03/31/17 Right Hand 03/31/17  1449  Hand  1166   Peripheral IV 06/09/20 Left Antecubital 06/09/20  1213  Antecubital  less than 1           Labs/Imaging Results for orders placed or performed during the hospital encounter of 06/09/20 (from the past 48 hour(s))  Basic metabolic panel     Status: Abnormal   Collection Time: 06/09/20 11:49 AM  Result Value Ref Range   Sodium 141 135 - 145 mmol/L   Potassium 3.4 (L) 3.5 - 5.1 mmol/L   Chloride 106 98 - 111 mmol/L   CO2 25 22 - 32 mmol/L   Glucose, Bld 105 (H) 70 - 99 mg/dL    Comment: Glucose reference range applies only to samples taken after fasting for at least 8 hours.   BUN 12 8 - 23 mg/dL   Creatinine, Ser 0.86 0.44 - 1.00 mg/dL   Calcium 8.9 8.9 - 10.3 mg/dL   GFR, Estimated >60 >60 mL/min    Comment: (NOTE) Calculated using the CKD-EPI Creatinine Equation (2021)    Anion gap 10 5 - 15    Comment: Performed at [facility], [place] [address] [place], [place] [address]  CBC with Differential     Status: Abnormal   Collection Time: 06/09/20 11:49 AM  Result Value Ref Range   WBC 20.4 (H) 4.0 - 10.5 K/uL   RBC 4.24 3.87 - 5.11 MIL/uL   Hemoglobin 13.4 12.0 - 15.0 g/dL   HCT 40.7 36.0 - 46.0 %   MCV 96.0 80.0 - 100.0 fL   MCH 31.6 26.0 - 34.0 pg   MCHC 32.9 30.0 - 36.0  g/dL   RDW 14.7 11.5 - 15.5 %   Platelets 217 150 - 400 K/uL   nRBC 0.0 0.0 - 0.2 %   Neutrophils Relative % 85 %   Neutro Abs 17.1 (H) 1.7 - 7.7 K/uL   Lymphocytes Relative 5 %   Lymphs Abs 1.1 0.7 - 4.0 K/uL   Monocytes Relative 7 %   Monocytes Absolute 1.4 (H) 0.1 - 1.0 K/uL   Eosinophils Relative 2 %   Eosinophils Absolute 0.4 0.0 - 0.5 K/uL   Basophils Relative 0 %   Basophils Absolute 0.1 0.0 - 0.1 K/uL   Immature Granulocytes 1 %   Abs Immature Granulocytes 0.29 (H) 0.00 - 0.07 K/uL    Comment: Performed at [facility], [place] [address], [place], [place] [address]  Lactic acid, plasma     Status: None   Collection Time: 06/09/20  1:00 PM  Result Value Ref Range   Lactic Acid, Venous 1.2 0.5 - 1.9 mmol/L    Comment: Performed at [facility], [place] [address] [place],  [address]  Resp Panel by RT-PCR (Flu A&B, Covid) Nasopharyngeal Swab     Status: None   Collection Time: 06/09/20  1:00 PM   Specimen: Nasopharyngeal Swab; Nasopharyngeal(NP) swabs in vial transport medium  Result Value Ref Range   SARS Coronavirus 2 by RT PCR NEGATIVE NEGATIVE    Comment: (NOTE) SARS-CoV-2 target nucleic acids are NOT DETECTED.  The SARS-CoV-2 RNA is generally detectable in upper respiratory specimens during the acute phase of infection. The lowest concentration of SARS-CoV-2 viral copies this assay can detect is 138 copies/mL. A negative result does not preclude SARS-Cov-2 infection and should not be used as the sole basis for treatment or other patient management decisions. A negative result may occur with  improper specimen collection/handling, submission of specimen other than nasopharyngeal swab, presence of viral mutation(s) within the areas targeted by this assay, and inadequate number of viral copies(<138 copies/mL). A negative result must be combined with clinical observations, patient history, and epidemiological information. The expected result is Negative.  Fact Sheet for Patients:  EntrepreneurPulse.com.au  Fact Sheet for Healthcare Providers:  IncredibleEmployment.be  This test is no t yet approved or cleared by the [place] FDA and  has been authorized for detection and/or diagnosis of SARS-CoV-2 by FDA under an Emergency Use Authorization (EUA). This EUA will remain  in effect (meaning this test can be used) for the duration of the COVID-19 declaration under Section 564(b)(1) of the Act, 21 U.S.C.section 360bbb-3(b)(1), unless the authorization is terminated  or revoked sooner.       Influenza A by PCR NEGATIVE NEGATIVE   Influenza B by PCR NEGATIVE NEGATIVE    Comment: (NOTE) The Xpert Xpress SARS-CoV-2/FLU/RSV plus assay is intended as an aid in  the diagnosis of influenza from Nasopharyngeal swab specimens and should not be used as a sole basis for treatment. Nasal washings and aspirates are unacceptable for Xpert Xpress SARS-CoV-2/FLU/RSV testing.  Fact Sheet for Patients: EntrepreneurPulse.com.au  Fact Sheet for Healthcare Providers: IncredibleEmployment.be  This test is not yet approved or cleared by the [place] FDA and has been authorized for detection and/or diagnosis of SARS-CoV-2 by FDA under an Emergency Use Authorization (EUA). This EUA will remain in effect (meaning this test can be used) for the duration of the COVID-19 declaration under Section 564(b)(1) of the Act, 21 U.S.C. section 360bbb-3(b)(1), unless the authorization is terminated or revoked.  Performed at [facility], [place] [address], [place], [place] [address]   Lactic acid, plasma     Status: None   Collection Time: 06/09/20  2:23 PM  Result Value Ref Range   Lactic Acid, Venous 1.1 0.5 - 1.9 mmol/L    Comment: Performed at [facility], [place] [address], [place], [place] [address]   CT PELVIS W CONTRAST  Result Date: 06/09/2020 CLINICAL DATA:  Focal cutaneous lesion/infection at the groin area. EXAM: CT PELVIS WITH CONTRAST TECHNIQUE: Multidetector CT imaging of the pelvis was performed using the standard protocol following the bolus administration of intravenous contrast. CONTRAST:  130mL OMNIPAQUE IOHEXOL 300 MG/ML  SOLN COMPARISON:  None. FINDINGS: Urinary Tract:  Unremarkable where included Bowel:  Unremarkable.  Normal appendix. Vascular/Lymphatic: Aortoiliac atherosclerotic vascular disease. 1.2 cm left inguinal lymph node on image 35 series 2, likely reactive. Reactive left external iliac lymph nodes. Reproductive: T-shaped IUD projects centrally over the uterus. Adnexa unremarkable. Other: Abnormal inflammatory stranding along the left pubis, left groin region, left  labium majora, and left perineum extending to the level of the gluteal fold. Subtle asymmetric stranding also extends in the left medial thigh superiorly. No drainable abscess. No gas in the soft tissues. Musculoskeletal: No significant muscular involvement in the inflammatory process is identified. No hip effusion. IMPRESSION: 1. Abnormal inflammatory stranding along the left pubis, left groin region, left labium majora, and left perineum extending to the level of the gluteal fold and slightly into the left medial upper thigh. No drainable abscess. No significant muscular involvement in the inflammatory process. No visible gas in the soft tissues. Appearance favors regional cellulitis. Please note that given the lack of subcutaneous gas and the patient being female, Fournier's gangrene is considered unlikely, but if the patient has special risk factors and/or rapid progression then close monitoring for fasciitis may be warranted. 2. Reactive left inguinal and external iliac lymph nodes. 3. Aortoiliac atherosclerotic vascular disease. 4. T-shaped IUD projects centrally within the uterus. 5. Aortic atherosclerosis. Aortic Atherosclerosis (ICD10-I70.0). Electronically Signed   By: Van Clines M.D.   On: 06/09/2020 14:31    Pending Labs Unresulted Labs (From admission, onward)          Start     Ordered   06/09/20 1223  Culture, blood (routine x 2)  BLOOD CULTURE X 2,   STAT      06/09/20 1236          Vitals/Pain Today's Vitals   06/09/20 1300 06/09/20 1330 06/09/20 1440 06/09/20 1603  BP: 116/62 119/62 140/69   Pulse: 76 83 79   Resp: 18 19 19    Temp:    98.6 F (37 C)  TempSrc:    Oral  SpO2: 95% 96% 98%   Weight:      Height:      PainSc:        Isolation Precautions No active isolations  Medications Medications  vancomycin (VANCOCIN) IVPB 1000 mg/200 mL premix (1,000 mg Intravenous New Bag/Given 06/09/20 1519)  ceFEPIme (MAXIPIME) 2 g in sodium chloride 0.9 % 100 mL IVPB  (0 g Intravenous Stopped 06/09/20 1357)  metroNIDAZOLE (FLAGYL) IVPB 500 mg (0 mg Intravenous Stopped 06/09/20 1517)  iohexol (OMNIPAQUE) 300 MG/ML solution 100 mL (100 mLs Intravenous Contrast Given 06/09/20 1336)    Mobility ambulatory

## 2020-06-09 NOTE — Progress Notes (Signed)
Pharmacy Antibiotic Note  JALIA ZUNIGA is a 60 y.o. female admitted on 06/09/2020 with perineum cellulitis.  Pharmacy has been consulted for vancomycin & zosyn & clindamycin dosing. CT pelvis/abd: abnormal inflammatory stranding: No drainable abscess, no visible gas in soft tissues, appearance favors cellulitis  Plan: Clindamycin 600 mg IV q8 Zosyn 3.375 gm IV q8h, infuse each dose over 4 hours Vancomycin 1 gm given in ED @ 1510 Vancomycin 1 gm IV q24 for AUC 482 33/11.6 using SCr 0.86, Vd 0.72. will start 3/21 at 10 am due to no loading dose Daily SCr while on both vancomycin & zosyn Vancomycin levels as needed, anticipate short course  Height: 5' (152.4 cm) Weight: 63.5 kg (140 lb) IBW/kg (Calculated) : 45.5  Temp (24hrs), Avg:98.4 F (36.9 C), Min:98.2 F (36.8 C), Max:98.6 F (37 C)  Recent Labs  Lab 06/09/20 1149 06/09/20 1300 06/09/20 1423  WBC 20.4*  --   --   CREATININE 0.86  --   --   LATICACIDVEN  --  1.2 1.1    Estimated Creatinine Clearance: 57.2 mL/min (by C-G formula based on SCr of 0.86 mg/dL).    Allergies  Allergen Reactions  . Macrobid [Nitrofurantoin Macrocrystal] Other (See Comments)    "burned lungs"  . Latex Itching       . Sulfa Antibiotics Hives and Rash  Antimicrobials this admission:  3/20 cefepime x 1 3/20 flagyl x 1 3/20 vanc>> 3/20 zosyn>> 3/20 clinda>> Dose adjustments this admission:   Microbiology results:  3/20 flu/covid - 3/20 BCx2: sent  Thank you for allowing pharmacy to be a part of this patient's care.  Eudelia Bunch, Pharm.D 06/09/2020 4:37 PM

## 2020-06-09 NOTE — Progress Notes (Signed)
Methotrexate (Trexall; Rheumatrex) hold criteria  Hgb < 8  WBC < 3  Pltc < 100K  SCr > 1.5x baseline (or > 2 if baseline unknown)  AST or ALT >3x ULN  Bili > 1.5x ULN  Ascites or pleural effusion  Diarrhea - Grade 2 or higher  Ulcerative stomatitis  Unexplained pneumonitis / hypoxemia  Active infection    Methotrexate on hold for active infection  Eudelia Bunch, Pharm.D 06/09/2020 5:03 PM

## 2020-06-10 DIAGNOSIS — F17200 Nicotine dependence, unspecified, uncomplicated: Secondary | ICD-10-CM

## 2020-06-10 DIAGNOSIS — E876 Hypokalemia: Secondary | ICD-10-CM

## 2020-06-10 DIAGNOSIS — L0390 Cellulitis, unspecified: Secondary | ICD-10-CM

## 2020-06-10 DIAGNOSIS — Z8673 Personal history of transient ischemic attack (TIA), and cerebral infarction without residual deficits: Secondary | ICD-10-CM

## 2020-06-10 DIAGNOSIS — A419 Sepsis, unspecified organism: Principal | ICD-10-CM

## 2020-06-10 DIAGNOSIS — D72825 Bandemia: Secondary | ICD-10-CM

## 2020-06-10 DIAGNOSIS — I10 Essential (primary) hypertension: Secondary | ICD-10-CM

## 2020-06-10 LAB — CBC
HCT: 35.8 % — ABNORMAL LOW (ref 36.0–46.0)
Hemoglobin: 11.7 g/dL — ABNORMAL LOW (ref 12.0–15.0)
MCH: 31.2 pg (ref 26.0–34.0)
MCHC: 32.7 g/dL (ref 30.0–36.0)
MCV: 95.5 fL (ref 80.0–100.0)
Platelets: 220 10*3/uL (ref 150–400)
RBC: 3.75 MIL/uL — ABNORMAL LOW (ref 3.87–5.11)
RDW: 14.7 % (ref 11.5–15.5)
WBC: 12.5 10*3/uL — ABNORMAL HIGH (ref 4.0–10.5)
nRBC: 0 % (ref 0.0–0.2)

## 2020-06-10 LAB — COMPREHENSIVE METABOLIC PANEL
ALT: 14 U/L (ref 0–44)
AST: 10 U/L — ABNORMAL LOW (ref 15–41)
Albumin: 3 g/dL — ABNORMAL LOW (ref 3.5–5.0)
Alkaline Phosphatase: 90 U/L (ref 38–126)
Anion gap: 9 (ref 5–15)
BUN: 12 mg/dL (ref 8–23)
CO2: 23 mmol/L (ref 22–32)
Calcium: 8.3 mg/dL — ABNORMAL LOW (ref 8.9–10.3)
Chloride: 108 mmol/L (ref 98–111)
Creatinine, Ser: 0.78 mg/dL (ref 0.44–1.00)
GFR, Estimated: >60 mL/min (ref >60)
Glucose, Bld: 96 mg/dL (ref 70–99)
Potassium: 3 mmol/L — ABNORMAL LOW (ref 3.5–5.1)
Sodium: 140 mmol/L (ref 135–145)
Total Bilirubin: 1.2 mg/dL (ref 0.3–1.2)
Total Protein: 6.1 g/dL — ABNORMAL LOW (ref 6.5–8.1)

## 2020-06-10 LAB — HIV ANTIBODY (ROUTINE TESTING W REFLEX): HIV Screen 4th Generation wRfx: NONREACTIVE

## 2020-06-10 LAB — MAGNESIUM: Magnesium: 2 mg/dL (ref 1.7–2.4)

## 2020-06-10 MED ORDER — GUAIFENESIN-DM 100-10 MG/5ML PO SYRP
5.0000 mL | ORAL_SOLUTION | ORAL | Status: DC | PRN
  Administered 2020-06-10: 5 mL via ORAL
  Filled 2020-06-10: qty 10

## 2020-06-10 MED ORDER — SODIUM CHLORIDE 0.9 % IV SOLN
3.0000 g | Freq: Four times a day (QID) | INTRAVENOUS | Status: DC
  Administered 2020-06-10 – 2020-06-11 (×4): 3 g via INTRAVENOUS
  Filled 2020-06-10 (×4): qty 3
  Filled 2020-06-10: qty 8

## 2020-06-10 MED ORDER — POTASSIUM CHLORIDE CRYS ER 20 MEQ PO TBCR
40.0000 meq | EXTENDED_RELEASE_TABLET | ORAL | Status: AC
  Administered 2020-06-10 (×2): 40 meq via ORAL
  Filled 2020-06-10 (×2): qty 2

## 2020-06-10 NOTE — Progress Notes (Signed)
PROGRESS NOTE  HALY FEHER SWN:[id number] DOB: Jul 20, 1960   PCP: Enid Skeens., MD  Patient is from: Home.  Lives with husband.  Independently ambulates at baseline.  DOA: 06/09/2020 LOS: 1  Chief complaints: Groin pain  Brief Narrative / Interim history: 60 year old F with PMH of TIA, HTN, osteoarthritis and tobacco use disorder presenting with pain and swelling in left perineal area involving labia majora for about a week and admitted for perineal cellulitis.  No vaginal discharge or UTI symptoms.  Reportedly up-to-date on age-appropriate cancer screening including Pap smear and colonoscopy.  Monogamous with her husband.  Bedside US and pelvic CT concerning for cellulitis involving the left pelvis, left groin, left labia majora and left perineum extending into left of the gluteal fold and left medial upper thigh with reactive inguinal and external iliac LAD but no abscess or gas. Cultures obtained.  Started on vancomycin and cefepime, and admitted.   Subjective: Seen and examined earlier this morning.  No major events overnight of this morning.  No complaint this morning.  She denies pain, UTI symptoms, vaginal discharge, chest pain, dyspnea, nausea, vomiting or abdominal pain.  Has not a bowel movement in 2 days but does not feel constipated.  Objective: Vitals:   06/09/20 1745 06/09/20 2041 06/10/20 0612 06/10/20 0934  BP: 113/70 123/68 126/64 (!) 123/56  Pulse: 80 81 76 67  Resp: 18 18 18 17   Temp: 98.5 F (36.9 C) 98.2 F (36.8 C) 98.7 F (37.1 C) 97.8 F (36.6 C)  TempSrc: Oral Oral Oral Oral  SpO2: 97% 96% 96% 97%  Weight:      Height:        Intake/Output Summary (Last 24 hours) at 06/10/2020 1156 Last data filed at 06/10/2020 1151 Gross per 24 hour  Intake 926.42 ml  Output 0 ml  Net 926.42 ml   Filed Weights   06/09/20 0902  Weight: 63.5 kg    Examination:  GENERAL: No apparent distress.  Nontoxic. HEENT: MMM.  Vision and hearing grossly intact.   NECK: Supple.  No apparent JVD.  RESP:  No IWOB.  Fair aeration bilaterally. CVS:  RRR. Heart sounds normal.  ABD/GI/GU: BS+. Abd soft, NTND. Erythema with tenderness about left perineal area. Some epidermal sloughing just posterior to left labia. No fluctuance.  No lymphadenopathy.  Full range of motion in both hips.  MSK/EXT:  Moves extremities. No apparent deformity. No edema.  SKIN: As above. NEURO: Awake, alert and oriented appropriately.  No apparent focal neuro deficit. PSYCH: Calm. Normal affect.   Sidney Ace, RN present as chaperone during pelvic exam.  Procedures:  None  Microbiology summarized: COVID-19 and influenza PCR nonreactive. Blood cultures NGTD.  Assessment & Plan: Sepsis due to extensive perineal cellulitis without abscess or gas: POA.  She had significant leukocytosis and tachycardia with known source of infection on presentation.  No evidence of endorgan damage.  Blood cultures NGTD.  CT without abscess or gas.  Very low suspicion for neck fracture.  Clinically improving.  Reportedly up-to-date on age-appropriate cancer screening including Pap smear and colonoscopy. -De-escalate antibiotic to Unasyn -Continue monitoring -Doubt need for surgical consultation at this time.  Essential hypertension: Normotensive. -Continue home losartan  TIA?/hyperlipidemia: TIA seems to be remote. -Continue statin but not sure if this indicated -Discontinue aspirin and Plavix. No indication for this  Hypokalemia: Likely due to IV fluid -Replenish and recheck  Leukocytosis/bandemia: Likely due to #1.  Improving. -Continue monitoring  Normocytic anemia: Drop likely dilutional from IV  fluids Recent Labs    06/09/20 1149 06/10/20 0623  HGB 13.4 11.7*  -Monitor  Tobacco use disorder: Smokes about 2 packs a day for 40 years, and cut down to about 10 cigs/day about 3 months ago. -Strongly encouraged to quit smoking cigarettes -She declined nicotine  patch  Overweight Body mass index is 27.34 kg/m.         DVT prophylaxis:  enoxaparin (LOVENOX) injection 40 mg Start: 06/09/20 1800  Code Status: Full code Family Communication: Updated patient's husband at bedside. Level of care: Med-Surg Status is: Inpatient  Remains inpatient appropriate because:Persistent severe electrolyte disturbances, IV treatments appropriate due to intensity of illness or inability to take PO and Inpatient level of care appropriate due to severity of illness   Dispo: The patient is from: Home              Anticipated d/c is to: Home              Patient currently is not medically stable to d/c.   Difficult to place patient No       Consultants:  None   Sch Meds:  Scheduled Meds: . aspirin EC  81 mg Oral Daily  . atorvastatin  20 mg Oral Daily  . diclofenac  50 mg Oral BID WC  . enoxaparin (LOVENOX) injection  40 mg Subcutaneous Q24H  . folic acid  1 mg Oral Daily  . losartan  50 mg Oral Daily  . potassium chloride  40 mEq Oral Q4H   Continuous Infusions: . ampicillin-sulbactam (UNASYN) IV     PRN Meds:.acetaminophen **OR** acetaminophen, guaiFENesin-dextromethorphan, HYDROcodone-acetaminophen, ondansetron **OR** ondansetron (ZOFRAN) IV  Antimicrobials: Anti-infectives (From admission, onward)   Start     Dose/Rate Route Frequency Ordered Stop   06/10/20 1400  Ampicillin-Sulbactam (UNASYN) 3 g in sodium chloride 0.9 % 100 mL IVPB        3 g 200 mL/hr over 30 Minutes Intravenous Every 6 hours 06/10/20 1153     06/10/20 1000  vancomycin (VANCOREADY) IVPB 1000 mg/200 mL  Status:  Discontinued        1,000 mg 200 mL/hr over 60 Minutes Intravenous Every 24 hours 06/09/20 1630 06/10/20 1150   06/10/20 0000  clindamycin (CLEOCIN) IVPB 600 mg  Status:  Discontinued        600 mg 100 mL/hr over 30 Minutes Intravenous Every 8 hours 06/09/20 1622 06/10/20 1150   06/09/20 2200  clindamycin (CLEOCIN) IVPB 600 mg  Status:  Discontinued         600 mg 100 mL/hr over 30 Minutes Intravenous Every 8 hours 06/09/20 1704 06/09/20 1712   06/09/20 2200  piperacillin-tazobactam (ZOSYN) IVPB 3.375 g  Status:  Discontinued        3.375 g 12.5 mL/hr over 240 Minutes Intravenous Every 8 hours 06/09/20 1623 06/10/20 1150   06/09/20 1630  clindamycin (CLEOCIN) IVPB 600 mg        600 mg 100 mL/hr over 30 Minutes Intravenous  Once 06/09/20 1622 06/09/20 1836   06/09/20 1245  vancomycin (VANCOCIN) IVPB 1000 mg/200 mL premix        1,000 mg 200 mL/hr over 60 Minutes Intravenous  Once 06/09/20 1236 06/09/20 1620   06/09/20 1245  ceFEPIme (MAXIPIME) 2 g in sodium chloride 0.9 % 100 mL IVPB        2 g 200 mL/hr over 30 Minutes Intravenous  Once 06/09/20 1236 06/09/20 1357   06/09/20 1245  metroNIDAZOLE (FLAGYL) IVPB 500 mg  500 mg 100 mL/hr over 60 Minutes Intravenous  Once 06/09/20 1236 06/09/20 1517       I have personally reviewed the following labs and images: CBC: Recent Labs  Lab 06/09/20 1149 06/10/20 0623  WBC 20.4* 12.5*  NEUTROABS 17.1*  --   HGB 13.4 11.7*  HCT 40.7 35.8*  MCV 96.0 95.5  PLT 217 220   BMP &GFR Recent Labs  Lab 06/09/20 1149 06/10/20 0623  NA 141 140  K 3.4* 3.0*  CL 106 108  CO2 25 23  GLUCOSE 105* 96  BUN 12 12  CREATININE 0.86 0.78  CALCIUM 8.9 8.3*  MG  --  2.0   Estimated Creatinine Clearance: 61.4 mL/min (by C-G formula based on SCr of 0.78 mg/dL). Liver & Pancreas: Recent Labs  Lab 06/10/20 0623  AST 10*  ALT 14  ALKPHOS 90  BILITOT 1.2  PROT 6.1*  ALBUMIN 3.0*   No results for input(s): LIPASE, AMYLASE in the last 168 hours. No results for input(s): AMMONIA in the last 168 hours. Diabetic: No results for input(s): HGBA1C in the last 72 hours. No results for input(s): GLUCAP in the last 168 hours. Cardiac Enzymes: No results for input(s): CKTOTAL, CKMB, CKMBINDEX, TROPONINI in the last 168 hours. No results for input(s): PROBNP in the last 8760 hours. Coagulation  Profile: No results for input(s): INR, PROTIME in the last 168 hours. Thyroid Function Tests: No results for input(s): TSH, T4TOTAL, FREET4, T3FREE, THYROIDAB in the last 72 hours. Lipid Profile: No results for input(s): CHOL, HDL, LDLCALC, TRIG, CHOLHDL, LDLDIRECT in the last 72 hours. Anemia Panel: No results for input(s): VITAMINB12, FOLATE, FERRITIN, TIBC, IRON, RETICCTPCT in the last 72 hours. Urine analysis:    Component Value Date/Time   COLORURINE AMBER (A) 03/31/2017 1524   APPEARANCEUR HAZY (A) 03/31/2017 1524   LABSPEC 1.019 03/31/2017 1524   PHURINE 5.0 03/31/2017 1524   GLUCOSEU NEGATIVE 03/31/2017 1524   HGBUR LARGE (A) 03/31/2017 1524   BILIRUBINUR NEGATIVE 03/31/2017 1524   KETONESUR NEGATIVE 03/31/2017 1524   PROTEINUR 100 (A) 03/31/2017 1524   NITRITE POSITIVE (A) 03/31/2017 1524   LEUKOCYTESUR TRACE (A) 03/31/2017 1524   Sepsis Labs: Invalid input(s): PROCALCITONIN, [place]  Microbiology: Recent Results (from the past 240 hour(s))  Culture, blood (routine x 2)     Status: None (Preliminary result)   Collection Time: 06/09/20 12:23 PM   Specimen: BLOOD LEFT HAND  Result Value Ref Range Status   Specimen Description   Final    BLOOD LEFT HAND Performed at [facility], [place] [address] [place], [place] [address]    Special Requests   Final    BOTTLES DRAWN AEROBIC ONLY Blood Culture adequate volume Performed at [facility] [address], [place], [place] [address]    Culture   Final    NO GROWTH < 24 HOURS Performed at [facility] [address], [place], [place] [address]    Report Status PENDING  Incomplete  Culture, blood (routine x 2)     Status: None (Preliminary result)   Collection Time: 06/09/20 12:28 PM   Specimen: BLOOD  Result Value Ref Range Status   Specimen Description   Final    BLOOD RIGHT ANTECUBITAL Performed at [name] [address], [place],   [address]    Special Requests   Final    BOTTLES DRAWN AEROBIC AND ANAEROBIC Blood Culture adequate volume Performed at [facility], [address] [place], [place] [address]    Culture   Final    NO GROWTH < 24 HOURS Performed at [facility] [address] [place], [place] [address]    Report Status PENDING  Incomplete  Resp Panel by RT-PCR (Flu A&B, Covid) Nasopharyngeal Swab     Status: None   Collection Time: 06/09/20  1:00 PM   Specimen: Nasopharyngeal Swab; Nasopharyngeal(NP) swabs in vial transport medium  Result Value Ref Range Status   SARS Coronavirus 2 by RT PCR NEGATIVE NEGATIVE Final    Comment: (NOTE) SARS-CoV-2 target nucleic acids are NOT DETECTED.  The SARS-CoV-2 RNA is generally detectable in upper respiratory specimens during the acute phase of infection. The lowest concentration of SARS-CoV-2 viral copies this assay can detect is 138 copies/mL. A negative result does not preclude SARS-Cov-2 infection and should not be used as the sole basis for treatment or other patient management decisions. A negative result may occur with  improper specimen collection/handling, submission of specimen other than nasopharyngeal swab, presence of viral mutation(s) within the areas targeted by this assay, and inadequate number of viral copies(<138 copies/mL). A negative result must be combined with clinical observations, patient history, and epidemiological information. The expected result is Negative.  Fact Sheet for Patients:  EntrepreneurPulse.com.au  Fact Sheet for Healthcare Providers:  IncredibleEmployment.be  This test is no t yet approved or cleared by the [place] FDA and  has been authorized for detection and/or diagnosis of SARS-CoV-2 by FDA under an Emergency Use Authorization (EUA). This EUA will remain  in effect (meaning this test can be used) for the duration of the COVID-19 declaration under  Section 564(b)(1) of the Act, 21 U.S.C.section 360bbb-3(b)(1), unless the authorization is terminated  or revoked sooner.       Influenza A by PCR NEGATIVE NEGATIVE Final   Influenza B by PCR NEGATIVE NEGATIVE Final    Comment: (NOTE) The Xpert Xpress SARS-CoV-2/FLU/RSV plus assay is intended as an aid in the diagnosis of influenza from Nasopharyngeal swab specimens and should not be used as a sole basis for treatment. Nasal washings and aspirates are unacceptable for Xpert Xpress SARS-CoV-2/FLU/RSV testing.  Fact Sheet for Patients: EntrepreneurPulse.com.au  Fact Sheet for Healthcare Providers: IncredibleEmployment.be  This test is not yet approved or cleared by the [place] FDA and has been authorized for detection and/or diagnosis of SARS-CoV-2 by FDA under an Emergency Use Authorization (EUA). This EUA will remain in effect (meaning this test can be used) for the duration of the COVID-19 declaration under Section 564(b)(1) of the Act, 21 U.S.C. section 360bbb-3(b)(1), unless the authorization is terminated or revoked.  Performed at [facility], [place] [address] [place], [place] [address]     Radiology Studies: CT PELVIS W CONTRAST  Result Date: 06/09/2020 CLINICAL DATA:  Focal cutaneous lesion/infection at the groin area. EXAM: CT PELVIS WITH CONTRAST TECHNIQUE: Multidetector CT imaging of the pelvis was performed using the standard protocol following the bolus administration of intravenous contrast. CONTRAST:  147mL OMNIPAQUE IOHEXOL 300 MG/ML  SOLN COMPARISON:  None. FINDINGS: Urinary Tract:  Unremarkable where included Bowel:  Unremarkable.  Normal appendix. Vascular/Lymphatic: Aortoiliac atherosclerotic vascular disease. 1.2 cm left inguinal lymph node on image 35 series 2, likely reactive. Reactive left external iliac lymph nodes. Reproductive: T-shaped IUD projects centrally over the uterus. Adnexa unremarkable.  Other: Abnormal inflammatory stranding along the left pubis, left groin region, left labium majora, and left perineum extending to the level of the gluteal  fold. Subtle asymmetric stranding also extends in the left medial thigh superiorly. No drainable abscess. No gas in the soft tissues. Musculoskeletal: No significant muscular involvement in the inflammatory process is identified. No hip effusion. IMPRESSION: 1. Abnormal inflammatory stranding along the left pubis, left groin region, left labium majora, and left perineum extending to the level of the gluteal fold and slightly into the left medial upper thigh. No drainable abscess. No significant muscular involvement in the inflammatory process. No visible gas in the soft tissues. Appearance favors regional cellulitis. Please note that given the lack of subcutaneous gas and the patient being female, Fournier's gangrene is considered unlikely, but if the patient has special risk factors and/or rapid progression then close monitoring for fasciitis may be warranted. 2. Reactive left inguinal and external iliac lymph nodes. 3. Aortoiliac atherosclerotic vascular disease. 4. T-shaped IUD projects centrally within the uterus. 5. Aortic atherosclerosis. Aortic Atherosclerosis (ICD10-I70.0). Electronically Signed   By: Van Clines M.D.   On: 06/09/2020 14:31     Taye T. [place]  If 7PM-7AM, please contact night-coverage [email or web address] 06/10/2020, 11:56 AM

## 2020-06-10 NOTE — Progress Notes (Addendum)
Pharmacy Antibiotic Note  Sheri Perkins is a 60 y.o. female admitted on 06/09/2020 with perineum cellulitis.  Pharmacy was consulted for vancomycin, zosyn & clindamycin dosing and now Unasyn dosing. CT pelvis/abd: abnormal inflammatory stranding: No drainable abscess, no visible gas in soft tissues, appearance favors cellulitis. Renal function is stable.  Plan: Stop Clindamycin Zosyn, Vancomycin. De-escalate to Unasyn 3 g Q 6 hours. With stable renal function, pharmacy will sign off and monitor peripherally.  Height: 5' (152.4 cm) Weight: 63.5 kg (140 lb) IBW/kg (Calculated) : 45.5  Temp (24hrs), Avg:98.4 F (36.9 C), Min:97.8 F (36.6 C), Max:98.7 F (37.1 C)  Recent Labs  Lab 06/09/20 1149 06/09/20 1300 06/09/20 1423 06/10/20 0623  WBC 20.4*  --   --  12.5*  CREATININE 0.86  --   --  0.78  LATICACIDVEN  --  1.2 1.1  --     Estimated Creatinine Clearance: 61.4 mL/min (by C-G formula based on SCr of 0.78 mg/dL).    Allergies  Allergen Reactions  . Macrobid [Nitrofurantoin Macrocrystal] Other (See Comments)    "burned lungs"  . Latex Itching       . Sulfa Antibiotics Hives and Rash  Antimicrobials this admission:  3/20 cefepime x 1 3/20 flagyl x 1 3/20 vanc x 1 3/20 zosyn >> 3/21 3/20 clinda >> 3/21 3/21 unasyn >>  Dose adjustments this admission: none  Microbiology results:  3/20 flu/covid - negative 3/20 BCx2: NG <24h  Thank you for allowing pharmacy to be a part of this patient's care.  Laurey Arrow  [facility] PharmD Candidate 2022 06/10/2020 11:53 AM

## 2020-06-11 ENCOUNTER — Encounter (HOSPITAL_COMMUNITY): Payer: Self-pay | Admitting: Internal Medicine

## 2020-06-11 ENCOUNTER — Inpatient Hospital Stay (HOSPITAL_COMMUNITY): Payer: BC Managed Care – PPO | Admitting: Certified Registered Nurse Anesthetist

## 2020-06-11 ENCOUNTER — Encounter (HOSPITAL_COMMUNITY)
Admission: EM | Disposition: A | Discharge status: Home / Self Care | Payer: Self-pay | Source: Home / Self Care | Attending: Student

## 2020-06-11 DIAGNOSIS — L0231 Cutaneous abscess of buttock: Secondary | ICD-10-CM

## 2020-06-11 DIAGNOSIS — I150 Renovascular hypertension: Secondary | ICD-10-CM

## 2020-06-11 DIAGNOSIS — L03317 Cellulitis of buttock: Secondary | ICD-10-CM

## 2020-06-11 HISTORY — PX: INCISION AND DRAINAGE PERIRECTAL ABSCESS: SHX1804

## 2020-06-11 LAB — CBC
HCT: 35 % — ABNORMAL LOW (ref 36.0–46.0)
Hemoglobin: 11.3 g/dL — ABNORMAL LOW (ref 12.0–15.0)
MCH: 31 pg (ref 26.0–34.0)
MCHC: 32.3 g/dL (ref 30.0–36.0)
MCV: 95.9 fL (ref 80.0–100.0)
Platelets: 210 10*3/uL (ref 150–400)
RBC: 3.65 MIL/uL — ABNORMAL LOW (ref 3.87–5.11)
RDW: 14.6 % (ref 11.5–15.5)
WBC: 7.4 10*3/uL (ref 4.0–10.5)
nRBC: 0 % (ref 0.0–0.2)

## 2020-06-11 LAB — RENAL FUNCTION PANEL
Albumin: 2.7 g/dL — ABNORMAL LOW (ref 3.5–5.0)
Anion gap: 7 (ref 5–15)
BUN: 16 mg/dL (ref 8–23)
CO2: 21 mmol/L — ABNORMAL LOW (ref 22–32)
Calcium: 8.4 mg/dL — ABNORMAL LOW (ref 8.9–10.3)
Chloride: 114 mmol/L — ABNORMAL HIGH (ref 98–111)
Creatinine, Ser: 0.77 mg/dL (ref 0.44–1.00)
GFR, Estimated: >60 mL/min (ref >60)
Glucose, Bld: 93 mg/dL (ref 70–99)
Phosphorus: 4.2 mg/dL (ref 2.5–4.6)
Potassium: 4 mmol/L (ref 3.5–5.1)
Sodium: 142 mmol/L (ref 135–145)

## 2020-06-11 LAB — MAGNESIUM: Magnesium: 2.1 mg/dL (ref 1.7–2.4)

## 2020-06-11 SURGERY — INCISION AND DRAINAGE, ABSCESS, PERIRECTAL
Anesthesia: General

## 2020-06-11 MED ORDER — ENOXAPARIN SODIUM 40 MG/0.4ML ~~LOC~~ SOLN
40.0000 mg | SUBCUTANEOUS | Status: DC
  Administered 2020-06-12 – 2020-06-13 (×2): 40 mg via SUBCUTANEOUS
  Filled 2020-06-11 (×3): qty 0.4

## 2020-06-11 MED ORDER — FENTANYL CITRATE (PF) 250 MCG/5ML IJ SOLN
INTRAMUSCULAR | Status: DC | PRN
  Administered 2020-06-11 (×4): 25 ug via INTRAVENOUS

## 2020-06-11 MED ORDER — ALBUTEROL SULFATE HFA 108 (90 BASE) MCG/ACT IN AERS
INHALATION_SPRAY | RESPIRATORY_TRACT | Status: DC | PRN
Start: 2020-06-11 — End: 2020-06-11
  Administered 2020-06-11: 8 via RESPIRATORY_TRACT

## 2020-06-11 MED ORDER — FENTANYL CITRATE (PF) 100 MCG/2ML IJ SOLN
25.0000 ug | INTRAMUSCULAR | Status: DC | PRN
Start: 2020-06-11 — End: 2020-06-11

## 2020-06-11 MED ORDER — OXYCODONE HCL 5 MG/5ML PO SOLN: 5.0000 mg | Freq: Once | ORAL | Status: DC | PRN

## 2020-06-11 MED ORDER — PROPOFOL 10 MG/ML IV BOLUS
INTRAVENOUS | Status: AC
  Filled 2020-06-11: qty 20

## 2020-06-11 MED ORDER — DEXAMETHASONE SODIUM PHOSPHATE 10 MG/ML IJ SOLN
INTRAMUSCULAR | Status: DC | PRN
  Administered 2020-06-11: 4 mg via INTRAVENOUS

## 2020-06-11 MED ORDER — LIDOCAINE 2% (20 MG/ML) 5 ML SYRINGE
INTRAMUSCULAR | Status: AC
  Filled 2020-06-11: qty 5

## 2020-06-11 MED ORDER — DEXAMETHASONE SODIUM PHOSPHATE 10 MG/ML IJ SOLN
INTRAMUSCULAR | Status: AC
  Filled 2020-06-11: qty 1

## 2020-06-11 MED ORDER — LACTATED RINGERS IV SOLN: INTRAVENOUS | Status: DC

## 2020-06-11 MED ORDER — ONDANSETRON HCL 4 MG/2ML IJ SOLN: 4.0000 mg | Freq: Once | INTRAMUSCULAR | Status: DC | PRN

## 2020-06-11 MED ORDER — ROCURONIUM BROMIDE 10 MG/ML (PF) SYRINGE
PREFILLED_SYRINGE | INTRAVENOUS | Status: AC
  Filled 2020-06-11: qty 10

## 2020-06-11 MED ORDER — MORPHINE SULFATE (PF) 4 MG/ML IV SOLN
1.0000 mg | INTRAVENOUS | Status: DC | PRN
  Administered 2020-06-11 – 2020-06-13 (×3): 2 mg via INTRAVENOUS
  Filled 2020-06-11 (×3): qty 1

## 2020-06-11 MED ORDER — LIDOCAINE 2% (20 MG/ML) 5 ML SYRINGE
INTRAMUSCULAR | Status: DC | PRN
  Administered 2020-06-11: 80 mg via INTRAVENOUS

## 2020-06-11 MED ORDER — BUPIVACAINE HCL (PF) 0.25 % IJ SOLN
INTRAMUSCULAR | Status: DC | PRN
  Administered 2020-06-11: 15 mL

## 2020-06-11 MED ORDER — ONDANSETRON HCL 4 MG/2ML IJ SOLN
INTRAMUSCULAR | Status: AC
  Filled 2020-06-11: qty 2

## 2020-06-11 MED ORDER — OXYCODONE HCL 5 MG PO TABS
5.0000 mg | ORAL_TABLET | ORAL | Status: DC | PRN
  Administered 2020-06-13: 5 mg via ORAL
  Filled 2020-06-11: qty 1

## 2020-06-11 MED ORDER — FENTANYL CITRATE (PF) 100 MCG/2ML IJ SOLN
INTRAMUSCULAR | Status: AC
  Filled 2020-06-11: qty 2

## 2020-06-11 MED ORDER — PROPOFOL 10 MG/ML IV BOLUS
INTRAVENOUS | Status: DC | PRN
  Administered 2020-06-11: 150 mg via INTRAVENOUS

## 2020-06-11 MED ORDER — SUCCINYLCHOLINE CHLORIDE 200 MG/10ML IV SOSY
PREFILLED_SYRINGE | INTRAVENOUS | Status: DC | PRN
  Administered 2020-06-11: 120 mg via INTRAVENOUS

## 2020-06-11 MED ORDER — ALBUTEROL SULFATE HFA 108 (90 BASE) MCG/ACT IN AERS
INHALATION_SPRAY | RESPIRATORY_TRACT | Status: AC
  Filled 2020-06-11: qty 6.7

## 2020-06-11 MED ORDER — PIPERACILLIN-TAZOBACTAM 3.375 G IVPB
3.3750 g | Freq: Three times a day (TID) | INTRAVENOUS | Status: DC
  Administered 2020-06-11 – 2020-06-12 (×3): 3.375 g via INTRAVENOUS
  Filled 2020-06-11 (×3): qty 50

## 2020-06-11 MED ORDER — BUPIVACAINE HCL 0.25 % IJ SOLN
INTRAMUSCULAR | Status: AC
  Filled 2020-06-11: qty 1

## 2020-06-11 MED ORDER — OXYCODONE HCL 5 MG PO TABS: 5.0000 mg | ORAL_TABLET | Freq: Once | ORAL | Status: DC | PRN

## 2020-06-11 MED ORDER — ONDANSETRON HCL 4 MG/2ML IJ SOLN
INTRAMUSCULAR | Status: DC | PRN
  Administered 2020-06-11: 4 mg via INTRAVENOUS

## 2020-06-11 SURGICAL SUPPLY — 30 items
BLADE HEX COATED 2.75 (ELECTRODE) ×2 IMPLANT
BLADE SURG 15 STRL LF DISP TIS (BLADE) ×1 IMPLANT
BLADE SURG 15 STRL SS (BLADE) ×2
COVER SURGICAL LIGHT HANDLE (MISCELLANEOUS) ×2 IMPLANT
COVER WAND RF STERILE (DRAPES) IMPLANT
DRSG PAD ABDOMINAL 8X10 ST (GAUZE/BANDAGES/DRESSINGS) ×2 IMPLANT
ELECT REM PT RETURN 15FT ADLT (MISCELLANEOUS) ×2 IMPLANT
GAUZE 4X4 16PLY RFD (DISPOSABLE) ×2 IMPLANT
GAUZE PACKING IODOFORM 1/2 (PACKING) ×2 IMPLANT
GAUZE PACKING IODOFORM 1/4X15 (PACKING) IMPLANT
GAUZE SPONGE 4X4 12PLY STRL (GAUZE/BANDAGES/DRESSINGS) ×2 IMPLANT
GLOVE SURG ENC MOIS LTX SZ7 (GLOVE) ×4 IMPLANT
GLOVE SURG UNDER POLY LF SZ7 (GLOVE) ×2 IMPLANT
GLOVE SURG UNDER POLY LF SZ7.5 (GLOVE) ×4 IMPLANT
GOWN STRL REUS W/ TWL XL LVL3 (GOWN DISPOSABLE) ×1 IMPLANT
GOWN STRL REUS W/TWL LRG LVL3 (GOWN DISPOSABLE) ×4 IMPLANT
GOWN STRL REUS W/TWL XL LVL3 (GOWN DISPOSABLE) ×4 IMPLANT
KIT BASIN OR (CUSTOM PROCEDURE TRAY) ×2 IMPLANT
KIT TURNOVER KIT A (KITS) ×2 IMPLANT
NEEDLE HYPO 25X1 1.5 SAFETY (NEEDLE) IMPLANT
PACK LITHOTOMY IV (CUSTOM PROCEDURE TRAY) ×2 IMPLANT
PENCIL SMOKE EVACUATOR (MISCELLANEOUS) IMPLANT
SOL PREP PROV IODINE SCRUB 4OZ (MISCELLANEOUS) ×2 IMPLANT
SURGILUBE 2OZ TUBE FLIPTOP (MISCELLANEOUS) ×2 IMPLANT
SUT CHROMIC 2 0 SH (SUTURE) IMPLANT
SUT CHROMIC 3 0 SH 27 (SUTURE) IMPLANT
SWAB COLLECTION DEVICE MRSA (MISCELLANEOUS) IMPLANT
SYR CONTROL 10ML LL (SYRINGE) IMPLANT
TOWEL OR 17X26 10 PK STRL BLUE (TOWEL DISPOSABLE) ×2 IMPLANT
UNDERPAD 30X36 HEAVY ABSORB (UNDERPADS AND DIAPERS) IMPLANT

## 2020-06-11 NOTE — Anesthesia Procedure Notes (Signed)
Procedure Name: Intubation Performed by: Milford Cage, CRNA Pre-anesthesia Checklist: Patient identified, Emergency Drugs available, Suction available and Patient being monitored Patient Re-evaluated:Patient Re-evaluated prior to induction Oxygen Delivery Method: Circle System Utilized Preoxygenation: Pre-oxygenation with 100% oxygen Induction Type: IV induction and Rapid sequence Laryngoscope Size: Miller and 2 Grade View: Grade I Tube type: Oral Tube size: 7.0 mm Number of attempts: 1 Airway Equipment and Method: Stylet and Oral airway Placement Confirmation: ETT inserted through vocal cords under direct vision,  positive ETCO2 and breath sounds checked- equal and bilateral Secured at: 21 cm Tube secured with: Tape Dental Injury: Teeth and Oropharynx as per pre-operative assessment

## 2020-06-11 NOTE — Anesthesia Postprocedure Evaluation (Signed)
Anesthesia Post Note  Patient: Sheri Perkins  Procedure(s) Performed: IRRIGATION AND DEBRIDEMENT PERIRECTAL ABSCESS LYTHOTOMY (N/A )     Patient location during evaluation: PACU Anesthesia Type: General Level of consciousness: awake and alert and oriented Pain management: pain level controlled Vital Signs Assessment: post-procedure vital signs reviewed and stable Respiratory status: spontaneous breathing, nonlabored ventilation and respiratory function stable Cardiovascular status: blood pressure returned to baseline and stable Postop Assessment: no apparent nausea or vomiting Anesthetic complications: no   No complications documented.  Last Vitals:  Vitals:   06/11/20 1701 06/11/20 1709  BP:    Pulse: 67 69  Resp: 11 17  Temp:  36.8 C  SpO2: 98% 98%    Last Pain:  Vitals:   06/11/20 1709  TempSrc:   PainSc: 0-No pain                 Lynton Crescenzo A.

## 2020-06-11 NOTE — Progress Notes (Signed)
PROGRESS NOTE  Sheri Perkins PPJ:[id number] DOB: 1960-11-23   PCP: Enid Skeens., MD  Patient is from: Home.  Lives with husband.  Independently ambulates at baseline.  DOA: 06/09/2020 LOS: 2  Chief complaints: Groin pain  Brief Narrative / Interim history: 60 year old F with PMH of TIA, HTN, renal artery stenosis, osteoarthritis and tobacco use disorder presenting with pain and swelling in left perineal area involving labia majora for about a week and admitted for perineal cellulitis.  No vaginal discharge or UTI symptoms.  Reportedly up-to-date on age-appropriate cancer screening including Pap smear and colonoscopy.  Monogamous with her husband.  Bedside US and pelvic CT concerning for cellulitis involving the left pelvis, left groin, left labia majora and left perineum extending into left of the gluteal fold and left medial upper thigh with reactive inguinal and external iliac LAD but no abscess or gas. Cultures obtained.  Started on vancomycin and cefepime, and admitted.   Blood cultures negative.  Antibiotic escalated to Unasyn.  However, patient started draining purulent material on 3/22.  Exam concerning for underlying abscess.  General surgery consulted, and to take patient for I&D.  Subjective: Seen and examined earlier this morning.  Patient reports bloody purulent drainage this morning.  Denies pain, fever, UTI or GI symptoms.  Denies fever or chills.  Objective: Vitals:   06/10/20 0934 06/10/20 1327 06/10/20 2116 06/11/20 0528  BP: (!) 123/56 118/68 (!) 121/55 132/61  Pulse: 67 69 67 67  Resp: 17 16 16 16   Temp: 97.8 F (36.6 C) 97.7 F (36.5 C)  97.8 F (36.6 C)  TempSrc: Oral Oral  Oral  SpO2: 97% 97% 97% 99%  Weight:      Height:        Intake/Output Summary (Last 24 hours) at 06/11/2020 1139 Last data filed at 06/11/2020 1027 Gross per 24 hour  Intake 2152.91 ml  Output 0 ml  Net 2152.91 ml   Filed Weights   06/09/20 0902  Weight: 63.5 kg     Examination:  GENERAL: No apparent distress.  Nontoxic. HEENT: MMM.  Vision and hearing grossly intact.  NECK: Supple.  No apparent JVD.  RESP:  No IWOB.  Fair aeration bilaterally. CVS:  RRR. Heart sounds normal.  ABD/GI/GU: BS+. Abd soft, NTND.  Erythema with tenderness about left perineal area.  Some fluctuance over posterolateral aspect of left labia majora with bloody purulent drainage with gentle pressure.  FROM in both hips.  No lymphadenopathy. MSK/EXT:  Moves extremities. No apparent deformity. No edema.  SKIN: no apparent skin lesion or wound NEURO: Awake, alert and oriented appropriately.  No apparent focal neuro deficit. PSYCH: Calm. Normal affect.   Sidney Ace, RN present as chaperone during pelvic exam.  Procedures:  None  Microbiology summarized: COVID-19 and influenza PCR nonreactive. Blood cultures NGTD. Abscess culture pending  Assessment & Plan: Sepsis due to extensive perineal cellulitis without abscess or gas: POA.  She had significant leukocytosis and tachycardia with known source of infection on presentation.  No evidence of endorgan damage but significant leukocytosis.  Bedside US and CT without abscess or gas. Blood cultures NGTD. Sepsis physiology and leukocytosis resolved.  However, patient was started draining purulent material on 3/22. -Continue IV Unasyn -General surgery consulted-plan for I&D today -Follow-up abscess culture.  Renovascular hypertension/history of renal artery stenosis: Normotensive. -Continue home losartan  TIA?/hyperlipidemia: TIA seems to be remote. -Continue statin -Not sure about indication for aspirin and Plavix  Hypokalemia: Likely due to IV fluid.  Resolved. -Monitor  and replenish as appropriate  Leukocytosis/bandemia: Resolved. -Continue monitoring  Normocytic anemia: Drop likely dilutional from IV fluids Recent Labs    06/09/20 1149 06/10/20 0623 06/11/20 0514  HGB 13.4 11.7* 11.3*  -Monitor  Tobacco  use disorder: Smokes about 2 packs a day for 40 years, and cut down to about 10 cigs/day about 3 months ago. -Strongly encouraged to quit smoking cigarettes -She declined nicotine patch  Overweight Body mass index is 27.34 kg/m.         DVT prophylaxis:  enoxaparin (LOVENOX) injection 40 mg Start: 06/09/20 1800  Code Status: Full code Family Communication: Updated patient's husband at bedside on 3/21. Level of care: Med-Surg Status is: Inpatient  Remains inpatient appropriate because:Persistent severe electrolyte disturbances, Ongoing diagnostic testing needed not appropriate for outpatient work up, IV treatments appropriate due to intensity of illness or inability to take PO and Inpatient level of care appropriate due to severity of illness   Dispo: The patient is from: Home              Anticipated d/c is to: Home              Patient currently is not medically stable to d/c.   Difficult to place patient No       Consultants:  General surgery   Sch Meds:  Scheduled Meds: . aspirin EC  81 mg Oral Daily  . atorvastatin  20 mg Oral Daily  . diclofenac  50 mg Oral BID WC  . enoxaparin (LOVENOX) injection  40 mg Subcutaneous Q24H  . folic acid  1 mg Oral Daily  . losartan  50 mg Oral Daily   Continuous Infusions: . piperacillin-tazobactam (ZOSYN)  IV     PRN Meds:.acetaminophen **OR** acetaminophen, guaiFENesin-dextromethorphan, HYDROcodone-acetaminophen, ondansetron **OR** ondansetron (ZOFRAN) IV  Antimicrobials: Anti-infectives (From admission, onward)   Start     Dose/Rate Route Frequency Ordered Stop   06/11/20 1400  piperacillin-tazobactam (ZOSYN) IVPB 3.375 g        3.375 g 12.5 mL/hr over 240 Minutes Intravenous Every 8 hours 06/11/20 1053     06/10/20 1400  Ampicillin-Sulbactam (UNASYN) 3 g in sodium chloride 0.9 % 100 mL IVPB  Status:  Discontinued        3 g 200 mL/hr over 30 Minutes Intravenous Every 6 hours 06/10/20 1153 06/11/20 1053   06/10/20  1000  vancomycin (VANCOREADY) IVPB 1000 mg/200 mL  Status:  Discontinued        1,000 mg 200 mL/hr over 60 Minutes Intravenous Every 24 hours 06/09/20 1630 06/10/20 1150   06/10/20 0000  clindamycin (CLEOCIN) IVPB 600 mg  Status:  Discontinued        600 mg 100 mL/hr over 30 Minutes Intravenous Every 8 hours 06/09/20 1622 06/10/20 1150   06/09/20 2200  clindamycin (CLEOCIN) IVPB 600 mg  Status:  Discontinued        600 mg 100 mL/hr over 30 Minutes Intravenous Every 8 hours 06/09/20 1704 06/09/20 1712   06/09/20 2200  piperacillin-tazobactam (ZOSYN) IVPB 3.375 g  Status:  Discontinued        3.375 g 12.5 mL/hr over 240 Minutes Intravenous Every 8 hours 06/09/20 1623 06/10/20 1150   06/09/20 1630  clindamycin (CLEOCIN) IVPB 600 mg        600 mg 100 mL/hr over 30 Minutes Intravenous  Once 06/09/20 1622 06/09/20 1836   06/09/20 1245  vancomycin (VANCOCIN) IVPB 1000 mg/200 mL premix        1,000 mg 200  mL/hr over 60 Minutes Intravenous  Once 06/09/20 1236 06/09/20 1620   06/09/20 1245  ceFEPIme (MAXIPIME) 2 g in sodium chloride 0.9 % 100 mL IVPB        2 g 200 mL/hr over 30 Minutes Intravenous  Once 06/09/20 1236 06/09/20 1357   06/09/20 1245  metroNIDAZOLE (FLAGYL) IVPB 500 mg        500 mg 100 mL/hr over 60 Minutes Intravenous  Once 06/09/20 1236 06/09/20 1517       I have personally reviewed the following labs and images: CBC: Recent Labs  Lab 06/09/20 1149 06/10/20 0623 06/11/20 0514  WBC 20.4* 12.5* 7.4  NEUTROABS 17.1*  --   --   HGB 13.4 11.7* 11.3*  HCT 40.7 35.8* 35.0*  MCV 96.0 95.5 95.9  PLT 217 220 210   BMP &GFR Recent Labs  Lab 06/09/20 1149 06/10/20 0623 06/11/20 0514  NA 141 140 142  K 3.4* 3.0* 4.0  CL 106 108 114*  CO2 25 23 21*  GLUCOSE 105* 96 93  BUN 12 12 16   CREATININE 0.86 0.78 0.77  CALCIUM 8.9 8.3* 8.4*  MG  --  2.0 2.1  PHOS  --   --  4.2   Estimated Creatinine Clearance: 61.4 mL/min (by C-G formula based on SCr of 0.77 mg/dL). Liver &  Pancreas: Recent Labs  Lab 06/10/20 0623 06/11/20 0514  AST 10*  --   ALT 14  --   ALKPHOS 90  --   BILITOT 1.2  --   PROT 6.1*  --   ALBUMIN 3.0* 2.7*   No results for input(s): LIPASE, AMYLASE in the last 168 hours. No results for input(s): AMMONIA in the last 168 hours. Diabetic: No results for input(s): HGBA1C in the last 72 hours. No results for input(s): GLUCAP in the last 168 hours. Cardiac Enzymes: No results for input(s): CKTOTAL, CKMB, CKMBINDEX, TROPONINI in the last 168 hours. No results for input(s): PROBNP in the last 8760 hours. Coagulation Profile: No results for input(s): INR, PROTIME in the last 168 hours. Thyroid Function Tests: No results for input(s): TSH, T4TOTAL, FREET4, T3FREE, THYROIDAB in the last 72 hours. Lipid Profile: No results for input(s): CHOL, HDL, LDLCALC, TRIG, CHOLHDL, LDLDIRECT in the last 72 hours. Anemia Panel: No results for input(s): VITAMINB12, FOLATE, FERRITIN, TIBC, IRON, RETICCTPCT in the last 72 hours. Urine analysis:    Component Value Date/Time   COLORURINE AMBER (A) 03/31/2017 1524   APPEARANCEUR HAZY (A) 03/31/2017 1524   LABSPEC 1.019 03/31/2017 1524   PHURINE 5.0 03/31/2017 1524   GLUCOSEU NEGATIVE 03/31/2017 1524   HGBUR LARGE (A) 03/31/2017 1524   BILIRUBINUR NEGATIVE 03/31/2017 1524   KETONESUR NEGATIVE 03/31/2017 1524   PROTEINUR 100 (A) 03/31/2017 1524   NITRITE POSITIVE (A) 03/31/2017 1524   LEUKOCYTESUR TRACE (A) 03/31/2017 1524   Sepsis Labs: Invalid input(s): PROCALCITONIN, [place]  Microbiology: Recent Results (from the past 240 hour(s))  Culture, blood (routine x 2)     Status: None (Preliminary result)   Collection Time: 06/09/20 12:23 PM   Specimen: BLOOD LEFT HAND  Result Value Ref Range Status   Specimen Description   Final    BLOOD LEFT HAND Performed at [facility], [place] [address] [place], [place] [address]    Special Requests   Final    BOTTLES DRAWN AEROBIC ONLY  Blood Culture adequate volume Performed at [place] [address] [place], [place] [address]    Culture  Final    NO GROWTH 2 DAYS Performed at [facility], [place] [address], [place], [place] [address]    Report Status PENDING  Incomplete  Culture, blood (routine x 2)     Status: None (Preliminary result)   Collection Time: 06/09/20 12:28 PM   Specimen: BLOOD  Result Value Ref Range Status   Specimen Description   Final    BLOOD RIGHT ANTECUBITAL Performed at [place] [address] [place], [place] [address]    Special Requests   Final    BOTTLES DRAWN AEROBIC AND ANAEROBIC Blood Culture adequate volume Performed at [place] [address] [place], [place] [address]    Culture   Final    NO GROWTH 2 DAYS Performed at [place] [address], [place], [place] [address]    Report Status PENDING  Incomplete  Resp Panel by RT-PCR (Flu A&B, Covid) Nasopharyngeal Swab     Status: None   Collection Time: 06/09/20  1:00 PM   Specimen: Nasopharyngeal Swab; Nasopharyngeal(NP) swabs in vial transport medium  Result Value Ref Range Status   SARS Coronavirus 2 by RT PCR NEGATIVE NEGATIVE Final    Comment: (NOTE) SARS-CoV-2 target nucleic acids are NOT DETECTED.  The SARS-CoV-2 RNA is generally detectable in upper respiratory specimens during the acute phase of infection. The lowest concentration of SARS-CoV-2 viral copies this assay can detect is 138 copies/mL. A negative result does not preclude SARS-Cov-2 infection and should not be used as the sole basis for treatment or other patient management decisions. A negative result may occur with  improper specimen collection/handling, submission of specimen other than nasopharyngeal swab, presence of viral mutation(s) within the areas targeted by this assay, and inadequate number of viral copies(<138 copies/mL). A negative result must be combined  with clinical observations, patient history, and epidemiological information. The expected result is Negative.  Fact Sheet for Patients:  EntrepreneurPulse.com.au  Fact Sheet for Healthcare Providers:  IncredibleEmployment.be  This test is no t yet approved or cleared by the [place] FDA and  has been authorized for detection and/or diagnosis of SARS-CoV-2 by FDA under an Emergency Use Authorization (EUA). This EUA will remain  in effect (meaning this test can be used) for the duration of the COVID-19 declaration under Section 564(b)(1) of the Act, 21 U.S.C.section 360bbb-3(b)(1), unless the authorization is terminated  or revoked sooner.       Influenza A by PCR NEGATIVE NEGATIVE Final   Influenza B by PCR NEGATIVE NEGATIVE Final    Comment: (NOTE) The Xpert Xpress SARS-CoV-2/FLU/RSV plus assay is intended as an aid in the diagnosis of influenza from Nasopharyngeal swab specimens and should not be used as a sole basis for treatment. Nasal washings and aspirates are unacceptable for Xpert Xpress SARS-CoV-2/FLU/RSV testing.  Fact Sheet for Patients: EntrepreneurPulse.com.au  Fact Sheet for Healthcare Providers: IncredibleEmployment.be  This test is not yet approved or cleared by the [place] FDA and has been authorized for detection and/or diagnosis of SARS-CoV-2 by FDA under an Emergency Use Authorization (EUA). This EUA will remain in effect (meaning this test can be used) for the duration of the COVID-19 declaration under Section 564(b)(1) of the Act, 21 U.S.C. section 360bbb-3(b)(1), unless the authorization is terminated or revoked.  Performed at [facility], [place] [address], [place], [place] [address]     Radiology Studies: No results found.   Devian Bartolomei T. [place]  If 7PM-7AM, please contact  night-coverage [email or web address] 06/11/2020, 11:39 AM

## 2020-06-11 NOTE — Plan of Care (Signed)
  Problem: Clinical Measurements: Goal: Respiratory complications will improve Outcome: Progressing   Problem: Clinical Measurements: Goal: Cardiovascular complication will be avoided Outcome: Progressing   Problem: Elimination: Goal: Will not experience complications related to bowel motility Outcome: Progressing   Problem: Elimination: Goal: Will not experience complications related to urinary retention Outcome: Progressing

## 2020-06-11 NOTE — Transfer of Care (Signed)
Immediate Anesthesia Transfer of Care Note  Patient: Sheri Perkins  Procedure(s) Performed: IRRIGATION AND DEBRIDEMENT PERIRECTAL ABSCESS LYTHOTOMY (N/A )  Patient Location: PACU  Anesthesia Type:General  Level of Consciousness: awake  Airway & Oxygen Therapy: Patient Spontanous Breathing and Patient connected to face mask oxygen  Post-op Assessment: Report given to RN and Post -op Vital signs reviewed and stable  Post vital signs: Reviewed and stable  Last Vitals:  Vitals Value Taken Time  BP 123/89 06/11/20 1638  Temp 36.6 C 06/11/20 1638  Pulse 78 06/11/20 1642  Resp 18 06/11/20 1642  SpO2 100 % 06/11/20 1642  Vitals shown include unvalidated device data.  Last Pain:  Vitals:   06/11/20 1344  TempSrc: Oral  PainSc:       Patients Stated Pain Goal: 2 (63/49/49 4473)  Complications: No complications documented.

## 2020-06-11 NOTE — Op Note (Signed)
Preoperative diagnosis: perineal abscess Postoperative diagnosis: saa Procedure: incision and drainage of perineal abscess Surgeon: Dr Serita Grammes  Anesthesia general EBL: minimal Specimens: cultures to microbiology Complications none Drains none Sponge and needle count correct dispo recovery stable  Indications: 60 yo white female with a history of HTN, HLD, renal artery stenosis, and tobacco abuse, who began to develop a "bump" on her perineum a week and a half ago.  Due to worsening symptoms, she presented to [facility] on Sunday and was admitted for IV abx therapy.  Her CT was apparently negative for abscess and so she was placed on abx therapy and watched.  Her WBC was initially 20K and has resolved to [place] today.  She is AF, but this area began to spontaneously drain purulent drainage this morning.  I discussed drainage in OR.  Procedure: after informed consent obtained patient taken to OR. She was given antibiotics. She had SCDs in placed. She was placed under general anesthesia without complication. She was placed in low lithotomy and padded. She was prepped and draped in standard fashion.  Timeout was performed.  I made an elliptical incision where the drainage was occurring and the necrosis was.  There was purulence and I did a culture.  I then opened the entire cavity.  This was completely drained.  No further debridement was needed.  I then irrigated and packed with iodoform gauze. She tolerated well and was transferred to recovery stable.

## 2020-06-11 NOTE — Anesthesia Preprocedure Evaluation (Addendum)
Anesthesia Evaluation  Patient identified by MRN, date of birth, ID band Patient awake    Reviewed: Allergy & Precautions, NPO status , Patient's Chart, lab work & pertinent test results, reviewed documented beta blocker date and time   Airway Mallampati: II  TM Distance: >3 FB Neck ROM: Full    Dental no notable dental hx. (+) Lower Dentures, Upper Dentures   Pulmonary pneumonia, resolved, Patient abstained from smoking., former smoker,    Pulmonary exam normal breath sounds clear to auscultation       Cardiovascular hypertension, Pt. on medications + Peripheral Vascular Disease  Normal cardiovascular exam Rhythm:Regular Rate:Normal  Echo 05/28/17 Left ventricle: The cavity size was normal. Wall thickness was normal. Systolic function was normal. The estimated ejection fraction was in the range of 55% to 60%. Wall motion was normal; there were no regional wall motion abnormalities. Left ventricular diastolic function parameters were normal.   EKG 11/06/19 NSR, normal   Neuro/Psych  Headaches, TIAnegative psych ROS   GI/Hepatic Neg liver ROS, Peri rectal abscess   Endo/Other  Hyperlipidemia  Renal/GU Renal diseaseRenal artery stenosis  negative genitourinary   Musculoskeletal  (+) Arthritis , Osteoarthritis,    Abdominal   Peds  Hematology  (+) anemia , Plavix therapy- last dose 06/09/20 on lovenox bridge   Anesthesia Other Findings   Reproductive/Obstetrics                           Anesthesia Physical Anesthesia Plan  ASA: III  Anesthesia Plan: General   Post-op Pain Management:    Induction: Intravenous  PONV Risk Score and Plan: 4 or greater and Treatment may vary due to age or medical condition and Ondansetron  Airway Management Planned: Oral ETT  Additional Equipment:   Intra-op Plan:   Post-operative Plan: Extubation in OR  Informed Consent: I have reviewed the patients  History and Physical, chart, labs and discussed the procedure including the risks, benefits and alternatives for the proposed anesthesia with the patient or authorized representative who has indicated his/her understanding and acceptance.     Dental advisory given  Plan Discussed with: CRNA and Anesthesiologist  Anesthesia Plan Comments:         Anesthesia Quick Evaluation

## 2020-06-11 NOTE — Consult Note (Signed)
Sheri Perkins [birth year]  [id number].    Requesting MD: Dr. Wendee Beavers Chief Complaint/Reason for Consult: perineal abscess  HPI:  This is a 60 yo white female with a history of HTN, HLD, renal artery stenosis, and tobacco abuse, who began to develop a "bump" on her perineum a week and a half ago.  She dried multiple home remedies that her sister recommended.  She felt like all of these made her worse instead of better.  She denies any fevers, chills, CP, SOB, N/V, trouble voiding or moving her bowels.  Due to worsening symptoms, she presented to [facility] on Sunday and was admitted for IV abx therapy.  Her CT was apparently negative for abscess and so she was placed on abx therapy and watched.  Her WBC was initially 20K and has resolved to [place] today.  She is AF, but this area began to spontaneously drain purulent drainage this morning.  We have been asked to see her for further evaluation and recommendations.  ROS: ROS: Please see HPI, otherwise all other systems have been reviewed and are negative.  Family History  Problem Relation Age of Onset  . Melanoma Sister   . Seizures Sister   . Colon cancer Neg Hx   . Colon polyps Neg Hx   . Rectal cancer Neg Hx   . Stomach cancer Neg Hx     Past Medical History:  Diagnosis Date  . Acute bronchiolitis   . Allergy   . Arthritis    fingers , back, neck   . Hyperlipidemia   . Renal artery stenosis (HCC) 04/03/2011   right 1-59% proximal reduction,left main 1-59% reduction,  . Renovascular hypertension   . TIA (transient ischemic attack)   . Tobacco abuse     Past Surgical History:  Procedure Laterality Date  . arm surgery Bilateral   . bilateral carpal tunnel surgery    . CARPAL TUNNEL RELEASE Bilateral   . COLONOSCOPY    . FL INJ LEFT KNEE CT ARTHROGRAM ([facility] HX)    . HAND SURGERY    . KNEE SURGERY    . POLYPECTOMY    . SHOULDER SURGERY Left   . surgery to right thumb    . ulnar nerve relocation bilaterally    . US  ECHOCARDIOGRAPHY  06/16/2010   normal    Social History:  reports that she quit smoking about 3 years ago. Her smoking use included cigarettes. She smoked 0.12 packs per day. She has never used smokeless tobacco. She reports that she does not drink alcohol and does not use drugs.  Allergies:  Allergies  Allergen Reactions  . Macrobid [Nitrofurantoin Macrocrystal] Other (See Comments)    "burned lungs"  . Latex Itching       . Sulfa Antibiotics Hives and Rash    Medications Prior to Admission  Medication Sig Dispense Refill  . aspirin EC 81 MG tablet Take 81 mg by mouth daily.    Marland Kitchen atorvastatin (LIPITOR) 20 MG tablet Take 1 tablet by mouth once daily 90 tablet 1  . Cholecalciferol 1.25 MG (50000 UT) TABS Take by mouth.    . clopidogrel (PLAVIX) 75 MG tablet Take 1 tablet (75 mg total) by mouth daily. 90 tablet 3  . diclofenac (CATAFLAM) 50 MG tablet Take 50 mg by mouth 2 (two) times daily with a meal. As needed for pain and swelling    . folic acid (FOLVITE) 1 MG tablet Take 1 mg by mouth daily.    Marland Kitchen  losartan (COZAAR) 50 MG tablet Take 1 tablet (50 mg total) by mouth daily. 90 tablet 3  . methotrexate 2.5 MG tablet Take 15 mg by mouth once a week. Sundays    . topiramate (TOPAMAX) 25 MG tablet Take 2 tablets (50 mg total) by mouth 2 (two) times daily. 120 tablet 2     Physical Exam: Blood pressure 132/61, pulse 67, temperature 97.8 F (36.6 C), temperature source Oral, resp. rate 16, height 5' (1.524 m), weight 63.5 kg, SpO2 99 %. General: pleasant, WD, WN white female who is laying in bed in NAD HEENT: head is normocephalic, atraumatic.  Sclera are noninjected.  PERRL.  Ears and nose without any masses or lesions.  Mouth is pink and moist Heart: regular, rate, and rhythm.  Normal s1,s2. No obvious murmurs, gallops, or rubs noted.  Palpable radial and pedal pulses bilaterally Lungs: CTAB, no wheezes, rhonchi, or rales noted.  Respiratory effort nonlabored Abd: soft, NT, ND, +BS,  no masses, hernias, or organomegaly Skin: warm and dry with no masses, lesions, or rashes, except she has pretty extensive cellulitis involving the left proximal medial thigh extending posteriorly as well as cellulitis of her left labia and edema.  She has a fluctuant area in her perineum with skin excoriation as well as some early necrotic changes.  See picture below.  Some purulent drainage was noted.      Psych: A&Ox3 with an appropriate affect.   Results for orders placed or performed during the hospital encounter of 06/09/20 (from the past 48 hour(s))  Basic metabolic panel     Status: Abnormal   Collection Time: 06/09/20 11:49 AM  Result Value Ref Range   Sodium 141 135 - 145 mmol/L   Potassium 3.4 (L) 3.5 - 5.1 mmol/L   Chloride 106 98 - 111 mmol/L   CO2 25 22 - 32 mmol/L   Glucose, Bld 105 (H) 70 - 99 mg/dL    Comment: Glucose reference range applies only to samples taken after fasting for at least 8 hours.   BUN 12 8 - 23 mg/dL   Creatinine, Ser 0.86 0.44 - 1.00 mg/dL   Calcium 8.9 8.9 - 10.3 mg/dL   GFR, Estimated >60 >60 mL/min    Comment: (NOTE) Calculated using the CKD-EPI Creatinine Equation (2021)    Anion gap 10 5 - 15    Comment: Performed at [facility], [place] [address] [place], [place] [address]  CBC with Differential     Status: Abnormal   Collection Time: 06/09/20 11:49 AM  Result Value Ref Range   WBC 20.4 (H) 4.0 - 10.5 K/uL   RBC 4.24 3.87 - 5.11 MIL/uL   Hemoglobin 13.4 12.0 - 15.0 g/dL   HCT 40.7 36.0 - 46.0 %   MCV 96.0 80.0 - 100.0 fL   MCH 31.6 26.0 - 34.0 pg   MCHC 32.9 30.0 - 36.0 g/dL   RDW 14.7 11.5 - 15.5 %   Platelets 217 150 - 400 K/uL   nRBC 0.0 0.0 - 0.2 %   Neutrophils Relative % 85 %   Neutro Abs 17.1 (H) 1.7 - 7.7 K/uL   Lymphocytes Relative 5 %   Lymphs Abs 1.1 0.7 - 4.0 K/uL   Monocytes Relative 7 %   Monocytes Absolute 1.4 (H) 0.1 - 1.0 K/uL   Eosinophils Relative 2 %   Eosinophils Absolute 0.4 0.0 - 0.5  K/uL   Basophils Relative 0 %   Basophils Absolute 0.1 0.0 - 0.1  K/uL   Immature Granulocytes 1 %   Abs Immature Granulocytes 0.29 (H) 0.00 - 0.07 K/uL    Comment: Performed at [facility], [place] [address] [place], [place] [address]  Culture, blood (routine x 2)     Status: None (Preliminary result)   Collection Time: 06/09/20 12:23 PM   Specimen: BLOOD LEFT HAND  Result Value Ref Range   Specimen Description      BLOOD LEFT HAND Performed at [place] [address] [place], [place] [address]    Special Requests      BOTTLES DRAWN AEROBIC ONLY Blood Culture adequate volume Performed at [place] [address], [place], [place] [address]    Culture      NO GROWTH 2 DAYS Performed at [facility], [place] [address] [place], [place] [address]    Report Status PENDING   Culture, blood (routine x 2)     Status: None (Preliminary result)   Collection Time: 06/09/20 12:28 PM   Specimen: BLOOD  Result Value Ref Range   Specimen Description      BLOOD RIGHT ANTECUBITAL Performed at [place] [address], [place], [place] [address]    Special Requests      BOTTLES DRAWN AEROBIC AND ANAEROBIC Blood Culture adequate volume Performed at [place] [address], [place], [place] [address]    Culture      NO GROWTH 2 DAYS Performed at [facility], [place] [address] [place], [place] [address]    Report Status PENDING   Lactic acid, plasma     Status: None   Collection Time: 06/09/20  1:00 PM  Result Value Ref Range   Lactic Acid, Venous 1.2 0.5 - 1.9 mmol/L    Comment: Performed at [facility], [place] [address] [place], [place] [address]  Resp Panel by RT-PCR (Flu A&B, Covid) Nasopharyngeal Swab     Status: None   Collection Time: 06/09/20  1:00 PM   Specimen: Nasopharyngeal Swab; Nasopharyngeal(NP) swabs in vial transport medium  Result  Value Ref Range   SARS Coronavirus 2 by RT PCR NEGATIVE NEGATIVE    Comment: (NOTE) SARS-CoV-2 target nucleic acids are NOT DETECTED.  The SARS-CoV-2 RNA is generally detectable in upper respiratory specimens during the acute phase of infection. The lowest concentration of SARS-CoV-2 viral copies this assay can detect is 138 copies/mL. A negative result does not preclude SARS-Cov-2 infection and should not be used as the sole basis for treatment or other patient management decisions. A negative result may occur with  improper specimen collection/handling, submission of specimen other than nasopharyngeal swab, presence of viral mutation(s) within the areas targeted by this assay, and inadequate number of viral copies(<138 copies/mL). A negative result must be combined with clinical observations, patient history, and epidemiological information. The expected result is Negative.  Fact Sheet for Patients:  EntrepreneurPulse.com.au  Fact Sheet for Healthcare Providers:  IncredibleEmployment.be  This test is no t yet approved or cleared by the [place] FDA and  has been authorized for detection and/or diagnosis of SARS-CoV-2 by FDA under an Emergency Use Authorization (EUA). This EUA will remain  in effect (meaning this test can be used) for the duration of the COVID-19 declaration under Section 564(b)(1) of the Act, 21 U.S.C.section 360bbb-3(b)(1), unless the authorization is terminated  or revoked sooner.       Influenza A by PCR NEGATIVE NEGATIVE   Influenza  B by PCR NEGATIVE NEGATIVE    Comment: (NOTE) The Xpert Xpress SARS-CoV-2/FLU/RSV plus assay is intended as an aid in the diagnosis of influenza from Nasopharyngeal swab specimens and should not be used as a sole basis for treatment. Nasal washings and aspirates are unacceptable for Xpert Xpress SARS-CoV-2/FLU/RSV testing.  Fact Sheet for  Patients: EntrepreneurPulse.com.au  Fact Sheet for Healthcare Providers: IncredibleEmployment.be  This test is not yet approved or cleared by the [place] FDA and has been authorized for detection and/or diagnosis of SARS-CoV-2 by FDA under an Emergency Use Authorization (EUA). This EUA will remain in effect (meaning this test can be used) for the duration of the COVID-19 declaration under Section 564(b)(1) of the Act, 21 U.S.C. section 360bbb-3(b)(1), unless the authorization is terminated or revoked.  Performed at [facility], [place] [address], [place], [place] [address]   Lactic acid, plasma     Status: None   Collection Time: 06/09/20  2:23 PM  Result Value Ref Range   Lactic Acid, Venous 1.1 0.5 - 1.9 mmol/L    Comment: Performed at [facility], [place] [address] [place], [place] [address]  HIV Antibody (routine testing w rflx)     Status: None   Collection Time: 06/10/20  6:23 AM  Result Value Ref Range   HIV Screen 4th Generation wRfx Non Reactive Non Reactive    Comment: Performed at [facility], [place] [address] [place], [place] [address]  Comprehensive metabolic panel     Status: Abnormal   Collection Time: 06/10/20  6:23 AM  Result Value Ref Range   Sodium 140 135 - 145 mmol/L   Potassium 3.0 (L) 3.5 - 5.1 mmol/L   Chloride 108 98 - 111 mmol/L   CO2 23 22 - 32 mmol/L   Glucose, Bld 96 70 - 99 mg/dL    Comment: Glucose reference range applies only to samples taken after fasting for at least 8 hours.   BUN 12 8 - 23 mg/dL   Creatinine, Ser 0.78 0.44 - 1.00 mg/dL   Calcium 8.3 (L) 8.9 - 10.3 mg/dL   Total Protein 6.1 (L) 6.5 - 8.1 g/dL   Albumin 3.0 (L) 3.5 - 5.0 g/dL   AST 10 (L) 15 - 41 U/L   ALT 14 0 - 44 U/L   Alkaline Phosphatase 90 38 - 126 U/L   Total Bilirubin 1.2 0.3 - 1.2 mg/dL   GFR, Estimated >60 >60 mL/min    Comment: (NOTE) Calculated using the CKD-EPI Creatinine  Equation (2021)    Anion gap 9 5 - 15    Comment: Performed at [facility], [place] [address] [place], [place] [address]  CBC     Status: Abnormal   Collection Time: 06/10/20  6:23 AM  Result Value Ref Range   WBC 12.5 (H) 4.0 - 10.5 K/uL   RBC 3.75 (L) 3.87 - 5.11 MIL/uL   Hemoglobin 11.7 (L) 12.0 - 15.0 g/dL   HCT 35.8 (L) 36.0 - 46.0 %   MCV 95.5 80.0 - 100.0 fL   MCH 31.2 26.0 - 34.0 pg   MCHC 32.7 30.0 - 36.0 g/dL   RDW 14.7 11.5 - 15.5 %   Platelets 220 150 - 400 K/uL   nRBC 0.0 0.0 - 0.2 %    Comment: Performed at [facility], [place] [address], [place],  [address]  Magnesium     Status: None   Collection Time: 06/10/20  6:23  AM  Result Value Ref Range   Magnesium 2.0 1.7 - 2.4 mg/dL    Comment: Performed at [facility], [place] [address], [place], [place] [address]  Renal function panel     Status: Abnormal   Collection Time: 06/11/20  5:14 AM  Result Value Ref Range   Sodium 142 135 - 145 mmol/L   Potassium 4.0 3.5 - 5.1 mmol/L    Comment: DELTA CHECK NOTED NO VISIBLE HEMOLYSIS    Chloride 114 (H) 98 - 111 mmol/L   CO2 21 (L) 22 - 32 mmol/L   Glucose, Bld 93 70 - 99 mg/dL    Comment: Glucose reference range applies only to samples taken after fasting for at least 8 hours.   BUN 16 8 - 23 mg/dL   Creatinine, Ser 0.77 0.44 - 1.00 mg/dL   Calcium 8.4 (L) 8.9 - 10.3 mg/dL   Phosphorus 4.2 2.5 - 4.6 mg/dL   Albumin 2.7 (L) 3.5 - 5.0 g/dL   GFR, Estimated >60 >60 mL/min    Comment: (NOTE) Calculated using the CKD-EPI Creatinine Equation (2021)    Anion gap 7 5 - 15    Comment: Performed at [facility], [place] [address], [place], [place] [address]  Magnesium     Status: None   Collection Time: 06/11/20  5:14 AM  Result Value Ref Range   Magnesium 2.1 1.7 - 2.4 mg/dL    Comment: Performed at [facility], [place] [address], [place], [place] [address]  CBC     Status:  Abnormal   Collection Time: 06/11/20  5:14 AM  Result Value Ref Range   WBC 7.4 4.0 - 10.5 K/uL   RBC 3.65 (L) 3.87 - 5.11 MIL/uL   Hemoglobin 11.3 (L) 12.0 - 15.0 g/dL   HCT 35.0 (L) 36.0 - 46.0 %   MCV 95.9 80.0 - 100.0 fL   MCH 31.0 26.0 - 34.0 pg   MCHC 32.3 30.0 - 36.0 g/dL   RDW 14.6 11.5 - 15.5 %   Platelets 210 150 - 400 K/uL   nRBC 0.0 0.0 - 0.2 %    Comment: Performed at [facility], [place] [address], [place], [place] [address]   CT PELVIS W CONTRAST  Result Date: 06/09/2020 CLINICAL DATA:  Focal cutaneous lesion/infection at the groin area. EXAM: CT PELVIS WITH CONTRAST TECHNIQUE: Multidetector CT imaging of the pelvis was performed using the standard protocol following the bolus administration of intravenous contrast. CONTRAST:  145mL OMNIPAQUE IOHEXOL 300 MG/ML  SOLN COMPARISON:  None. FINDINGS: Urinary Tract:  Unremarkable where included Bowel:  Unremarkable.  Normal appendix. Vascular/Lymphatic: Aortoiliac atherosclerotic vascular disease. 1.2 cm left inguinal lymph node on image 35 series 2, likely reactive. Reactive left external iliac lymph nodes. Reproductive: T-shaped IUD projects centrally over the uterus. Adnexa unremarkable. Other: Abnormal inflammatory stranding along the left pubis, left groin region, left labium majora, and left perineum extending to the level of the gluteal fold. Subtle asymmetric stranding also extends in the left medial thigh superiorly. No drainable abscess. No gas in the soft tissues. Musculoskeletal: No significant muscular involvement in the inflammatory process is identified. No hip effusion. IMPRESSION: 1. Abnormal inflammatory stranding along the left pubis, left groin region, left labium majora, and left perineum extending to the level of the gluteal fold and slightly into the left medial upper thigh. No drainable abscess. No significant muscular involvement in the inflammatory process. No visible gas in the soft tissues.  Appearance  favors regional cellulitis. Please note that given the lack of subcutaneous gas and the patient being female, Fournier's gangrene is considered unlikely, but if the patient has special risk factors and/or rapid progression then close monitoring for fasciitis may be warranted. 2. Reactive left inguinal and external iliac lymph nodes. 3. Aortoiliac atherosclerotic vascular disease. 4. T-shaped IUD projects centrally within the uterus. 5. Aortic atherosclerosis. Aortic Atherosclerosis (ICD10-I70.0). Electronically Signed   By: Van Clines M.D.   On: 06/09/2020 14:31      Assessment/Plan HTN HLD Tobacco abuse Renal artery stenosis  Perineal abscess The patient has a perineal abscess.  We will expand her abx therapy to zosyn instead of unasyn.  We will proceed to the OR today for drainage and debridement of this area.  This has been discussed with the patient as well as the possible need for repeat drainage/debridement if needed.  She understands and agrees to proceed.  She ate at 8:15am this morning.  We will post after 2:15pm to follow our cases, but this is still deemed as an emergency given the potential for a necrotizing soft tissue infection.   FEN - NPO/IVFs VTE - lovenox ID - DC unasyn, change to zosyn  Henreitta Cea, [facility] 06/11/2020, 10:54 AM Please see Amion for pager number during day hours 7:00am-4:30pm or 7:00am -11:30am on weekends

## 2020-06-12 ENCOUNTER — Encounter (HOSPITAL_COMMUNITY): Payer: Self-pay | Admitting: General Surgery

## 2020-06-12 DIAGNOSIS — Z720 Tobacco use: Secondary | ICD-10-CM

## 2020-06-12 DIAGNOSIS — E7800 Pure hypercholesterolemia, unspecified: Secondary | ICD-10-CM

## 2020-06-12 LAB — CBC
HCT: 38 % (ref 36.0–46.0)
Hemoglobin: 12.2 g/dL (ref 12.0–15.0)
MCH: 31.5 pg (ref 26.0–34.0)
MCHC: 32.1 g/dL (ref 30.0–36.0)
MCV: 98.2 fL (ref 80.0–100.0)
Platelets: 233 10*3/uL (ref 150–400)
RBC: 3.87 MIL/uL (ref 3.87–5.11)
RDW: 14.2 % (ref 11.5–15.5)
WBC: 11.7 10*3/uL — ABNORMAL HIGH (ref 4.0–10.5)
nRBC: 0 % (ref 0.0–0.2)

## 2020-06-12 LAB — RENAL FUNCTION PANEL
Albumin: 3 g/dL — ABNORMAL LOW (ref 3.5–5.0)
Anion gap: 10 (ref 5–15)
BUN: 12 mg/dL (ref 8–23)
CO2: 21 mmol/L — ABNORMAL LOW (ref 22–32)
Calcium: 8.9 mg/dL (ref 8.9–10.3)
Chloride: 108 mmol/L (ref 98–111)
Creatinine, Ser: 0.6 mg/dL (ref 0.44–1.00)
GFR, Estimated: >60 mL/min (ref >60)
Glucose, Bld: 143 mg/dL — ABNORMAL HIGH (ref 70–99)
Phosphorus: 4.3 mg/dL (ref 2.5–4.6)
Potassium: 4.3 mmol/L (ref 3.5–5.1)
Sodium: 139 mmol/L (ref 135–145)

## 2020-06-12 LAB — CREATININE, SERUM
Creatinine, Ser: 0.68 mg/dL (ref 0.44–1.00)
GFR, Estimated: >60 mL/min (ref >60)

## 2020-06-12 LAB — MAGNESIUM: Magnesium: 1.9 mg/dL (ref 1.7–2.4)

## 2020-06-12 MED ORDER — VANCOMYCIN HCL 1000 MG/200ML IV SOLN
1000.0000 mg | Freq: Once | INTRAVENOUS | Status: AC
  Administered 2020-06-12: 1000 mg via INTRAVENOUS
  Filled 2020-06-12: qty 200

## 2020-06-12 MED ORDER — VANCOMYCIN HCL 500 MG/100ML IV SOLN
500.0000 mg | Freq: Two times a day (BID) | INTRAVENOUS | Status: DC
  Administered 2020-06-13: 500 mg via INTRAVENOUS
  Filled 2020-06-12 (×2): qty 100

## 2020-06-12 NOTE — Progress Notes (Addendum)
Pharmacy Antibiotic Note  Sheri Perkins is a 60 y.o. female admitted on 06/09/2020 with perineum cellulitis.  Pharmacy was consulted for Vancomycin dosing. CT pelvis/abd: abnormal inflammatory stranding: No drainable abscess, no visible gas in soft tissues, appearance favors cellulitis.  Last received vancomycin on 3/21, expect that dose has washed out by now. I/D performed 06/11/20 for purulence and wound cultures growing staph aureus pending susceptibilities. Renal function is stable, Scr 0.60 today.  Plan: Stop Zosyn. Vancomycin 1000 mg LD then 500 mg Q 12 hours with expected AUC 452 using SCr 0.8 Monitor renal function Vancomycin levels as needed, anticipate short course  Height: 5' (152.4 cm) Weight: 63.5 kg (140 lb) IBW/kg (Calculated) : 45.5  Temp (24hrs), Avg:98.1 F (36.7 C), Min:97.7 F (36.5 C), Max:98.4 F (36.9 C)  Recent Labs  Lab 06/09/20 1149 06/09/20 1300 06/09/20 1423 06/10/20 0623 06/11/20 0514 06/12/20 0459  WBC 20.4*  --   --  12.5* 7.4 11.7*  CREATININE 0.86  --   --  0.78 0.77 0.60  0.68  LATICACIDVEN  --  1.2 1.1  --   --   --     Estimated Creatinine Clearance: 61.4 mL/min (by C-G formula based on SCr of 0.68 mg/dL).    Allergies  Allergen Reactions  . Macrobid [Nitrofurantoin Macrocrystal] Other (See Comments)    "burned lungs"  . Latex Itching       . Sulfa Antibiotics Hives and Rash  Antimicrobials this admission:  3/20 cefepime x 1 3/20 flagyl x 1 3/20 vanc x 1 3/20 zosyn >> 3/23 3/20 clinda >> 3/21 3/21 unasyn >> 3/22 3/23 vanc >>  Dose adjustments this admission: none  Microbiology results:  3/20 flu/covid - negative 3/20 BCx2: NG x3d 3/22 Abscess, Vaginal Cxs: few staph aureus - pending susceptibilities  Thank you for allowing pharmacy to be a part of this patient's care.  Laurey Arrow  [facility] PharmD Candidate 2022 06/12/2020 1:19 PM

## 2020-06-12 NOTE — Progress Notes (Addendum)
PROGRESS NOTE  Sheri Perkins TMH:[id number] DOB: 1961/03/11   PCP: Enid Skeens., MD  Patient is from: Home.    DOA: 06/09/2020   LOS: 3  Brief Narrative / Interim history: 60 year old female with past medical history of TIA, hypertension, renal artery stenosis, osteoarthritis, tobacco use disorder presented to the hospital with pain in the left  perineal area involving labia majora for about a week and admitted for perineal cellulitis.  Patient did have [place] and pelvic CT concerning for cellulitis involving the left pelvis, left groin, left labia majora and left perineum extending into left of the gluteal fold and left medial upper thigh with reactive inguinal and external iliac LAD but no abscess or gas.  Blood cultures were obtained and patient was started on vancomycin and cefepime.  During hospitalization, blood cultures were negative.  Patient was continued on Unasyn.  Patient started draining purulent material on 06/11/2020.  Surgery was consulted and patient underwent incision and drainage on 06/11/2020   Assessment & Plan:  Principal Problem:   Cellulitis of perineum Active Problems:   Renovascular hypertension   Tobacco abuse   Hyperlipidemia   Sepsis ([facility])   Renal artery stenosis ([facility])  Sepsis due to extensive perineal cellulitis Present on admission.  Status post IND on 06/11/2020 for purulence.  Continue IV Unasyn.  Surgery has seen the patient today and recommend continued antibiotic and observation.  Abscess culture with abundant gram-positive cocci in clusters.  History of renovascular hypertension/history of renal artery stenosis:  On losartan from home.  Blood pressure is stable.  History of TIA and hyperlipidemia.  Continue statins.  Not on aspirin or Plavix.  Hypokalemia:  Improved after replacement.  Potassium of 4.3 today.  Leukocytosis/bandemia:  Mild leukocytosis at 11.7.   Normocytic anemia:  Mild.  Hemoglobin of 12.2.  Tobacco use disorder:   Significant smoker. .  Declined nicotine patch.   DVT prophylaxis:  enoxaparin (LOVENOX) injection 40 mg Start: 06/12/20 1000  Code Status: Full code  Family Communication:  None today  Level of care: Med-Surg   Status is: Inpatient  Remains inpatient appropriate because: IV treatments appropriate due to intensity of illness or inability to take PO and Inpatient level of care appropriate due to severity of illness, status post I&D   Dispo: The patient is from: Home              Anticipated d/c is to: Home              Patient currently is not medically stable to d/c.   Difficult to place patient No  Procedures:  Incision and drainage of the abscess on 06/11/2020  Consultants:  General surgery  Subjective: Today, patient was seen and examined at bedside.  Patient had incision and drainage performed on 06/11/2020.  Denies any fever, chills or rigor.  Has not had overt pain or bowel movement.   Objective: Vitals:   06/11/20 2041 06/12/20 0159 06/12/20 0548 06/12/20 0939  BP: 140/86 118/62 129/75 118/66  Pulse: 73 64 (!) 56 (!) 56  Resp: 16 16 16    Temp: 98.4 F (36.9 C)  97.7 F (36.5 C) 97.7 F (36.5 C)  TempSrc: Oral  Oral Oral  SpO2: 95% 97% 99% 98%  Weight:      Height:        Intake/Output Summary (Last 24 hours) at 06/12/2020 1107 Last data filed at 06/12/2020 1006 Gross per 24 hour  Intake 1599.87 ml  Output 50 ml  Net 1549.87  ml   Filed Weights   06/09/20 0902  Weight: 63.5 kg    Physical examination: General:  Average built, not in obvious distress HENT:   No scleral pallor or icterus noted. Oral mucosa is moist.  Chest:  Clear breath sounds.  Diminished breath sounds bilaterally. No crackles or wheezes.  CVS: S1 &S2 heard. No murmur.  Regular rate and rhythm. Abdomen: Soft, nontender, nondistended.  Bowel sounds are heard.  Status post incision and drainage of the left perineal area. Extremities: No cyanosis, clubbing or edema.  Peripheral pulses  are palpable. Psych: Alert, awake and oriented, normal mood CNS:  No cranial nerve deficits.  Power equal in all extremities.   Skin: Warm    Scheduled Meds:  Scheduled Meds: . aspirin EC  81 mg Oral Daily  . atorvastatin  20 mg Oral Daily  . diclofenac  50 mg Oral BID WC  . enoxaparin (LOVENOX) injection  40 mg Subcutaneous Q24H  . folic acid  1 mg Oral Daily  . losartan  50 mg Oral Daily   Continuous Infusions: . piperacillin-tazobactam (ZOSYN)  IV 12.5 mL/hr at 06/12/20 0550   PRN Meds:.acetaminophen **OR** acetaminophen, guaiFENesin-dextromethorphan, morphine injection, ondansetron **OR** ondansetron (ZOFRAN) IV, oxyCODONE  Antimicrobials: Anti-infectives (From admission, onward)   Start     Dose/Rate Route Frequency Ordered Stop   06/11/20 1400  piperacillin-tazobactam (ZOSYN) IVPB 3.375 g        3.375 g 12.5 mL/hr over 240 Minutes Intravenous Every 8 hours 06/11/20 1053     06/10/20 1400  Ampicillin-Sulbactam (UNASYN) 3 g in sodium chloride 0.9 % 100 mL IVPB  Status:  Discontinued        3 g 200 mL/hr over 30 Minutes Intravenous Every 6 hours 06/10/20 1153 06/11/20 1053   06/10/20 1000  vancomycin (VANCOREADY) IVPB 1000 mg/200 mL  Status:  Discontinued        1,000 mg 200 mL/hr over 60 Minutes Intravenous Every 24 hours 06/09/20 1630 06/10/20 1150   06/10/20 0000  clindamycin (CLEOCIN) IVPB 600 mg  Status:  Discontinued        600 mg 100 mL/hr over 30 Minutes Intravenous Every 8 hours 06/09/20 1622 06/10/20 1150   06/09/20 2200  clindamycin (CLEOCIN) IVPB 600 mg  Status:  Discontinued        600 mg 100 mL/hr over 30 Minutes Intravenous Every 8 hours 06/09/20 1704 06/09/20 1712   06/09/20 2200  piperacillin-tazobactam (ZOSYN) IVPB 3.375 g  Status:  Discontinued        3.375 g 12.5 mL/hr over 240 Minutes Intravenous Every 8 hours 06/09/20 1623 06/10/20 1150   06/09/20 1630  clindamycin (CLEOCIN) IVPB 600 mg        600 mg 100 mL/hr over 30 Minutes Intravenous  Once  06/09/20 1622 06/09/20 1836   06/09/20 1245  vancomycin (VANCOCIN) IVPB 1000 mg/200 mL premix        1,000 mg 200 mL/hr over 60 Minutes Intravenous  Once 06/09/20 1236 06/09/20 1620   06/09/20 1245  ceFEPIme (MAXIPIME) 2 g in sodium chloride 0.9 % 100 mL IVPB        2 g 200 mL/hr over 30 Minutes Intravenous  Once 06/09/20 1236 06/09/20 1357   06/09/20 1245  metroNIDAZOLE (FLAGYL) IVPB 500 mg        500 mg 100 mL/hr over 60 Minutes Intravenous  Once 06/09/20 1236 06/09/20 1517      I have personally reviewed the following labs and images.  CBC: Recent Labs  Lab 06/09/20 1149 06/10/20 0623 06/11/20 0514 06/12/20 0459  WBC 20.4* 12.5* 7.4 11.7*  NEUTROABS 17.1*  --   --   --   HGB 13.4 11.7* 11.3* 12.2  HCT 40.7 35.8* 35.0* 38.0  MCV 96.0 95.5 95.9 98.2  PLT 217 220 210 233   BMP &GFR Recent Labs  Lab 06/09/20 1149 06/10/20 0623 06/11/20 0514 06/12/20 0459  NA 141 140 142 139  K 3.4* 3.0* 4.0 4.3  CL 106 108 114* 108  CO2 25 23 21* 21*  GLUCOSE 105* 96 93 143*  BUN 12 12 16 12   CREATININE 0.86 0.78 0.77 0.60  0.68  CALCIUM 8.9 8.3* 8.4* 8.9  MG  --  2.0 2.1 1.9  PHOS  --   --  4.2 4.3   Estimated Creatinine Clearance: 61.4 mL/min (by C-G formula based on SCr of 0.68 mg/dL). Liver & Pancreas: Recent Labs  Lab 06/10/20 0623 06/11/20 0514 06/12/20 0459  AST 10*  --   --   ALT 14  --   --   ALKPHOS 90  --   --   BILITOT 1.2  --   --   PROT 6.1*  --   --   ALBUMIN 3.0* 2.7* 3.0*   No results for input(s): LIPASE, AMYLASE in the last 168 hours. No results for input(s): AMMONIA in the last 168 hours. Diabetic: No results for input(s): HGBA1C in the last 72 hours. No results for input(s): GLUCAP in the last 168 hours. Cardiac Enzymes: No results for input(s): CKTOTAL, CKMB, CKMBINDEX, TROPONINI in the last 168 hours. No results for input(s): PROBNP in the last 8760 hours. Coagulation Profile: No results for input(s): INR, PROTIME in the last 168  hours. Thyroid Function Tests: No results for input(s): TSH, T4TOTAL, FREET4, T3FREE, THYROIDAB in the last 72 hours. Lipid Profile: No results for input(s): CHOL, HDL, LDLCALC, TRIG, CHOLHDL, LDLDIRECT in the last 72 hours. Anemia Panel: No results for input(s): VITAMINB12, FOLATE, FERRITIN, TIBC, IRON, RETICCTPCT in the last 72 hours. Urine analysis:    Component Value Date/Time   COLORURINE AMBER (A) 03/31/2017 1524   APPEARANCEUR HAZY (A) 03/31/2017 1524   LABSPEC 1.019 03/31/2017 1524   PHURINE 5.0 03/31/2017 1524   GLUCOSEU NEGATIVE 03/31/2017 1524   HGBUR LARGE (A) 03/31/2017 1524   BILIRUBINUR NEGATIVE 03/31/2017 1524   KETONESUR NEGATIVE 03/31/2017 1524   PROTEINUR 100 (A) 03/31/2017 1524   NITRITE POSITIVE (A) 03/31/2017 1524   LEUKOCYTESUR TRACE (A) 03/31/2017 1524   Sepsis Labs: Invalid input(s): PROCALCITONIN, [place]  Microbiology: Recent Results (from the past 240 hour(s))  Culture, blood (routine x 2)     Status: None (Preliminary result)   Collection Time: 06/09/20 12:23 PM   Specimen: BLOOD LEFT HAND  Result Value Ref Range Status   Specimen Description   Final    BLOOD LEFT HAND Performed at [facility], [place] [address] [place], [place] [address]    Special Requests   Final    BOTTLES DRAWN AEROBIC ONLY Blood Culture adequate volume Performed at [facility] [address] [place], [place] [address]    Culture   Final    NO GROWTH 3 DAYS Performed at [facility], [place] [address], [place], [place] [address]    Report Status PENDING  Incomplete  Culture, blood (routine x 2)     Status: None (Preliminary result)   Collection Time: 06/09/20 12:28 PM   Specimen: BLOOD  Result Value Ref  Range Status   Specimen Description   Final    BLOOD RIGHT ANTECUBITAL Performed at [facility] [address] [place], [place] [address]    Special Requests   Final    BOTTLES DRAWN AEROBIC AND  ANAEROBIC Blood Culture adequate volume Performed at [facility] [address], [place], [place] [address]    Culture   Final    NO GROWTH 3 DAYS Performed at [facility], [place] [address], [place], [place] [address]    Report Status PENDING  Incomplete  Resp Panel by RT-PCR (Flu A&B, Covid) Nasopharyngeal Swab     Status: None   Collection Time: 06/09/20  1:00 PM   Specimen: Nasopharyngeal Swab; Nasopharyngeal(NP) swabs in vial transport medium  Result Value Ref Range Status   SARS Coronavirus 2 by RT PCR NEGATIVE NEGATIVE Final    Comment: (NOTE) SARS-CoV-2 target nucleic acids are NOT DETECTED.  The SARS-CoV-2 RNA is generally detectable in upper respiratory specimens during the acute phase of infection. The lowest concentration of SARS-CoV-2 viral copies this assay can detect is 138 copies/mL. A negative result does not preclude SARS-Cov-2 infection and should not be used as the sole basis for treatment or other patient management decisions. A negative result may occur with  improper specimen collection/handling, submission of specimen other than nasopharyngeal swab, presence of viral mutation(s) within the areas targeted by this assay, and inadequate number of viral copies(<138 copies/mL). A negative result must be combined with clinical observations, patient history, and epidemiological information. The expected result is Negative.  Fact Sheet for Patients:  EntrepreneurPulse.com.au  Fact Sheet for Healthcare Providers:  IncredibleEmployment.be  This test is no t yet approved or cleared by the [place] FDA and  has been authorized for detection and/or diagnosis of SARS-CoV-2 by FDA under an Emergency Use Authorization (EUA). This EUA will remain  in effect (meaning this test can be used) for the duration of the COVID-19 declaration under Section 564(b)(1) of the Act, 21 U.S.C.section 360bbb-3(b)(1),  unless the authorization is terminated  or revoked sooner.       Influenza A by PCR NEGATIVE NEGATIVE Final   Influenza B by PCR NEGATIVE NEGATIVE Final    Comment: (NOTE) The Xpert Xpress SARS-CoV-2/FLU/RSV plus assay is intended as an aid in the diagnosis of influenza from Nasopharyngeal swab specimens and should not be used as a sole basis for treatment. Nasal washings and aspirates are unacceptable for Xpert Xpress SARS-CoV-2/FLU/RSV testing.  Fact Sheet for Patients: EntrepreneurPulse.com.au  Fact Sheet for Healthcare Providers: IncredibleEmployment.be  This test is not yet approved or cleared by the [place] FDA and has been authorized for detection and/or diagnosis of SARS-CoV-2 by FDA under an Emergency Use Authorization (EUA). This EUA will remain in effect (meaning this test can be used) for the duration of the COVID-19 declaration under Section 564(b)(1) of the Act, 21 U.S.C. section 360bbb-3(b)(1), unless the authorization is terminated or revoked.  Performed at [facility], [place] [address], [place], [place] [address]   Aerobic Culture w Gram Stain (superficial specimen)     Status: None (Preliminary result)   Collection Time: 06/11/20 10:01 AM   Specimen: Vaginal  Result Value Ref Range Status   Specimen Description   Final    VAGINA Performed at [facility] [address], [place], [place] [address]    Special Requests   Final    NONE Performed at [facility], [place]  [address], [place], [place] [address]    Gram Stain   Final    ABUNDANT WBC PRESENT, PREDOMINANTLY PMN RARE GRAM POSITIVE COCCI IN CLUSTERS    Culture   Final    FEW STAPHYLOCOCCUS AUREUS SUSCEPTIBILITIES TO FOLLOW Performed at [facility], [place] [address] [place], [place] [address]    Report Status PENDING  Incomplete  Aerobic/Anaerobic Culture w Gram Stain (surgical/deep wound)      Status: None (Preliminary result)   Collection Time: 06/11/20  4:07 PM   Specimen: Wound; Abscess  Result Value Ref Range Status   Specimen Description   Final    ABSCESS PERIRECTAL Performed at [facility], [place] [address], [place], [place] [address]    Special Requests   Final    NONE Performed at [facility], [place] [address], [place], [place] [address]    Gram Stain   Final    ABUNDANT WBC PRESENT, PREDOMINANTLY PMN FEW GRAM POSITIVE COCCI IN CLUSTERS    Culture   Final    FEW STAPHYLOCOCCUS AUREUS SUSCEPTIBILITIES TO FOLLOW Performed at [facility], [place] [address] [place], [place] [address]    Report Status PENDING  Incomplete     Oscar La, MD [facility]  If 7PM-7AM, please contact night-coverage [email or web address] 06/12/2020, 11:07 AM

## 2020-06-12 NOTE — Discharge Instructions (Signed)
How to Take a [employer] A sitz bath is a warm water bath that may be used to care for your rectum, genital area, or the area between your rectum and genitals (perineum). In a sitz bath, the water only comes up to your hips and covers your buttocks. A sitz bath may be done in a bathtub or with a portable sitz bath that fits over the toilet. Your health care provider may recommend a sitz bath to help:  Relieve pain and discomfort after delivering a baby.  Relieve pain and itching from hemorrhoids or anal fissures.  Relieve pain after certain surgeries.  Relax muscles that are sore or tight. How to take a sitz bath Take 3-4 sitz baths a day, or as many as told by your health care provider. Bathtub sitz bath To take a sitz bath in a bathtub: 1. Partially fill a bathtub with warm water. The water should be deep enough to cover your hips and buttocks when you are sitting in the tub. 2. Follow your health care provider's instructions if you are told to put medicine in the water. 3. Sit in the water. Open the tub drain a little, and leave it open during your bath. 4. Turn on the warm water again, enough to replace the water that is draining out. Keep the water running throughout your bath. This helps keep the water at the right level and temperature. 5. Soak in the water for 15-20 minutes, or as long as told by your health care provider. 6. When you are done, be careful when you stand up. You may feel dizzy. 7. After the sitz bath, pat yourself dry. Do not rub your skin to dry it.   Over-the-toilet sitz bath To take a sitz bath with an over-the-toilet basin: 1. Follow the manufacturer's instructions. 2. Fill the basin with warm water. 3. Follow your health care provider's instructions if you were told to put medicine in the water. 4. Sit on the seat. Make sure the water covers your buttocks and perineum. 5. Soak in the water for 15-20 minutes, or as long as told by your health care  provider. 6. After the sitz bath, pat yourself dry. Do not rub your skin to dry it. 7. Clean and dry the basin between uses. 8. Discard the basin if it cracks, or according to the manufacturer's instructions.   Contact a health care provider if:  Your pain or itching gets worse. Do not continue with sitz baths if your symptoms get worse.  You have new symptoms. Do not continue with sitz baths until you talk with your health care provider. Summary  A sitz bath is a warm water bath in which the water only comes up to your hips and covers your buttocks.  A sitz bath may help relieve pain and discomfort after delivering a baby. It also may help with pain and itching from hemorrhoids or anal fissures, or pain after certain surgeries. It can also help to relax muscles that are sore or tight.  Take 3-4 sitz baths a day, or as many as told by your health care provider. Soak in the water for 15-20 minutes.  Do not continue with sitz baths if your symptoms get worse. This information is not intended to replace advice given to you by your health care provider. Make sure you discuss any questions you have with your health care provider. Document Revised: 11/23/2019 Document Reviewed: 11/23/2019 Elsevier Patient Education  [place].   Anorectal Abscess  An abscess is an infected area that contains a collection of pus. An anorectal abscess is an abscess that is near the opening of the anus or around the rectum. Without treatment, an anorectal abscess can become larger and cause other problems, such as a more serious body-wide infection or pain, especially during bowel movements. What are the causes? This condition is caused by plugged glands or an infection in one of these areas:  The anus.  The area between the anus and the scrotum in males or between the anus and the vagina in females (perineum). What increases the risk? The following factors may make you more likely to develop this  condition:  Diabetes or inflammatory bowel disease.  Having a body defense system (immune system) that is weak.  Engaging in anal sex.  Having a sexually transmitted infection (STI).  Certain kinds of cancer, such as rectal carcinoma, leukemia, or lymphoma. What are the signs or symptoms? The main symptom of this condition is pain. The pain may be a throbbing pain that gets worse during bowel movements. Other symptoms include:  Swelling and redness in the area of the abscess. The redness may go beyond the abscess and appear as a red streak on the skin.  A visible, painful lump, or a lump that can be felt when touched.  Bleeding or pus-like discharge from the area.  Fever.  General weakness.  Constipation.  Diarrhea. How is this diagnosed? This condition is diagnosed based on your medical history and a physical exam of the affected area.  This may involve examining the rectal area with a gloved hand (digital rectal exam).  Sometimes, the health care provider needs to look into the rectum using a probe, scope, or imaging test.  For women, it may require a careful vaginal exam. How is this treated? Treatment for this condition may include:  Incision and drainage surgery. This involves making an incision over the abscess to drain the pus.  Medicines, including antibiotic medicine, pain medicine, stool softeners, or laxatives. Follow these instructions at home: Medicines  Take over-the-counter and prescription medicines only as told by your health care provider.  If you were prescribed an antibiotic medicine, use it as told by your health care provider. Do not stop using the antibiotic even if you start to feel better.  Do not drive or use heavy machinery while taking prescription pain medicine. Wound care  If gauze was used in the abscess, follow instructions from your health care provider about removing or changing the gauze. It can usually be removed in 2-3  days.  Wash your hands with soap and water before you remove or change your gauze. If soap and water are not available, use hand sanitizer.  If one or more drains were placed in the abscess cavity, be careful not to pull at them. Your health care provider will tell you how long they need to remain in place.  Check your incision area every day for signs of infection. Check for: ? More redness, swelling, or pain. ? More fluid or blood. ? Warmth. ? Pus or a bad smell.   Managing pain, stiffness, and swelling  Take a sitz bath 3-4 times a day and after bowel movements. This will help reduce pain and swelling.  To relieve pain, try sitting: ? On a heating pad with the setting on low. ? On an inflatable donut-shaped cushion.  If directed, put ice on the affected area: ? Put ice in a plastic bag. ? Place a  towel between your skin and the bag. ? Leave the ice on for 20 minutes, 2-3 times a day.   General instructions  Follow any diet instructions given by your health care provider.  Keep all follow-up visits as told by your health care provider. This is important. Contact a health care provider if you have:  Bleeding from your incision.  Pain, swelling, or redness that does not improve or gets worse.  Trouble passing stool or urine.  Symptoms that return after treatment. Get help right away if you:  Have problems moving or using your legs.  Have severe or increasing pain.  Have swelling in the affected area that suddenly gets worse.  Have a large increase in bleeding or passing of pus.  Develop chills or a fever. Summary  An anorectal abscess is an abscess that is near the opening of the anus or around the rectum. An abscess is an infected area that contains a collection of pus.  The main symptom of this condition is pain. It may be a throbbing pain that gets worse during bowel movements.  Treatment for an anorectal abscess may include surgery to drain the pus from the  abscess. Medicines and sitz baths may also be a part of your treatment plan. This information is not intended to replace advice given to you by your health care provider. Make sure you discuss any questions you have with your health care provider. Document Revised: 04/15/2017 Document Reviewed: 04/15/2017 Elsevier Patient Education  2021 [employer].

## 2020-06-12 NOTE — Progress Notes (Signed)
Progress Note  1 Day Post-Op  Subjective: Patient reports pain is improved. Pads and everything needed to be changed overnight. Urinating without difficulty. Denies chills, sweats, body aches, nausea.   Objective: Vital signs in last 24 hours: Temp:  [97.5 F (36.4 C)-98.4 F (36.9 C)] 97.7 F (36.5 C) (03/23 0548) Pulse Rate:  [56-82] 56 (03/23 0548) Resp:  [11-20] 16 (03/23 0548) BP: (101-140)/(62-89) 129/75 (03/23 0548) SpO2:  [91 %-100 %] 99 % (03/23 0548) Last BM Date: 06/07/20  Intake/Output from previous day: 03/22 0701 - 03/23 0700 In: 1719.9 [P.O.:960; I.V.:550; IV Piggyback:209.9] Out: 50 [Blood:50] Intake/Output this shift: No intake/output data recorded.  PE: General: pleasant, WD, WN female who is laying in bed in NAD Lungs: Respiratory effort nonlabored GU: packing present with some bloody appearing drainage on outer dressings, surrounding erythema and induration present but no significant ttp   Lab Results:  Recent Labs    06/11/20 0514 06/12/20 0459  WBC 7.4 11.7*  HGB 11.3* 12.2  HCT 35.0* 38.0  PLT 210 233   BMET Recent Labs    06/11/20 0514 06/12/20 0459  NA 142 139  K 4.0 4.3  CL 114* 108  CO2 21* 21*  GLUCOSE 93 143*  BUN 16 12  CREATININE 0.77 0.60  0.68  CALCIUM 8.4* 8.9   PT/INR No results for input(s): LABPROT, INR in the last 72 hours. CMP     Component Value Date/Time   NA 139 06/12/2020 0459   NA 144 01/28/2018 0946   K 4.3 06/12/2020 0459   CL 108 06/12/2020 0459   CO2 21 (L) 06/12/2020 0459   GLUCOSE 143 (H) 06/12/2020 0459   BUN 12 06/12/2020 0459   BUN 11 01/28/2018 0946   CREATININE 0.68 06/12/2020 0459   CREATININE 0.60 06/12/2020 0459   CREATININE 0.64 05/10/2015 0910   CALCIUM 8.9 06/12/2020 0459   PROT 6.1 (L) 06/10/2020 0623   PROT 6.8 01/28/2018 0946   ALBUMIN 3.0 (L) 06/12/2020 0459   ALBUMIN 4.1 01/28/2018 0946   AST 10 (L) 06/10/2020 0623   ALT 14 06/10/2020 0623   ALKPHOS 90 06/10/2020 0623    BILITOT 1.2 06/10/2020 0623   BILITOT 0.4 01/28/2018 0946   GFRNONAA >60 06/12/2020 0459   GFRNONAA >60 06/12/2020 0459   GFRAA 112 01/28/2018 0946   Lipase  No results found for: LIPASE     Studies/Results: No results found.  Anti-infectives: Anti-infectives (From admission, onward)   Start     Dose/Rate Route Frequency Ordered Stop   06/11/20 1400  piperacillin-tazobactam (ZOSYN) IVPB 3.375 g        3.375 g 12.5 mL/hr over 240 Minutes Intravenous Every 8 hours 06/11/20 1053     06/10/20 1400  Ampicillin-Sulbactam (UNASYN) 3 g in sodium chloride 0.9 % 100 mL IVPB  Status:  Discontinued        3 g 200 mL/hr over 30 Minutes Intravenous Every 6 hours 06/10/20 1153 06/11/20 1053   06/10/20 1000  vancomycin (VANCOREADY) IVPB 1000 mg/200 mL  Status:  Discontinued        1,000 mg 200 mL/hr over 60 Minutes Intravenous Every 24 hours 06/09/20 1630 06/10/20 1150   06/10/20 0000  clindamycin (CLEOCIN) IVPB 600 mg  Status:  Discontinued        600 mg 100 mL/hr over 30 Minutes Intravenous Every 8 hours 06/09/20 1622 06/10/20 1150   06/09/20 2200  clindamycin (CLEOCIN) IVPB 600 mg  Status:  Discontinued  600 mg 100 mL/hr over 30 Minutes Intravenous Every 8 hours 06/09/20 1704 06/09/20 1712   06/09/20 2200  piperacillin-tazobactam (ZOSYN) IVPB 3.375 g  Status:  Discontinued        3.375 g 12.5 mL/hr over 240 Minutes Intravenous Every 8 hours 06/09/20 1623 06/10/20 1150   06/09/20 1630  clindamycin (CLEOCIN) IVPB 600 mg        600 mg 100 mL/hr over 30 Minutes Intravenous  Once 06/09/20 1622 06/09/20 1836   06/09/20 1245  vancomycin (VANCOCIN) IVPB 1000 mg/200 mL premix        1,000 mg 200 mL/hr over 60 Minutes Intravenous  Once 06/09/20 1236 06/09/20 1620   06/09/20 1245  ceFEPIme (MAXIPIME) 2 g in sodium chloride 0.9 % 100 mL IVPB        2 g 200 mL/hr over 30 Minutes Intravenous  Once 06/09/20 1236 06/09/20 1357   06/09/20 1245  metroNIDAZOLE (FLAGYL) IVPB 500 mg        500  mg 100 mL/hr over 60 Minutes Intravenous  Once 06/09/20 1236 06/09/20 1517       Assessment/Plan HTN HLD Tobacco abuse Renal artery stenosis  Perineal abscess S/p I&D 06/11/20 Dr. Donne Hazel - POD#1 - leave packing in place today and will plan on removal tomorrow AM - WBC 11.7, afebrile, cxs pending - continue abx - change outer dressings prn if soiled  FEN - reg diet  VTE - lovenox ID - zosyn  LOS: 3 days    Norm Parcel , [facility] 06/12/2020, 8:36 AM Please see Amion for pager number during day hours 7:00am-4:30pm

## 2020-06-13 DIAGNOSIS — I701 Atherosclerosis of renal artery: Secondary | ICD-10-CM

## 2020-06-13 LAB — BASIC METABOLIC PANEL
Anion gap: 8 (ref 5–15)
BUN: 20 mg/dL (ref 8–23)
CO2: 22 mmol/L (ref 22–32)
Calcium: 8.4 mg/dL — ABNORMAL LOW (ref 8.9–10.3)
Chloride: 113 mmol/L — ABNORMAL HIGH (ref 98–111)
Creatinine, Ser: 0.83 mg/dL (ref 0.44–1.00)
GFR, Estimated: >60 mL/min (ref >60)
Glucose, Bld: 138 mg/dL — ABNORMAL HIGH (ref 70–99)
Potassium: 3.8 mmol/L (ref 3.5–5.1)
Sodium: 143 mmol/L (ref 135–145)

## 2020-06-13 LAB — AEROBIC CULTURE W GRAM STAIN (SUPERFICIAL SPECIMEN)

## 2020-06-13 LAB — CBC
HCT: 34.9 % — ABNORMAL LOW (ref 36.0–46.0)
Hemoglobin: 11 g/dL — ABNORMAL LOW (ref 12.0–15.0)
MCH: 30.9 pg (ref 26.0–34.0)
MCHC: 31.5 g/dL (ref 30.0–36.0)
MCV: 98 fL (ref 80.0–100.0)
Platelets: 211 10*3/uL (ref 150–400)
RBC: 3.56 MIL/uL — ABNORMAL LOW (ref 3.87–5.11)
RDW: 14.6 % (ref 11.5–15.5)
WBC: 11.4 10*3/uL — ABNORMAL HIGH (ref 4.0–10.5)
nRBC: 0 % (ref 0.0–0.2)

## 2020-06-13 MED ORDER — POLYETHYLENE GLYCOL 3350 17 G PO PACK: 17.0000 g | PACK | Freq: Every day | ORAL | 0 refills | Status: DC | PRN

## 2020-06-13 MED ORDER — DOCUSATE SODIUM 100 MG PO CAPS: 100.0000 mg | ORAL_CAPSULE | Freq: Two times a day (BID) | ORAL | 0 refills | Status: DC

## 2020-06-13 MED ORDER — POLYETHYLENE GLYCOL 3350 17 G PO PACK: 17.0000 g | PACK | Freq: Once | ORAL | Status: AC

## 2020-06-13 MED ORDER — DOXYCYCLINE HYCLATE 100 MG PO TABS: 100.0000 mg | ORAL_TABLET | Freq: Two times a day (BID) | ORAL | 0 refills | Status: AC

## 2020-06-13 MED ORDER — OXYCODONE HCL 5 MG PO TABS: 5.0000 mg | ORAL_TABLET | Freq: Four times a day (QID) | ORAL | 0 refills | Status: DC | PRN

## 2020-06-13 MED ORDER — ACETAMINOPHEN 325 MG PO TABS: 650.0000 mg | ORAL_TABLET | Freq: Four times a day (QID) | ORAL | Status: DC | PRN

## 2020-06-13 MED ORDER — POLYETHYLENE GLYCOL 3350 17 G PO PACK
17.0000 g | PACK | Freq: Every day | ORAL | Status: DC
  Administered 2020-06-13: 17 g via ORAL
  Filled 2020-06-13: qty 1

## 2020-06-13 MED ORDER — DOCUSATE SODIUM 100 MG PO CAPS
100.0000 mg | ORAL_CAPSULE | Freq: Two times a day (BID) | ORAL | Status: DC
  Administered 2020-06-13: 100 mg via ORAL
  Filled 2020-06-13: qty 1

## 2020-06-13 NOTE — TOC Transition Note (Signed)
Transition of Care [facility]) - CM/SW Discharge Note  Patient Details  Name: REKITA MIOTKE MRN: [id number] Date of Birth: February 27, 1961  [facility]) CM/SW Contact:  Sherie Don, LCSW Phone Number: 06/13/2020, 10:43 AM  Clinical Narrative: Patient to discharge home today and in need of assistance with understanding her short-term disability paperwork/process. CSW met with patient and her husband. CSW explained patient will need to contact her employer to provide the fax number for [facility] in order for the paperwork to be sent over and completed by the surgeon's office. CSW recommended that patient contact her employer this morning before she leaves the hospital to ensure the paperwork is received in a timely manner. Patient verbalized understanding. TOC signing off.  Final next level of care: Home/Self Care Barriers to Discharge: Barriers Resolved  Patient Goals and CMS Choice Patient states their goals for this hospitalization and ongoing recovery are:: Get short-term disability through employer Choice offered to / list presented to : NA  Discharge Plan and Services         DME Arranged: N/A DME Agency: NA  Readmission Risk Interventions No flowsheet data found.

## 2020-06-13 NOTE — Discharge Summary (Signed)
Physician Discharge Summary  Sheri Perkins ZSW:[id number] DOB: [birth year] DOA: 06/09/2020  PCP: Enid Skeens., MD  Admit date: 06/09/2020 Discharge date: 06/13/2020  Admitted From: Home  Discharge disposition: Home  Recommendations for Outpatient Follow-Up:   . Follow up with your primary care provider in one week.  . Follow-up with general surgery as outpatient for wound checkup on 06/17/2020 at 11:30 AM. . Complete the course of antibiotic.   Discharge Diagnosis:   Principal Problem:   Cellulitis of perineum Active Problems:   Renovascular hypertension   Tobacco abuse   Hyperlipidemia   Sepsis ([place])   Renal artery stenosis ([place])   Discharge Condition: Improved.  Diet recommendation:   Regular.  Wound care: Local incision site care  Code status: Full.   History of Present Illness:   60 year old female with past medical history of TIA, hypertension, renal artery stenosis, osteoarthritis, tobacco use disorder presented to the hospital with pain in the left  perineal area involving labia majora for about a week and admitted for perineal cellulitis.  Patient did have [place] and pelvic CT concerning for cellulitis involving the left pelvis, left groin, left labia majora and left perineum extending into left of the gluteal fold and left medial upper thigh with reactive inguinal and external iliac LAD but no abscess or gas.  Blood cultures were obtained and patient was started on vancomycin and cefepime.  During hospitalization, blood cultures were negative.  Patient was continued on Unasyn.  Patient started draining purulent material on 06/11/2020.  Surgery was consulted and patient underwent incision and drainage on 06/11/2020.  Patient was seen by general surgery after surgical intervention.   Hospital Course:   Following conditions were addressed during hospitalization as listed below,  Sepsis due to extensive perineal cellulitis Present on admission.  Status post  I&D on 06/11/2020 for purulence.    Patient initially received IV Unasyn.    Culture showed staph aureus so it was changed to vancomycin.  Surgery followed the patient and recommended disposition home with antibiotic effective for MRSA.  We will put the patient on doxycycline for next 5 days on discharge.  Patient was allergic to Bactrim.    History of renovascular hypertension/history of renal artery stenosis:  On losartan from home.    Will resume on discharge.  History of TIA and hyperlipidemia.  Continue statins, aspirin and Plavix from home.  Hypokalemia:  Improved after replacement.  Potassium of 3.8.  Leukocytosis/bandemia:  Mild leukocytosis at 11.7.   No fever or chills.  Normocytic anemia:  Mild.  Hemoglobin of 11.0.  Follow as outpatient  Tobacco use disorder:  Significant smoker. .  Declined nicotine patch during hospitalization.  Encouraged to quit smoking  Disposition.  At this time, patient is stable for disposition to outpatient PCP and general surgery follow-up.  Spoke with the patient's family at bedside  Medical Consultants:    General surgery  Procedures:    Incision and drainage of the abscess on 06/11/2020 Subjective:   Today, patient was seen and examined at bedside.  Denies any pain, fever or chills.  Has not had a bowel movement.  Discharge Exam:   Vitals:   06/12/20 2107 06/13/20 0513  BP: 100/61 107/63  Pulse: 63 70  Resp: 16 16  Temp: 97.7 F (36.5 C) 97.7 F (36.5 C)  SpO2: 96% 98%   Vitals:   06/12/20 0939 06/12/20 1329 06/12/20 2107 06/13/20 0513  BP: 118/66 138/64 100/61 107/63  Pulse: (!) 56 60 63 70  Resp:   16 16  Temp: 97.7 F (36.5 C) 97.9 F (36.6 C) 97.7 F (36.5 C) 97.7 F (36.5 C)  TempSrc: Oral Oral Oral Oral  SpO2: 98% 99% 96% 98%  Weight:      Height:        General: Alert awake, not in obvious distress, average built HENT: pupils equally reacting to light,  No scleral pallor or icterus noted. Oral mucosa is  moist.  Chest:  Clear breath sounds.  Diminished breath sounds bilaterally. No crackles or wheezes.  CVS: S1 &S2 heard. No murmur.  Regular rate and rhythm. Abdomen: Soft, nontender, nondistended.  Bowel sounds are heard.  Status post incision and drainage of the left perineal area. Extremities: No cyanosis, clubbing or edema.  Peripheral pulses are palpable. Psych: Alert, awake and oriented, normal mood CNS:  No cranial nerve deficits.  Power equal in all extremities.   Skin: Warm and dry.   The results of significant diagnostics from this hospitalization (including imaging, microbiology, ancillary and laboratory) are listed below for reference.     Diagnostic Studies:   CT PELVIS W CONTRAST  Result Date: 06/09/2020 CLINICAL DATA:  Focal cutaneous lesion/infection at the groin area. EXAM: CT PELVIS WITH CONTRAST TECHNIQUE: Multidetector CT imaging of the pelvis was performed using the standard protocol following the bolus administration of intravenous contrast. CONTRAST:  148mL OMNIPAQUE IOHEXOL 300 MG/ML  SOLN COMPARISON:  None. FINDINGS: Urinary Tract:  Unremarkable where included Bowel:  Unremarkable.  Normal appendix. Vascular/Lymphatic: Aortoiliac atherosclerotic vascular disease. 1.2 cm left inguinal lymph node on image 35 series 2, likely reactive. Reactive left external iliac lymph nodes. Reproductive: T-shaped IUD projects centrally over the uterus. Adnexa unremarkable. Other: Abnormal inflammatory stranding along the left pubis, left groin region, left labium majora, and left perineum extending to the level of the gluteal fold. Subtle asymmetric stranding also extends in the left medial thigh superiorly. No drainable abscess. No gas in the soft tissues. Musculoskeletal: No significant muscular involvement in the inflammatory process is identified. No hip effusion. IMPRESSION: 1. Abnormal inflammatory stranding along the left pubis, left groin region, left labium majora, and left perineum  extending to the level of the gluteal fold and slightly into the left medial upper thigh. No drainable abscess. No significant muscular involvement in the inflammatory process. No visible gas in the soft tissues. Appearance favors regional cellulitis. Please note that given the lack of subcutaneous gas and the patient being female, Fournier's gangrene is considered unlikely, but if the patient has special risk factors and/or rapid progression then close monitoring for fasciitis may be warranted. 2. Reactive left inguinal and external iliac lymph nodes. 3. Aortoiliac atherosclerotic vascular disease. 4. T-shaped IUD projects centrally within the uterus. 5. Aortic atherosclerosis. Aortic Atherosclerosis (ICD10-I70.0). Electronically Signed   By: Van Clines M.D.   On: 06/09/2020 14:31     Labs:   Basic Metabolic Panel: Recent Labs  Lab 06/09/20 1149 06/10/20 0623 06/11/20 0514 06/12/20 0459 06/13/20 0430  NA 141 140 142 139 143  K 3.4* 3.0* 4.0 4.3 3.8  CL 106 108 114* 108 113*  CO2 25 23 21* 21* 22  GLUCOSE 105* 96 93 143* 138*  BUN 12 12 16 12 20   CREATININE 0.86 0.78 0.77 0.60  0.68 0.83  CALCIUM 8.9 8.3* 8.4* 8.9 8.4*  MG  --  2.0 2.1 1.9  --   PHOS  --   --  4.2 4.3  --    GFR Estimated Creatinine Clearance: 59.2  mL/min (by C-G formula based on SCr of 0.83 mg/dL). Liver Function Tests: Recent Labs  Lab 06/10/20 0623 06/11/20 0514 06/12/20 0459  AST 10*  --   --   ALT 14  --   --   ALKPHOS 90  --   --   BILITOT 1.2  --   --   PROT 6.1*  --   --   ALBUMIN 3.0* 2.7* 3.0*   No results for input(s): LIPASE, AMYLASE in the last 168 hours. No results for input(s): AMMONIA in the last 168 hours. Coagulation profile No results for input(s): INR, PROTIME in the last 168 hours.  CBC: Recent Labs  Lab 06/09/20 1149 06/10/20 0623 06/11/20 0514 06/12/20 0459 06/13/20 0430  WBC 20.4* 12.5* 7.4 11.7* 11.4*  NEUTROABS 17.1*  --   --   --   --   HGB 13.4 11.7* 11.3*  12.2 11.0*  HCT 40.7 35.8* 35.0* 38.0 34.9*  MCV 96.0 95.5 95.9 98.2 98.0  PLT 217 220 210 233 211   Cardiac Enzymes: No results for input(s): CKTOTAL, CKMB, CKMBINDEX, TROPONINI in the last 168 hours. BNP: Invalid input(s): POCBNP CBG: No results for input(s): GLUCAP in the last 168 hours. D-Dimer No results for input(s): DDIMER in the last 72 hours. Hgb A1c No results for input(s): HGBA1C in the last 72 hours. Lipid Profile No results for input(s): CHOL, HDL, LDLCALC, TRIG, CHOLHDL, LDLDIRECT in the last 72 hours. Thyroid function studies No results for input(s): TSH, T4TOTAL, T3FREE, THYROIDAB in the last 72 hours.  Invalid input(s): FREET3 Anemia work up No results for input(s): VITAMINB12, FOLATE, FERRITIN, TIBC, IRON, RETICCTPCT in the last 72 hours. Microbiology Recent Results (from the past 240 hour(s))  Culture, blood (routine x 2)     Status: None (Preliminary result)   Collection Time: 06/09/20 12:23 PM   Specimen: BLOOD LEFT HAND  Result Value Ref Range Status   Specimen Description   Final    BLOOD LEFT HAND Performed at [facility], [place] [address], [place], [place] [address]    Special Requests   Final    BOTTLES DRAWN AEROBIC ONLY Blood Culture adequate volume Performed at [place] [address] [place], [place] [address]    Culture   Final    NO GROWTH 4 DAYS Performed at [facility], [place] [address] [place], [place] [address]    Report Status PENDING  Incomplete  Culture, blood (routine x 2)     Status: None (Preliminary result)   Collection Time: 06/09/20 12:28 PM   Specimen: BLOOD  Result Value Ref Range Status   Specimen Description   Final    BLOOD RIGHT ANTECUBITAL Performed at [place] [address] [place] [address] [place], [place] [address]    Special Requests   Final    BOTTLES DRAWN AEROBIC AND ANAEROBIC Blood Culture adequate volume Performed at [facility] [place] [address], [place], [place] [address]    Culture   Final    NO GROWTH 4 DAYS Performed at [facility], [place] [address] [place], [place] [address]    Report Status PENDING  Incomplete  Resp Panel by RT-PCR (Flu A&B, Covid) Nasopharyngeal Swab     Status: None   Collection Time: 06/09/20  1:00 PM   Specimen: Nasopharyngeal Swab; Nasopharyngeal(NP) swabs in vial transport medium  Result Value Ref Range Status   SARS Coronavirus 2 by RT PCR NEGATIVE NEGATIVE Final  Comment: (NOTE) SARS-CoV-2 target nucleic acids are NOT DETECTED.  The SARS-CoV-2 RNA is generally detectable in upper respiratory specimens during the acute phase of infection. The lowest concentration of SARS-CoV-2 viral copies this assay can detect is 138 copies/mL. A negative result does not preclude SARS-Cov-2 infection and should not be used as the sole basis for treatment or other patient management decisions. A negative result may occur with  improper specimen collection/handling, submission of specimen other than nasopharyngeal swab, presence of viral mutation(s) within the areas targeted by this assay, and inadequate number of viral copies(<138 copies/mL). A negative result must be combined with clinical observations, patient history, and epidemiological information. The expected result is Negative.  Fact Sheet for Patients:  EntrepreneurPulse.com.au  Fact Sheet for Healthcare Providers:  IncredibleEmployment.be  This test is no t yet approved or cleared by the [place] FDA and  has been authorized for detection and/or diagnosis of SARS-CoV-2 by FDA under an Emergency Use Authorization (EUA). This EUA will remain  in effect (meaning this test can be used) for the duration of the COVID-19 declaration under Section 564(b)(1) of the Act, 21 U.S.C.section 360bbb-3(b)(1), unless the authorization is terminated  or revoked sooner.       Influenza A by PCR  NEGATIVE NEGATIVE Final   Influenza B by PCR NEGATIVE NEGATIVE Final    Comment: (NOTE) The Xpert Xpress SARS-CoV-2/FLU/RSV plus assay is intended as an aid in the diagnosis of influenza from Nasopharyngeal swab specimens and should not be used as a sole basis for treatment. Nasal washings and aspirates are unacceptable for Xpert Xpress SARS-CoV-2/FLU/RSV testing.  Fact Sheet for Patients: EntrepreneurPulse.com.au  Fact Sheet for Healthcare Providers: IncredibleEmployment.be  This test is not yet approved or cleared by the [place] FDA and has been authorized for detection and/or diagnosis of SARS-CoV-2 by FDA under an Emergency Use Authorization (EUA). This EUA will remain in effect (meaning this test can be used) for the duration of the COVID-19 declaration under Section 564(b)(1) of the Act, 21 U.S.C. section 360bbb-3(b)(1), unless the authorization is terminated or revoked.  Performed at [facility], [place] [address], [place], [place] [address]   Aerobic Culture w Gram Stain (superficial specimen)     Status: None (Preliminary result)   Collection Time: 06/11/20 10:01 AM   Specimen: Vaginal  Result Value Ref Range Status   Specimen Description   Final    VAGINA Performed at [place] [address], [place], [place] [address]    Special Requests   Final    NONE Performed at [facility], [place] [address], [place], [place] [address]    Gram Stain   Final    ABUNDANT WBC PRESENT, PREDOMINANTLY PMN RARE GRAM POSITIVE COCCI IN CLUSTERS    Culture   Final    FEW STAPHYLOCOCCUS AUREUS SUSCEPTIBILITIES TO FOLLOW Performed at [facility], [place] [address] [place], [place] [address]    Report Status PENDING  Incomplete  Aerobic/Anaerobic Culture w Gram Stain (surgical/deep wound)     Status: None (Preliminary result)   Collection Time: 06/11/20  4:07 PM   Specimen:  Wound; Abscess  Result Value Ref Range Status   Specimen Description   Final    ABSCESS PERIRECTAL Performed at [facility], [place] [address], [place], [place] [address]    Special Requests   Final    NONE Performed at [facility], [place] [address], [place], [place] [address]  Gram Stain   Final    ABUNDANT WBC PRESENT, PREDOMINANTLY PMN FEW GRAM POSITIVE COCCI IN CLUSTERS Performed at [facility], [place] [address] [place], [place] [address]    Culture FEW METHICILLIN RESISTANT STAPHYLOCOCCUS AUREUS  Final   Report Status PENDING  Incomplete   Organism ID, Bacteria METHICILLIN RESISTANT STAPHYLOCOCCUS AUREUS  Final      Susceptibility   Methicillin resistant staphylococcus aureus - MIC*    CIPROFLOXACIN >=8 RESISTANT Resistant     ERYTHROMYCIN >=8 RESISTANT Resistant     GENTAMICIN <=0.5 SENSITIVE Sensitive     OXACILLIN >=4 RESISTANT Resistant     TETRACYCLINE <=1 SENSITIVE Sensitive     VANCOMYCIN <=0.5 SENSITIVE Sensitive     TRIMETH/SULFA <=10 SENSITIVE Sensitive     CLINDAMYCIN <=0.25 SENSITIVE Sensitive     RIFAMPIN <=0.5 SENSITIVE Sensitive     Inducible Clindamycin NEGATIVE Sensitive     * FEW METHICILLIN RESISTANT STAPHYLOCOCCUS AUREUS     Discharge Instructions:   Discharge Instructions    Call MD for:  redness, tenderness, or signs of infection (pain, swelling, redness, odor or green/yellow discharge around incision site)   Complete by: As directed    Call MD for:  severe uncontrolled pain   Complete by: As directed    Call MD for:  temperature >100.4   Complete by: As directed    Diet - low sodium heart healthy   Complete by: As directed    Diet general   Complete by: As directed    Discharge instructions   Complete by: As directed    Continue antibiotics to complete course.  Follow-up with general surgery office next week for wound check.  Continue sitz bath at home.  Okay to cover the wound with absorbent pad.   Discharge  wound care:   Complete by: As directed    do not need to repack wound, sitz baths/showers, cover wound with absorbant pad   Increase activity slowly   Complete by: As directed      Allergies as of 06/13/2020      Reactions   Macrobid [nitrofurantoin Macrocrystal] Other (See Comments)   "burned lungs"   Latex Itching      Sulfa Antibiotics Hives, Rash      Medication List    TAKE these medications   acetaminophen 325 MG tablet Commonly known as: TYLENOL Take 2 tablets (650 mg total) by mouth every 6 (six) hours as needed for mild pain (or Fever >/= 101).   aspirin EC 81 MG tablet Take 81 mg by mouth daily.   atorvastatin 20 MG tablet Commonly known as: LIPITOR Take 1 tablet by mouth once daily   Cholecalciferol 1.25 MG (50000 UT) Tabs Take by mouth.   clopidogrel 75 MG tablet Commonly known as: PLAVIX Take 1 tablet (75 mg total) by mouth daily.   diclofenac 50 MG tablet Commonly known as: CATAFLAM Take 50 mg by mouth 2 (two) times daily with a meal. As needed for pain and swelling   docusate sodium 100 MG capsule Commonly known as: COLACE Take 1 capsule (100 mg total) by mouth 2 (two) times daily.   doxycycline 100 MG tablet Commonly known as: VIBRA-TABS Take 1 tablet (100 mg total) by mouth 2 (two) times daily for 5 days.   folic acid 1 MG tablet Commonly known as: FOLVITE Take 1 mg by mouth daily.   losartan 50 MG tablet Commonly known as: COZAAR Take 1 tablet (50 mg total) by mouth daily.  methotrexate 2.5 MG tablet Take 15 mg by mouth once a week. Sundays   oxyCODONE 5 MG immediate release tablet Commonly known as: Oxy IR/ROXICODONE Take 1 tablet (5 mg total) by mouth every 6 (six) hours as needed for moderate pain or severe pain.   polyethylene glycol 17 g packet Commonly known as: MIRALAX / GLYCOLAX Take 17 g by mouth daily as needed for moderate constipation (if not relieved with colace).   topiramate 25 MG tablet Commonly known as:  Topamax Take 2 tablets (50 mg total) by mouth 2 (two) times daily.            Discharge Care Instructions  (From admission, onward)         Start     Ordered   06/13/20 0000  Discharge wound care:       Comments: do not need to repack wound, sitz baths/showers, cover wound with absorbant pad   06/13/20 0942          Follow-up Information    Surgery, [place]. Go on 06/17/2020.   Specialty: General Surgery Why: Follow up scheduled for 11:30 AM. Please arrive 30 min prior to appointment time. Bring photo ID and insurance information.  Contact information: [address] [place] [address] [place] [address] [phone]        Enid Skeens., MD. Schedule an appointment as soon as possible for a visit in 1 week(s).   Specialty: Family Medicine Why: regular checkup Contact information: [address] [address] [phone]        Sanda Klein, MD .   Specialty: Cardiology Contact information: [address] [place] [address] [phone]                Time coordinating discharge: 39 minutes  Signed:  Laxman Pokhrel  [facility] 06/13/2020, 9:42 AM

## 2020-06-13 NOTE — Progress Notes (Signed)
Progress Note  2 Days Post-Op  Subjective: Patient reports some soreness in left groin. Denies fever or chills. She reports she has not yet had a BM. She is hoping to be able to go home today.   Objective: Vital signs in last 24 hours: Temp:  [97.7 F (36.5 C)-97.9 F (36.6 C)] 97.7 F (36.5 C) (03/24 0513) Pulse Rate:  [56-70] 70 (03/24 0513) Resp:  [16] 16 (03/24 0513) BP: (100-138)/(61-66) 107/63 (03/24 0513) SpO2:  [96 %-99 %] 98 % (03/24 0513) Last BM Date: 06/08/20  Intake/Output from previous day: 03/23 0701 - 03/24 0700 In: 1260 [P.O.:960; IV Piggyback:300] Out: 300 [Urine:300] Intake/Output this shift: No intake/output data recorded.  PE: General: pleasant, WD, WN female who is laying in bed in NAD Lungs: Respiratory effort nonlabored GU: packing removed without complication, some bloody purulent drainage on packing strips, surrounding erythema much improved, surrounding induration present but improving   Lab Results:  Recent Labs    06/12/20 0459 06/13/20 0430  WBC 11.7* 11.4*  HGB 12.2 11.0*  HCT 38.0 34.9*  PLT 233 211   BMET Recent Labs    06/12/20 0459 06/13/20 0430  NA 139 143  K 4.3 3.8  CL 108 113*  CO2 21* 22  GLUCOSE 143* 138*  BUN 12 20  CREATININE 0.60  0.68 0.83  CALCIUM 8.9 8.4*   PT/INR No results for input(s): LABPROT, INR in the last 72 hours. CMP     Component Value Date/Time   NA 143 06/13/2020 0430   NA 144 01/28/2018 0946   K 3.8 06/13/2020 0430   CL 113 (H) 06/13/2020 0430   CO2 22 06/13/2020 0430   GLUCOSE 138 (H) 06/13/2020 0430   BUN 20 06/13/2020 0430   BUN 11 01/28/2018 0946   CREATININE 0.83 06/13/2020 0430   CREATININE 0.64 05/10/2015 0910   CALCIUM 8.4 (L) 06/13/2020 0430   PROT 6.1 (L) 06/10/2020 0623   PROT 6.8 01/28/2018 0946   ALBUMIN 3.0 (L) 06/12/2020 0459   ALBUMIN 4.1 01/28/2018 0946   AST 10 (L) 06/10/2020 0623   ALT 14 06/10/2020 0623   ALKPHOS 90 06/10/2020 0623   BILITOT 1.2  06/10/2020 0623   BILITOT 0.4 01/28/2018 0946   GFRNONAA >60 06/13/2020 0430   GFRAA 112 01/28/2018 0946   Lipase  No results found for: LIPASE     Studies/Results: No results found.  Anti-infectives: Anti-infectives (From admission, onward)   Start     Dose/Rate Route Frequency Ordered Stop   06/13/20 0230  vancomycin (VANCOREADY) IVPB 500 mg/100 mL        500 mg 100 mL/hr over 60 Minutes Intravenous Every 12 hours 06/12/20 1315     06/12/20 1430  vancomycin (VANCOREADY) IVPB 1000 mg/200 mL        1,000 mg 200 mL/hr over 60 Minutes Intravenous  Once 06/12/20 1315 06/12/20 1615   06/11/20 1400  piperacillin-tazobactam (ZOSYN) IVPB 3.375 g  Status:  Discontinued        3.375 g 12.5 mL/hr over 240 Minutes Intravenous Every 8 hours 06/11/20 1053 06/12/20 1315   06/10/20 1400  Ampicillin-Sulbactam (UNASYN) 3 g in sodium chloride 0.9 % 100 mL IVPB  Status:  Discontinued        3 g 200 mL/hr over 30 Minutes Intravenous Every 6 hours 06/10/20 1153 06/11/20 1053   06/10/20 1000  vancomycin (VANCOREADY) IVPB 1000 mg/200 mL  Status:  Discontinued        1,000 mg 200 mL/hr  over 60 Minutes Intravenous Every 24 hours 06/09/20 1630 06/10/20 1150   06/10/20 0000  clindamycin (CLEOCIN) IVPB 600 mg  Status:  Discontinued        600 mg 100 mL/hr over 30 Minutes Intravenous Every 8 hours 06/09/20 1622 06/10/20 1150   06/09/20 2200  clindamycin (CLEOCIN) IVPB 600 mg  Status:  Discontinued        600 mg 100 mL/hr over 30 Minutes Intravenous Every 8 hours 06/09/20 1704 06/09/20 1712   06/09/20 2200  piperacillin-tazobactam (ZOSYN) IVPB 3.375 g  Status:  Discontinued        3.375 g 12.5 mL/hr over 240 Minutes Intravenous Every 8 hours 06/09/20 1623 06/10/20 1150   06/09/20 1630  clindamycin (CLEOCIN) IVPB 600 mg        600 mg 100 mL/hr over 30 Minutes Intravenous  Once 06/09/20 1622 06/09/20 1836   06/09/20 1245  vancomycin (VANCOCIN) IVPB 1000 mg/200 mL premix        1,000 mg 200 mL/hr over  60 Minutes Intravenous  Once 06/09/20 1236 06/09/20 1620   06/09/20 1245  ceFEPIme (MAXIPIME) 2 g in sodium chloride 0.9 % 100 mL IVPB        2 g 200 mL/hr over 30 Minutes Intravenous  Once 06/09/20 1236 06/09/20 1357   06/09/20 1245  metroNIDAZOLE (FLAGYL) IVPB 500 mg        500 mg 100 mL/hr over 60 Minutes Intravenous  Once 06/09/20 1236 06/09/20 1517       Assessment/Plan HTN HLD Tobacco abuse Renal artery stenosis  Perineal abscess S/p I&D 06/11/20 Dr. Donne Hazel - POD#2 - packing removed without complication - WBC 43.8, afebrile, cxs with staph aureas - sensitivities pending  - do not need to repack wound, sitz baths/showers, cover wound with absorbant pad - would recommend another 4-5 days PO abx on discharge, follow up in office next week for wound check - ok to discharge from a surgical standpoint when cleared from a medical standpoint   FEN -reg diet, added bowel regimen VTE -lovenox ID -IV vanc  LOS: 4 days    Norm Parcel , [facility] 06/13/2020, 8:22 AM Please see Amion for pager number during day hours 7:00am-4:30pm

## 2020-06-14 LAB — CULTURE, BLOOD (ROUTINE X 2)
Culture: NO GROWTH
Culture: NO GROWTH
Special Requests: ADEQUATE
Special Requests: ADEQUATE

## 2020-06-16 LAB — AEROBIC/ANAEROBIC CULTURE W GRAM STAIN (SURGICAL/DEEP WOUND)

## 2020-06-21 DIAGNOSIS — R768 Other specified abnormal immunological findings in serum: Secondary | ICD-10-CM | POA: Diagnosis not present

## 2020-06-21 DIAGNOSIS — R5382 Chronic fatigue, unspecified: Secondary | ICD-10-CM | POA: Diagnosis not present

## 2020-06-21 DIAGNOSIS — M2550 Pain in unspecified joint: Secondary | ICD-10-CM | POA: Diagnosis not present

## 2020-06-21 DIAGNOSIS — M0579 Rheumatoid arthritis with rheumatoid factor of multiple sites without organ or systems involvement: Secondary | ICD-10-CM | POA: Diagnosis not present

## 2020-07-10 DIAGNOSIS — Z6828 Body mass index (BMI) 28.0-28.9, adult: Secondary | ICD-10-CM | POA: Diagnosis not present

## 2020-07-10 DIAGNOSIS — Z01419 Encounter for gynecological examination (general) (routine) without abnormal findings: Secondary | ICD-10-CM | POA: Diagnosis not present

## 2020-07-10 DIAGNOSIS — Z1231 Encounter for screening mammogram for malignant neoplasm of breast: Secondary | ICD-10-CM | POA: Diagnosis not present

## 2020-07-10 DIAGNOSIS — Z13820 Encounter for screening for osteoporosis: Secondary | ICD-10-CM | POA: Diagnosis not present

## 2020-07-18 ENCOUNTER — Other Ambulatory Visit: Payer: Self-pay

## 2020-07-18 ENCOUNTER — Ambulatory Visit: Payer: BC Managed Care – PPO | Admitting: Family Medicine

## 2020-07-18 ENCOUNTER — Encounter: Payer: Self-pay | Admitting: Family Medicine

## 2020-07-18 VITALS — BP 104/71 | HR 85 | Ht 60.0 in | Wt 140.0 lb

## 2020-07-18 DIAGNOSIS — G44209 Tension-type headache, unspecified, not intractable: Secondary | ICD-10-CM | POA: Diagnosis not present

## 2020-07-18 DIAGNOSIS — G459 Transient cerebral ischemic attack, unspecified: Secondary | ICD-10-CM | POA: Diagnosis not present

## 2020-07-18 DIAGNOSIS — I150 Renovascular hypertension: Secondary | ICD-10-CM | POA: Diagnosis not present

## 2020-07-18 MED ORDER — TOPIRAMATE 25 MG PO TABS: 50.0000 mg | ORAL_TABLET | Freq: Every day | ORAL | 1 refills | Status: DC

## 2020-07-18 NOTE — Progress Notes (Signed)
I agree with the above plan 

## 2020-07-18 NOTE — Patient Instructions (Signed)
Below is our plan:  We will restart topiramate. Start 1 tablet (25mg ) daily for 1 week, then increase to 2 tabelts (50mg ) daily.   Please make sure you are staying well hydrated. I recommend 50-60 ounces daily. Well balanced diet and regular exercise encouraged. Consistent sleep schedule with 6-8 hours recommended.   Please continue follow up with care team as directed.   Follow up with me in 6 months   You may receive a survey regarding today's visit. I encourage you to leave honest feed back as I do use this information to improve patient care. Thank you for seeing me today!      Tension Headache, Adult A tension headache is a feeling of pain, pressure, or aching in the head. It is often felt over the front and sides of the head. Tension headaches can last from 30 minutes to several days. What are the causes? The cause of this condition is not known. Sometimes, tension headaches are brought on by stress, worry (anxiety), or depression. Other things that may set them off include:  Alcohol.  Too much caffeine or caffeine withdrawal.  Colds, flu, or sinus infections.  Dental problems. This can include clenching your teeth.  Being tired.  Holding your head and neck in the same position for a long time, such as while using a computer.  Smoking.  Arthritis in the neck. What are the signs or symptoms?  Feeling pressure around the head.  A dull ache in the head.  Pain over the front and sides of the head.  Feeling sore or tender in the muscles of the head, neck, and shoulders. How is this treated? This condition may be treated with lifestyle changes and with medicines that help relieve symptoms. Follow these instructions at home: Managing pain  Take over-the-counter and prescription medicines only as told by your doctor.  When you have a headache, lie down in a dark, quiet room.  If told, put ice on your head and neck. To do this: ? Put ice in a plastic bag. ? Place  a towel between your skin and the bag. ? Leave the ice on for 20 minutes, 2-3 times a day. ? Take off the ice if your skin turns bright red. This is very important. If you cannot feel pain, heat, or cold, you have a greater risk of damage to the area.  If told, put heat on the back of your neck. Do this as often as told by your doctor. Use the heat source that your doctor recommends, such as a moist heat pack or a heating pad. ? Place a towel between your skin and the heat source. ? Leave the heat on for 20-30 minutes. ? Take off the heat if your skin turns bright red. This is very important. If you cannot feel pain, heat, or cold, you have a greater risk of getting burned. Eating and drinking  Eat meals on a regular schedule.  If you drink alcohol: ? Limit how much you have to:  0-1 drink a day for women who are not pregnant.  0-2 drinks a day for men. ? Know how much alcohol is in your drink. In the [place] one drink equals one 12 oz bottle of beer (355 mL), one 5 oz glass of wine (148 mL), or one 1 oz glass of hard liquor (44 mL).  Drink enough fluid to keep your pee (urine) pale yellow.  Do not use a lot of caffeine, or stop using caffeine. Lifestyle  Get 7-9 hours of sleep each night. Or get the amount of sleep that your doctor tells you to.  At bedtime, keep computers, phones, and tablets out of your room.  Find ways to lessen your stress. This may include: ? Exercise. ? Deep breathing. ? Yoga. ? Listening to music. ? Thinking positive thoughts.  Sit up straight. Try to relax your muscles.  Do not smoke or use any products that contain nicotine or tobacco. If you need help quitting, ask your doctor. General instructions  Avoid things that can bring on headaches. Keep a headache journal to see what may bring on headaches. For example, write down: ? What you eat and drink. ? How much sleep you get. ? Any change to your diet or medicines.  Keep all follow-up visits.    Contact a doctor if:  Your headache does not get better.  Your headache comes back.  You have a headache, and sounds, light, or smells bother you.  You feel like you may vomit, or you vomit.  Your stomach hurts. Get help right away if:  You all of a sudden get a very bad headache with any of these things: ? A stiff neck. ? Feeling like you may vomit. ? Vomiting. ? Feeling mixed up (confused). ? Feeling weak in one part or one side of your body. ? Having trouble seeing or speaking, or both. ? Feeling short of breath. ? A rash. ? Feeling very sleepy. ? Pain in your eye or ear. ? Trouble walking or balancing. ? Feeling like you will faint, or you faint. Summary  A tension headache is pain, pressure, or aching in your head.  Tension headaches can last from 30 minutes to several days.  Lifestyle changes and medicines may help relieve pain. This information is not intended to replace advice given to you by your health care provider. Make sure you discuss any questions you have with your health care provider. Document Revised: 12/07/2019 Document Reviewed: 12/07/2019 Elsevier Patient Education  2021 [employer].

## 2020-07-18 NOTE — Progress Notes (Signed)
Chief Complaint  Patient presents with  . Follow-up    Rm 2 alone PT is well, has about 3 headaches a month.      HISTORY OF PRESENT ILLNESS: 07/18/20 ALL:  She returns for follow up for headaches. She reports taking topiramate until around 05/2020 when hospitalized for cellulitis of perineum. She did not continue following discharge. She feels it was helping when taking regularly. She has stopped taking Tylenol and ibuprofen as she was recently started on prednisone and meloxicam for arthritis. She continues close follow up with PCP. BP is well managed. She continues Plavix and asa.    04/03/2020 ALL:  Sheri Perkins is a 60 y.o. female here today for follow up for headaches. She was last seen by Dr Leonie Man in 10/2018 when topiramate was increased to 50mg  BID. She reports that headaches are better. She may 8-10 headaches a month. She rarely has severe symptoms. Occasionally has nausea but feels this is triggered by smell of solvents used at work. She is having to wear a mask at work and feels this contributed. She alternates Tylenol and ibuprofen. She usually takes either 4 tablets of Tylenol or 4 tablets of ibuprofen everyday before work (5 days a week).   She reports that she has had more dizziness over the past two months. She has had 3 falls in the period of time. She had a event last week when she woke up around 2:30 to get ready for work. She raised out of the bed and stood up then felt dizzy. She had to grab a piece of furniture to keep from falling. Another episode was when she was sitting in the floor. She stood up and walked into another room and felt dizzy. Dizziness resolves after a few seconds. She reports that she can experience the dizziness when walking as well. She fell while coming down from a latter to the attack and missed the last step. The other two falls were when she was in a squatting and fell backwards. She reports recent issues with her blood pressures. Losartan was  increased last year. BP had been "running high". She was at work on 12/30 and states that she "didn't feel right". She felt lightheaded. The nurse checked her blood pressure at work and reading was 85/57. She tried to stay at work but continued to feel unbalanced. She had to go home. She called her cardiologist and he reduced Losartan to 50mg . She has felt better since. She was seen for follow up with cardiology yesterday and reports that reading were good.   She does have a history of TIA.  She continues regular follow up with PCP for stroke prevention and management of comorbidities. She continues aspirin 81mg  and atorvastatin 20mg  daily. She had stopped Plavix but recently advised to resume by cardiology. She reports last cigarette was 03/19/2020. She seems to be doing well with smoking cessation.    HISTORY (copied from previous note)  11/03/2018 PS: HPI: Initial visit 08/25/2018:Sheri Perkins is a 60 year old pleasant Caucasian lady seen today for initial office consultation visit for TIA and chronic headaches.  History is obtained from the patient and review of referral notes.  She states that sometime in January or February she had a brief episode of sudden onset of left face arm and leg tingling and numbness.  This lasted about 30 minutes.  She had a mild headache which was not disabling.  She did not have any significant weakness and was able to walk  fine at that time.  She did not have any slurred speech.  She checked her blood pressure at that time it was fine.  She did not seek medical help right away.  She mentioned this several weeks later with a primary care visit.  She did see a cardiologist in March who ordered carotid ultrasound which I have personally reviewed showed no significant extracranial stenosis as well as 30-day heart monitor which was negative for significant arrhythmias.  Patient has not had any recurrent TIA or stroke symptoms.  She denies slurred speech, extremity weakness, gait or  balance problems.  Review of her electronic medical records show that she had an MRI scan of the brain and MRA of the brain which was normal in 2006 at that time she had episode of dizziness lightheadedness and passing out.  The patient also complains of her chronic daily headaches which she is had for 2 years.  She describes a headache present mostly bifrontally dull in 6/10 in severity occasionally throbbing.  The headaches are noticed during the day after going to work every day.  She has been taking now 4 tablets of Tylenol every day prior to going to work in the past she was taking 4 tablets of ibuprofen.  She has not tried any medication for headache prophylaxis.  She does complain of tightness of the muscles of her neck.  She does not do any regular neck stretching exercises.  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  She has no prior history of strokes or TIAs.  She does smoke 1 pack/day.  Update 11/03/2018 : She returns for follow-up after last visit 2 months ago.  She states her headaches are improved but they are still occurring 3 times a week or so.  They are much milder.  She takes Tylenol which helps.  She is tolerating Topamax 25 mg twice daily quite well without any side effects.  She is on aspirin 81 mg daily which is tolerating well.  Her blood pressure usually is better controlled today it is elevated in the office at 189/81.  She has no new complaints.  She did undergo MRI scan of the brain on 09/15/2018 which was unremarkable.  She admits to being under significant stress since she is bringing up 3 of her nieces children   REVIEW OF SYSTEMS: Out of a complete 14 system review of symptoms, the patient complains only of the following symptoms, headaches, arthritis and all other reviewed systems are negative.   ALLERGIES: Allergies  Allergen  Reactions  . Macrobid [Nitrofurantoin Macrocrystal] Other (See Comments)    "burned lungs"  . Latex Itching       . Sulfa Antibiotics Hives and Rash     HOME MEDICATIONS: Outpatient Medications Prior to Visit  Medication Sig Dispense Refill  . aspirin EC 81 MG tablet Take 81 mg by mouth daily.    Marland Kitchen atorvastatin (LIPITOR) 20 MG tablet Take 1 tablet by mouth once daily 90 tablet 1  . CALCIUM CARBONATE-VITAMIN D PO Take 1 tablet by mouth. 2000 iu    . clopidogrel (PLAVIX) 75 MG tablet Take 1 tablet (75 mg total) by mouth daily. 90 tablet 3  . folic acid (FOLVITE) 1 MG tablet Take 1 mg by mouth daily.    Marland Kitchen losartan (COZAAR) 50 MG tablet Take 50 mg by  mouth daily.    . meloxicam (MOBIC) 15 MG tablet Take 15 mg by mouth daily.    . methotrexate 2.5 MG tablet Take 15 mg by mouth once a week. Sundays    . predniSONE (DELTASONE) 5 MG tablet Take by mouth.    Marland Kitchen acetaminophen (TYLENOL) 325 MG tablet Take 2 tablets (650 mg total) by mouth every 6 (six) hours as needed for mild pain (or Fever >/= 101).    . Cholecalciferol 1.25 MG (50000 UT) TABS Take by mouth.    . diclofenac (CATAFLAM) 50 MG tablet Take 50 mg by mouth 2 (two) times daily with a meal. As needed for pain and swelling    . docusate sodium (COLACE) 100 MG capsule Take 1 capsule (100 mg total) by mouth 2 (two) times daily. 10 capsule 0  . losartan (COZAAR) 50 MG tablet Take 1 tablet (50 mg total) by mouth daily. 90 tablet 3  . oxyCODONE (OXY IR/ROXICODONE) 5 MG immediate release tablet Take 1 tablet (5 mg total) by mouth every 6 (six) hours as needed for moderate pain or severe pain. 15 tablet 0  . polyethylene glycol (MIRALAX / GLYCOLAX) 17 g packet Take 17 g by mouth daily as needed for moderate constipation (if not relieved with colace). 14 each 0  . topiramate (TOPAMAX) 25 MG tablet Take 2 tablets (50 mg total) by mouth 2 (two) times daily. 120 tablet 2   No facility-administered medications prior to visit.     PAST MEDICAL  HISTORY: Past Medical History:  Diagnosis Date  . Acute bronchiolitis   . Allergy   . Arthritis    fingers , back, neck   . Hyperlipidemia   . Renal artery stenosis ([facility]) 04/03/2011   right 1-59% proximal reduction,left main 1-59% reduction,  . Renovascular hypertension   . TIA (transient ischemic attack)   . Tobacco abuse      PAST SURGICAL HISTORY: Past Surgical History:  Procedure Laterality Date  . arm surgery Bilateral   . bilateral carpal tunnel surgery    . CARPAL TUNNEL RELEASE Bilateral   . COLONOSCOPY    . FL INJ LEFT KNEE CT ARTHROGRAM ([facility] HX)    . HAND SURGERY    . INCISION AND DRAINAGE PERIRECTAL ABSCESS N/A 06/11/2020   Procedure: IRRIGATION AND DEBRIDEMENT PERIRECTAL ABSCESS LYTHOTOMY;  Surgeon: Rolm Bookbinder, MD;  Location: [facility]S;  Service: General;  Laterality: N/A;  . KNEE SURGERY    . POLYPECTOMY    . SHOULDER SURGERY Left   . surgery to right thumb    . ulnar nerve relocation bilaterally    . US ECHOCARDIOGRAPHY  06/16/2010   normal     FAMILY HISTORY: Family History  Problem Relation Age of Onset  . Melanoma Sister   . Seizures Sister   . Colon cancer Neg Hx   . Colon polyps Neg Hx   . Rectal cancer Neg Hx   . Stomach cancer Neg Hx      SOCIAL HISTORY: Social History   Socioeconomic History  . Marital status: Married    Spouse name: Not on file  . Number of children: Not on file  . Years of education: Not on file  . Highest education level: Not on file  Occupational History  . Not on file  Tobacco Use  . Smoking status: Former Smoker    Packs/day: 0.12    Types: Cigarettes    Quit date: 04/03/2017    Years since quitting: 3.2  . Smokeless  tobacco: Never Used  Substance and Sexual Activity  . Alcohol use: No    Alcohol/week: 0.0 standard drinks  . Drug use: No  . Sexual activity: Not on file  Other Topics Concern  . Not on file  Social History Narrative  . Not on file   Social Determinants of Health   Financial  Resource Strain: Not on file  Food Insecurity: Not on file  Transportation Needs: Not on file  Physical Activity: Not on file  Stress: Not on file  Social Connections: Not on file  Intimate Partner Violence: Not on file      PHYSICAL EXAM  Vitals:   07/18/20 1351  BP: 104/71  Pulse: 85  Weight: 140 lb (63.5 kg)  Height: 5' (1.524 m)   Body mass index is 27.34 kg/m.  No data found.   Generalized: Well developed, in no acute distress  Cardiology: normal rate and rhythm, no murmur auscultated  Respiratory: clear to auscultation bilaterally    Neurological examination  Mentation: Alert oriented to time, place, history taking. Follows all commands speech and language fluent Cranial nerve II-XII: Pupils were equal round reactive to light. Extraocular movements were full, visual field were full on confrontational test. Facial sensation and strength were normal. Head turning and shoulder shrug  were normal and symmetric. No nystagmus. Motor: The motor testing reveals 5 over 5 strength of all 4 extremities. Good symmetric motor tone is noted throughout.   Gait and station: Gait is normal.    DIAGNOSTIC DATA (LABS, IMAGING, TESTING) - I reviewed patient records, labs, notes, testing and imaging myself where available.  Lab Results  Component Value Date   WBC 11.4 (H) 06/13/2020   HGB 11.0 (L) 06/13/2020   HCT 34.9 (L) 06/13/2020   MCV 98.0 06/13/2020   PLT 211 06/13/2020      Component Value Date/Time   NA 143 06/13/2020 0430   NA 144 01/28/2018 0946   K 3.8 06/13/2020 0430   CL 113 (H) 06/13/2020 0430   CO2 22 06/13/2020 0430   GLUCOSE 138 (H) 06/13/2020 0430   BUN 20 06/13/2020 0430   BUN 11 01/28/2018 0946   CREATININE 0.83 06/13/2020 0430   CREATININE 0.64 05/10/2015 0910   CALCIUM 8.4 (L) 06/13/2020 0430   PROT 6.1 (L) 06/10/2020 0623   PROT 6.8 01/28/2018 0946   ALBUMIN 3.0 (L) 06/12/2020 0459   ALBUMIN 4.1 01/28/2018 0946   AST 10 (L) 06/10/2020 0623    ALT 14 06/10/2020 0623   ALKPHOS 90 06/10/2020 0623   BILITOT 1.2 06/10/2020 0623   BILITOT 0.4 01/28/2018 0946   GFRNONAA >60 06/13/2020 0430   GFRAA 112 01/28/2018 0946   Lab Results  Component Value Date   CHOL 126 08/25/2018   HDL 47 08/25/2018   LDLCALC 61 08/25/2018   TRIG 90 08/25/2018   CHOLHDL 2.7 08/25/2018   Lab Results  Component Value Date   HGBA1C 5.4 08/25/2018   No results found for: VITAMINB12 No results found for: TSH  No flowsheet data found.   ASSESSMENT AND PLAN  60 y.o. year old female  has a past medical history of Acute bronchiolitis, Allergy, Arthritis, Hyperlipidemia, Renal artery stenosis ([place]) (04/03/2011), Renovascular hypertension, TIA (transient ischemic attack), and Tobacco abuse. here with   Tension headache  TIA (transient ischemic attack)  Renovascular hypertension  Sheri Perkins is doing very well from a headache perspective. She has not continued topiramate since hospitalization in 05/2020 but does feel it was helping headaches. She will  restart topiramate 50 mg daily. I have encouraged her to follow-up closely with primary care for stroke prevention.  She will continue Plavix and aspirin as well as atorvastatin for stroke prevention. I will have her follow-up with me in 6 months, sooner if needed. She verbalizes understanding and agreement with this plan.   No orders of the defined types were placed in this encounter.    Debbora Presto, MSN, FNP-C 07/18/2020, 2:17 PM  [facility] [address], [place], [place] [address] [phone]

## 2020-08-16 DIAGNOSIS — M2550 Pain in unspecified joint: Secondary | ICD-10-CM | POA: Diagnosis not present

## 2020-08-16 DIAGNOSIS — R5382 Chronic fatigue, unspecified: Secondary | ICD-10-CM | POA: Diagnosis not present

## 2020-08-16 DIAGNOSIS — M0579 Rheumatoid arthritis with rheumatoid factor of multiple sites without organ or systems involvement: Secondary | ICD-10-CM | POA: Diagnosis not present

## 2020-08-16 DIAGNOSIS — Z79899 Other long term (current) drug therapy: Secondary | ICD-10-CM | POA: Diagnosis not present

## 2020-10-29 DIAGNOSIS — N6489 Other specified disorders of breast: Secondary | ICD-10-CM | POA: Diagnosis not present

## 2020-11-01 DIAGNOSIS — M0579 Rheumatoid arthritis with rheumatoid factor of multiple sites without organ or systems involvement: Secondary | ICD-10-CM | POA: Diagnosis not present

## 2020-11-01 DIAGNOSIS — Z79899 Other long term (current) drug therapy: Secondary | ICD-10-CM | POA: Diagnosis not present

## 2020-11-01 DIAGNOSIS — R5382 Chronic fatigue, unspecified: Secondary | ICD-10-CM | POA: Diagnosis not present

## 2020-11-01 DIAGNOSIS — M2550 Pain in unspecified joint: Secondary | ICD-10-CM | POA: Diagnosis not present

## 2020-11-17 ENCOUNTER — Emergency Department (HOSPITAL_BASED_OUTPATIENT_CLINIC_OR_DEPARTMENT_OTHER): Payer: BC Managed Care – PPO

## 2020-11-17 ENCOUNTER — Encounter (HOSPITAL_BASED_OUTPATIENT_CLINIC_OR_DEPARTMENT_OTHER): Payer: Self-pay | Admitting: Emergency Medicine

## 2020-11-17 ENCOUNTER — Other Ambulatory Visit: Payer: Self-pay

## 2020-11-17 ENCOUNTER — Emergency Department (HOSPITAL_BASED_OUTPATIENT_CLINIC_OR_DEPARTMENT_OTHER)
Admission: EM | Admit: 2020-11-17 | Discharge: 2020-11-17 | Disposition: A | Discharge status: Home / Self Care | Payer: BC Managed Care – PPO | Attending: Emergency Medicine | Admitting: Emergency Medicine

## 2020-11-17 DIAGNOSIS — S0990XA Unspecified injury of head, initial encounter: Secondary | ICD-10-CM | POA: Diagnosis not present

## 2020-11-17 DIAGNOSIS — Z7902 Long term (current) use of antithrombotics/antiplatelets: Secondary | ICD-10-CM | POA: Diagnosis not present

## 2020-11-17 DIAGNOSIS — Y9389 Activity, other specified: Secondary | ICD-10-CM | POA: Diagnosis not present

## 2020-11-17 DIAGNOSIS — M25521 Pain in right elbow: Secondary | ICD-10-CM | POA: Diagnosis not present

## 2020-11-17 DIAGNOSIS — M25511 Pain in right shoulder: Secondary | ICD-10-CM | POA: Diagnosis not present

## 2020-11-17 DIAGNOSIS — R55 Syncope and collapse: Secondary | ICD-10-CM | POA: Diagnosis not present

## 2020-11-17 DIAGNOSIS — Y92816 Subway car as the place of occurrence of the external cause: Secondary | ICD-10-CM | POA: Insufficient documentation

## 2020-11-17 DIAGNOSIS — I10 Essential (primary) hypertension: Secondary | ICD-10-CM | POA: Diagnosis not present

## 2020-11-17 DIAGNOSIS — W19XXXA Unspecified fall, initial encounter: Secondary | ICD-10-CM | POA: Diagnosis not present

## 2020-11-17 DIAGNOSIS — S0003XA Contusion of scalp, initial encounter: Secondary | ICD-10-CM | POA: Insufficient documentation

## 2020-11-17 DIAGNOSIS — Z7982 Long term (current) use of aspirin: Secondary | ICD-10-CM | POA: Diagnosis not present

## 2020-11-17 DIAGNOSIS — Z91040 Latex allergy status: Secondary | ICD-10-CM | POA: Diagnosis not present

## 2020-11-17 DIAGNOSIS — Z79899 Other long term (current) drug therapy: Secondary | ICD-10-CM | POA: Diagnosis not present

## 2020-11-17 DIAGNOSIS — F17210 Nicotine dependence, cigarettes, uncomplicated: Secondary | ICD-10-CM | POA: Insufficient documentation

## 2020-11-17 DIAGNOSIS — Z043 Encounter for examination and observation following other accident: Secondary | ICD-10-CM | POA: Diagnosis not present

## 2020-11-17 LAB — CBC WITH DIFFERENTIAL/PLATELET
Abs Immature Granulocytes: 0.58 10*3/uL — ABNORMAL HIGH (ref 0.00–0.07)
Basophils Absolute: 0.1 10*3/uL (ref 0.0–0.1)
Basophils Relative: 1 %
Eosinophils Absolute: 0.3 10*3/uL (ref 0.0–0.5)
Eosinophils Relative: 3 %
HCT: 42.4 % (ref 36.0–46.0)
Hemoglobin: 14.7 g/dL (ref 12.0–15.0)
Immature Granulocytes: 4 %
Lymphocytes Relative: 16 %
Lymphs Abs: 2.2 10*3/uL (ref 0.7–4.0)
MCH: 33.9 pg (ref 26.0–34.0)
MCHC: 34.7 g/dL (ref 30.0–36.0)
MCV: 97.9 fL (ref 80.0–100.0)
Monocytes Absolute: 2 10*3/uL — ABNORMAL HIGH (ref 0.1–1.0)
Monocytes Relative: 15 %
Neutro Abs: 8.4 10*3/uL — ABNORMAL HIGH (ref 1.7–7.7)
Neutrophils Relative %: 61 %
Platelets: 256 10*3/uL (ref 150–400)
RBC: 4.33 MIL/uL (ref 3.87–5.11)
RDW: 14.5 % (ref 11.5–15.5)
WBC: 13.6 10*3/uL — ABNORMAL HIGH (ref 4.0–10.5)
nRBC: 0 % (ref 0.0–0.2)

## 2020-11-17 LAB — COMPREHENSIVE METABOLIC PANEL
ALT: 13 U/L (ref 0–44)
AST: 14 U/L — ABNORMAL LOW (ref 15–41)
Albumin: 3.9 g/dL (ref 3.5–5.0)
Alkaline Phosphatase: 93 U/L (ref 38–126)
Anion gap: 11 (ref 5–15)
BUN: 20 mg/dL (ref 8–23)
CO2: 21 mmol/L — ABNORMAL LOW (ref 22–32)
Calcium: 8.7 mg/dL — ABNORMAL LOW (ref 8.9–10.3)
Chloride: 104 mmol/L (ref 98–111)
Creatinine, Ser: 0.85 mg/dL (ref 0.44–1.00)
GFR, Estimated: >60 mL/min (ref >60)
Glucose, Bld: 97 mg/dL (ref 70–99)
Potassium: 3.5 mmol/L (ref 3.5–5.1)
Sodium: 136 mmol/L (ref 135–145)
Total Bilirubin: 0.7 mg/dL (ref 0.3–1.2)
Total Protein: 6.9 g/dL (ref 6.5–8.1)

## 2020-11-17 LAB — TROPONIN I (HIGH SENSITIVITY): Troponin I (High Sensitivity): 2 ng/L (ref <18)

## 2020-11-17 LAB — URINALYSIS, MICROSCOPIC (REFLEX)

## 2020-11-17 LAB — URINALYSIS, ROUTINE W REFLEX MICROSCOPIC
Bilirubin Urine: NEGATIVE
Glucose, UA: NEGATIVE mg/dL
Hgb urine dipstick: NEGATIVE
Ketones, ur: NEGATIVE mg/dL
Nitrite: NEGATIVE
Protein, ur: NEGATIVE mg/dL
Specific Gravity, Urine: 1.01 (ref 1.005–1.030)
pH: 6 (ref 5.0–8.0)

## 2020-11-17 LAB — MAGNESIUM: Magnesium: 2.2 mg/dL (ref 1.7–2.4)

## 2020-11-17 LAB — PROTIME-INR
INR: 1 (ref 0.8–1.2)
Prothrombin Time: 13.3 seconds (ref 11.4–15.2)

## 2020-11-17 MED ORDER — LACTATED RINGERS IV BOLUS
1000.0000 mL | Freq: Once | INTRAVENOUS | Status: AC
  Administered 2020-11-17: 1000 mL via INTRAVENOUS

## 2020-11-17 MED ORDER — ACETAMINOPHEN 325 MG PO TABS
650.0000 mg | ORAL_TABLET | Freq: Once | ORAL | Status: AC
  Administered 2020-11-17: 650 mg via ORAL
  Filled 2020-11-17: qty 2

## 2020-11-17 NOTE — Discharge Instructions (Addendum)
You were seen in the ER today after your episode of passing out yesterday. Your physical exam, blood work, chest x-ray, and EKG were very reassuring. While the exact cause of your syncopal episode remains unclear, there does not appear to be any emergent issue with your heart or lungs at this time.  Do recommend you follow-up closely with your cardiologist in the next week for further evaluation.  Please increase your hydration at home and return to ER with any chest pain, trouble breathing, nausea vomiting does not stop, confusion, blurry vision, double vision, or any further syncopal episodes, or if you develop any other new severe symptoms.

## 2020-11-17 NOTE — ED Provider Notes (Addendum)
[facility] EMERGENCY DEPARTMENT Provider Note   CSN: [id number] Arrival date & time: 11/17/20  1358     History Chief Complaint  Patient presents with   Fall   Loss of Consciousness    Sheri Perkins is a 60 y.o. female who presents with concern for generalized weakness and headache after syncopal episode yesterday.  States that she was at [employer] paying for a sandwich when she suddenly felt very lightheaded and passed out.  She did subsequently syncopize and was unconscious for several minutes.  Was moved to the back of an ambulance where she woke up and refused transport to the hospital.  She denies any chest pain, palpitations, nausea, vomiting, blurry or double vision since the fall or prior to syncopal episode.  She does endorse headache at this time with bump to the back of her head on the right side.  Globally feeling weak without unilateral weakness.  I personally reviewed patient's medical records.  She has history of Neurocardiogenic syncope, hyperlipidemia, TIA anticoagulated with aspirin and Plavix, hypertension, renal artery stenosis.   HPI     Past Medical History:  Diagnosis Date   Acute bronchiolitis    Allergy    Arthritis    fingers , back, neck    Hyperlipidemia    Renal artery stenosis ([place]) 04/03/2011   right 1-59% proximal reduction,left main 1-59% reduction,   Renovascular hypertension    TIA (transient ischemic attack)    Tobacco abuse     Patient Active Problem List   Diagnosis Date Noted   Cellulitis of perineum 06/09/2020   Essential hypertension 01/17/2020   Renal artery stenosis ([place]) 01/17/2020   Head ache 10/06/2018   History of TIA (transient ischemic attack) 01/26/2018   Dyslipidemia 01/26/2018   AKI (acute kidney injury) ([place]) 03/31/2017   Sepsis ([place]) 03/31/2017   Acute respiratory failure ([place]) 03/31/2017   Leukocytosis 03/31/2017   Respiratory distress 03/31/2017   CAP (community acquired pneumonia) 03/31/2017    Elevated transaminase level 03/31/2017   Influenza with pneumonia 03/31/2017   Neurocardiogenic syncope 07/27/2016   Renovascular hypertension 08/12/2012   Tobacco abuse 08/12/2012   Hyperlipidemia 08/12/2012   Encounter for long-term (current) use of other medications 08/12/2012   CHANGE IN BOWELS 12/23/2009   PERSONAL HISTORY OF COLONIC POLYPS 12/23/2009    Past Surgical History:  Procedure Laterality Date   arm surgery Bilateral    bilateral carpal tunnel surgery     CARPAL TUNNEL RELEASE Bilateral    COLONOSCOPY     FL INJ LEFT KNEE CT ARTHROGRAM ([facility] HX)     HAND SURGERY     INCISION AND DRAINAGE PERIRECTAL ABSCESS N/A 06/11/2020   Procedure: IRRIGATION AND DEBRIDEMENT PERIRECTAL ABSCESS LYTHOTOMY;  Surgeon: Rolm Bookbinder, MD;  Location: [facility];  Service: General;  Laterality: N/A;   KNEE SURGERY     POLYPECTOMY     SHOULDER SURGERY Left    surgery to right thumb     ulnar nerve relocation bilaterally     US ECHOCARDIOGRAPHY  06/16/2010   normal     OB History   No obstetric history on file.     Family History  Problem Relation Age of Onset   Melanoma Sister    Seizures Sister    Colon cancer Neg Hx    Colon polyps Neg Hx    Rectal cancer Neg Hx    Stomach cancer Neg Hx     Social History   Tobacco Use   Smoking status:  Some Days    Packs/day: 0.12    Types: Cigarettes    Last attempt to quit: 04/03/2017    Years since quitting: 3.6   Smokeless tobacco: Never  Vaping Use   Vaping Use: Never used  Substance Use Topics   Alcohol use: No    Alcohol/week: 0.0 standard drinks   Drug use: No    Home Medications Prior to Admission medications   Medication Sig Start Date End Date Taking? Authorizing Provider  aspirin EC 81 MG tablet Take 81 mg by mouth daily.    [provider]  atorvastatin (LIPITOR) 20 MG tablet Take 1 tablet by mouth once daily 03/07/19   Croitoru, Mihai, MD  CALCIUM CARBONATE-VITAMIN D PO Take 1 tablet by mouth. 2000 iu     [provider]  clopidogrel (PLAVIX) 75 MG tablet Take 1 tablet (75 mg total) by mouth daily. 01/19/20   Erlene Quan, PA-C  folic acid (FOLVITE) 1 MG tablet Take 1 mg by mouth daily. 06/02/20   [provider]  losartan (COZAAR) 50 MG tablet Take 50 mg by mouth daily.    [provider]  meloxicam (MOBIC) 15 MG tablet Take 15 mg by mouth daily.    [provider]  methotrexate 2.5 MG tablet Take 15 mg by mouth once a week. Sundays 05/31/20   [provider]  predniSONE (DELTASONE) 5 MG tablet Take by mouth. 07/17/20   [provider]  topiramate (TOPAMAX) 25 MG tablet Take 2 tablets (50 mg total) by mouth daily. 07/18/20 07/18/21  Debbora Presto, NP    Allergies    Macrobid [nitrofurantoin macrocrystal], Latex, and Sulfa antibiotics  Review of Systems   Review of Systems  Constitutional: Negative.   HENT: Negative.    Respiratory: Negative.    Cardiovascular: Negative.   Gastrointestinal: Negative.   Musculoskeletal: Negative.   Skin: Negative.   Neurological:  Positive for dizziness, syncope, weakness, light-headedness and headaches.   Physical Exam Updated Vital Signs BP (!) 107/44   Pulse 63   Temp 98.3 F (36.8 C) (Oral)   Resp 15   Ht 5' (1.524 m)   Wt 63.5 kg   SpO2 97%   BMI 27.34 kg/m   Physical Exam Vitals and nursing note reviewed.  Constitutional:      Appearance: She is overweight. She is not ill-appearing or toxic-appearing.  HENT:     Head: Normocephalic. No raccoon eyes or Battle's sign.      Right Ear: Hearing normal.     Left Ear: Hearing normal.     Nose: Nose normal. No congestion.     Mouth/Throat:     Mouth: Mucous membranes are moist.     Pharynx: Oropharynx is clear. Uvula midline. No oropharyngeal exudate or posterior oropharyngeal erythema.     Tonsils: No tonsillar exudate.  Eyes:     General: Lids are normal. Vision grossly intact.        Right eye: No discharge.        Left eye: No  discharge.     Extraocular Movements: Extraocular movements intact.     Conjunctiva/sclera: Conjunctivae normal.     Pupils: Pupils are equal, round, and reactive to light.     Visual Fields: Right eye visual fields normal and left eye visual fields normal.  Neck:     Trachea: Trachea and phonation normal.  Cardiovascular:     Rate and Rhythm: Normal rate and regular rhythm.     Pulses: Normal pulses.  Heart sounds: Normal heart sounds. No murmur heard. Pulmonary:     Effort: Pulmonary effort is normal. No tachypnea, bradypnea, accessory muscle usage, prolonged expiration or respiratory distress.     Breath sounds: Normal breath sounds. No wheezing or rales.  Chest:     Chest wall: No mass, lacerations, deformity, swelling, tenderness, crepitus or edema.  Abdominal:     General: Bowel sounds are normal. There is no distension.     Palpations: Abdomen is soft.     Tenderness: There is no abdominal tenderness. There is no right CVA tenderness, left CVA tenderness, guarding or rebound.  Musculoskeletal:        General: No deformity.     Right shoulder: Normal.     Left shoulder: Normal.     Right upper arm: Normal.     Left upper arm: Normal.     Right elbow: Tenderness present in olecranon process.     Left elbow: Normal.     Right forearm: Normal.     Left forearm: Normal.     Right wrist: Normal.     Left wrist: Normal.     Right hand: Normal.     Left hand: Normal.     Cervical back: Normal range of motion and neck supple. No edema, rigidity, bony tenderness or crepitus. No pain with movement, spinous process tenderness or muscular tenderness.     Thoracic back: Normal. No bony tenderness.     Lumbar back: Normal. No bony tenderness.     Right hip: Normal.     Left hip: Normal.     Right upper leg: Normal.     Left upper leg: Normal.     Right knee: Normal.     Left knee: Normal.     Right lower leg: Normal. No edema.     Left lower leg: Normal. No edema.     Right  ankle: Normal.     Right Achilles Tendon: Normal.     Left ankle: Normal.     Left Achilles Tendon: Normal.     Right foot: Normal.     Left foot: Normal.     Comments: TTP over the olecranon process on the right without deformity. FROM of the elbow   Lymphadenopathy:     Cervical: No cervical adenopathy.  Skin:    General: Skin is warm and dry.     Capillary Refill: Capillary refill takes less than 2 seconds.  Neurological:     General: No focal deficit present.     Mental Status: She is alert and oriented to person, place, and time. Mental status is at baseline.     Cranial Nerves: Cranial nerves are intact.     Sensory: Sensation is intact.     Motor: Motor function is intact.     Coordination: Coordination is intact.     Gait: Gait is intact.  Psychiatric:        Mood and Affect: Mood normal.    ED Results / Procedures / Treatments   Labs (all labs ordered are listed, but only abnormal results are displayed) Labs Reviewed  COMPREHENSIVE METABOLIC PANEL - Abnormal; Notable for the following components:      Result Value   CO2 21 (*)    Calcium 8.7 (*)    AST 14 (*)    All other components within normal limits  CBC WITH DIFFERENTIAL/PLATELET - Abnormal; Notable for the following components:   WBC 13.6 (*)    Neutro Abs 8.4 (*)  Monocytes Absolute 2.0 (*)    Abs Immature Granulocytes 0.58 (*)    All other components within normal limits  URINALYSIS, ROUTINE W REFLEX MICROSCOPIC - Abnormal; Notable for the following components:   Leukocytes,Ua SMALL (*)    All other components within normal limits  URINALYSIS, MICROSCOPIC (REFLEX) - Abnormal; Notable for the following components:   Bacteria, UA FEW (*)    All other components within normal limits  PROTIME-INR  MAGNESIUM  TROPONIN I (HIGH SENSITIVITY)  TROPONIN I (HIGH SENSITIVITY)    EKG EKG Interpretation  Date/Time:  'Sunday November 17 2020 14:56:53 EDT Ventricular Rate:  68 PR Interval:  137 QRS  Duration: 84 QT Interval:  413 QTC Calculation: 440 R Axis:   65 Text Interpretation: Sinus rhythm Abnormal R-wave progression, early transition Confirmed by Long, Joshua ([id number]) on 11/17/2020 2:58:58 PM  Radiology DG Elbow Complete Right  Result Date: 11/17/2020 CLINICAL DATA:  Fall. EXAM: RIGHT ELBOW - COMPLETE 3+ VIEW COMPARISON:  None. FINDINGS: No acute fracture or dislocation. No joint effusion. Small well corticated ossific densities adjacent to the lateral epicondyle, consistent with old trauma. Joint spaces are preserved. Soft tissues are unremarkable. IMPRESSION: 1. No acute osseous abnormality. Electronically Signed   By: William T Derry M.D.   On: 11/17/2020 17:44   CT Head Wo Contrast  Result Date: 11/17/2020 CLINICAL DATA:  Syncope and fall yesterday. EXAM: CT HEAD WITHOUT CONTRAST TECHNIQUE: Contiguous axial images were obtained from the base of the skull through the vertex without intravenous contrast. COMPARISON:  MRI brain dated September 13, 2018. FINDINGS: Brain: No evidence of acute infarction, hemorrhage, hydrocephalus, extra-axial collection or mass lesion/mass effect. Stable mild atrophy. Vascular: Calcified atherosclerosis at the skullbase. No hyperdense vessel. Skull: Normal. Negative for fracture or focal lesion. Sinuses/Orbits: Right maxillary sinus mucosal thickening with frothy secretions. Remaining paranasal sinuses are clear. The orbits are unremarkable. Other: Small right parietal scalp hematoma. IMPRESSION: 1. No acute intracranial abnormality. Small right parietal scalp hematoma. 2. Right maxillary sinus disease. Electronically Signed   By: William T Derry M.D.   On: 11/17/2020 15:58    Procedures Procedures   Medications Ordered in ED Medications  lactated ringers bolus 1,000 mL (0 mLs Intravenous Stopped 11/17/20 1741)  acetaminophen (TYLENOL) tablet 650 mg (650 mg Oral Given 11/17/20 1744)    ED Course  I have reviewed the triage vital signs and the nursing  notes.  Pertinent labs & imaging results that were available during my care of the patient were reviewed by me and considered in my medical decision making (see chart for details).    MDM Rules/Calculators/A&P                         59'$  year old female presents with concern for syncopal episode yesterday.  The differential for syncope is extensive and includes, but is not limited to: arrythmia (Vtach, SVT, SSS, sinus arrest, AV block, bradycardia), aortic stenosis, AMI, hypertrophic obstructive cardiomyopathy (HOCM), PE, atrial myxoma, pulmonary hypertension, orthostatic hypotension, hypovolemia, drug effect, GB syndrome, micturition, cough, carotid sinus sensitivity, seizure, TIA/CVA, hypoglycemia, vertigo.  Vital signs are normal on intake.  Cardiopulmonary exam is normal, normal exam is benign.  Patient is neurovascularly intact in all 4 extremities.  Does have small hematoma to the right parietal scalp without step-off or hematoma.  No focal deficit on neurologic exam. Right elbow with TTP over the olecranon process without deformity or decreased ROM.   Plain film w/o osseous abnormality of the right elbow.  Suspect contusion, patient requesting sling for comfort.  CBC appears mildly hemoconcentrated with 60 doses of 13 and hemoglobin of 14.7, patient's baseline is closer to 11. CMP unremarkable, UA unremarkable, coags are normal.  Magnesium and troponin are normal with troponin of 2.  CT of the head negative for acute intracranial abnormality.  EKG with sinus rhythm with abnormal R wave progression but no overt dysrhythmia.  Patient not orthostatic on review of orthostatic vital signs.  Given reassuring work-up in the emergency department today, do not feel there is a emergent etiology for the symptoms she experienced yesterday.  Cannot rule out underlying cardiac dysrhythmia, there is no evidence of that today.  Do recommend she follow-up closely with her cardiologist.  Given possible  hemoconcentration today she was administered fluid bolus.  No further work-up warranted at this time.  Pella voiced understanding of her medical evaluation and treatment plan.  Each of her questions was answered to her expressed satisfaction.  She voiced understanding of her recommendation to follow-up with her cardiologist.  Strict return precautions are given.  Patient is well-appearing, stable, and appropriate for discharge at this time.  This chart was dictated using voice recognition software, Dragon. Despite the best efforts of this provider to proofread and correct errors, errors may still occur which can change documentation meaning.  Final Clinical Impression(s) / ED Diagnoses Final diagnoses:  None    Rx / DC Orders ED Discharge Orders     None        Emeline Darling, PA-C 11/17/20 [id number] Gypsy Balsam, PA-C 11/17/20 1807    Margette Fast, MD 11/21/20 1517

## 2020-11-17 NOTE — ED Triage Notes (Signed)
Pt reports yesterday feeling like she was going to pass out while at [place]. Next thing she remembers is being in the back of the ambulance. Patient refused transport to the hospital at that time. Pt reports bump to head and right elbow.

## 2020-11-17 NOTE — ED Notes (Signed)
ED Provider at bedside. 

## 2020-11-18 ENCOUNTER — Telehealth: Payer: Self-pay | Admitting: Cardiovascular Disease

## 2020-11-18 MED ORDER — LOSARTAN POTASSIUM 25 MG PO TABS: 25.0000 mg | ORAL_TABLET | Freq: Every day | ORAL | Status: DC

## 2020-11-18 NOTE — Telephone Encounter (Signed)
Pt c/o BP issue: STAT if pt c/o blurred vision, one-sided weakness or slurred speech  1. What are your last 5 BP readings? Has been ranging 103-105/50  2. Are you having any other symptoms (ex. Dizziness, headache, blurred vision, passed out)? Lightheaded  3. What is your BP issue? Patient states her BP has been low and she passed out Saturday at [place]. She says this is the first time she has passed out, but not the first time she felt faint. She says she has felt faint 3-4 times this year. She says she does not remember anything when she fainted and was out for while, because they loaded her on an ambulance.

## 2020-11-18 NOTE — Telephone Encounter (Signed)
Per Dr. Sallyanne Kuster, Continue to hold the losartan until SBP>130, then restart losartan at half of previous dose (25 mg once daily).   Patient agreed to plan, but now she wants to be seen in office, due to having to miss work, syncope and ER telling her to follow up with cardiology. Dr. Sallyanne Kuster has opening on Friday. Made patient an appointment.

## 2020-11-18 NOTE — Telephone Encounter (Signed)
Patient called complaining about having low BP and having syncope over the weekend. Patient was seen in the ER yesterday. They advised to follow up with cardiology. This morning patient's SBP is 140, no heart rate to report. Denies any dizziness, chest pain, or SOB at this time. Patient stated she usually has dizziness when she had low BP and this morning she held Losartan and that has helped. Encouraged patient to drink some fluid and have a small salty snack if her BP is low again. Will send message to Dr. Sallyanne Kuster for advisement.

## 2020-11-22 ENCOUNTER — Telehealth: Payer: Self-pay | Admitting: Cardiovascular Disease

## 2020-11-22 ENCOUNTER — Ambulatory Visit: Payer: BC Managed Care – PPO | Admitting: Cardiovascular Disease

## 2020-11-22 ENCOUNTER — Other Ambulatory Visit: Payer: Self-pay

## 2020-11-22 VITALS — BP 130/60 | HR 71 | Ht 60.0 in | Wt 139.0 lb

## 2020-11-22 DIAGNOSIS — E7800 Pure hypercholesterolemia, unspecified: Secondary | ICD-10-CM

## 2020-11-22 DIAGNOSIS — I150 Renovascular hypertension: Secondary | ICD-10-CM | POA: Diagnosis not present

## 2020-11-22 DIAGNOSIS — Z8673 Personal history of transient ischemic attack (TIA), and cerebral infarction without residual deficits: Secondary | ICD-10-CM | POA: Diagnosis not present

## 2020-11-22 DIAGNOSIS — R55 Syncope and collapse: Secondary | ICD-10-CM | POA: Diagnosis not present

## 2020-11-22 NOTE — Patient Instructions (Signed)
Medication Instructions:   Decrease Losartan 25 mg take 1/2 tablet  ( 12.5 mg ) daily Continue all other medications  *If you need a refill on your cardiac medications before your next appointment, please call your pharmacy*   Lab Work: None ordered   Testing/Procedures: None ordered   Follow-Up: At [facility], you and your health needs are our priority.  As part of our continuing mission to provide you with exceptional heart care, we have created designated Provider Care Teams.  These Care Teams include your primary Cardiologist (physician) and Advanced Practice Providers (APPs -  Physician Assistants and Nurse Practitioners) who all work together to provide you with the care you need, when you need it.  We recommend signing up for the patient portal called "MyChart".  Sign up information is provided on this After Visit Summary.  MyChart is used to connect with patients for Virtual Visits (Telemedicine).  Patients are able to view lab/test results, encounter notes, upcoming appointments, etc.  Non-urgent messages can be sent to your provider as well.   To learn more about what you can do with MyChart, go to [email or web address].     Your next appointment:  Keep appointment Thurs 10/20 at 9:00 am   The format for your next appointment: Office    Provider:  Caron Presume PA    Drink plenty of water

## 2020-11-22 NOTE — Progress Notes (Signed)
Patient ID: Sheri Perkins, female   DOB: 1961-02-26, 60 y.o.   MRN: [id number]    Cardiology Office Note    Date:  11/23/2020   ID:  Sheri Perkins, DOB Feb 19, 1961, MRN [id number]  PCP:  Enid Skeens., MD  Cardiologist:   Sanda Klein, MD   Chief Complaint  Patient presents with   Loss of Consciousness    History of Present Illness:  Sheri Perkins is a 60 y.o. female with a history of a 60-99% right renal artery stenosis, presumed secondary to fibromuscular dysplasia, neurally mediated syncope, previous smoking and mild hyperlipidemia.  For the first time in a few years she has had an episode of syncope.  As before, the presentation was strongly suggestive of vasovagal syncope with a long prodrome.  She had been waiting in a long line in a [place] store, began feeling weak and decided to sit down in a chair, where she lost consciousness for a few minutes.  She did fall and banged her head against the floor and she has a small knot in the right occipital area as well as a knot on her right elbow.  She did not have serious injury.  She woke up promptly while lying flat in the ambulance stretcher and refused to be taken to the hospital.  She was evaluated at [facility] and labs and ECG were benign.  CT of the head did not show any intracranial abnormalities.  Labs did suggest hemoconcentration and she received some intravenous fluids which made her feel better.   Her blood pressure has been trending lower and lower.  I wonder whether this is because she stopped taking meloxicam on a regular basis.  Has been steadily decreasing the dose of losartan and she is now only taking 12.5 mg once daily for the last 2 days.  On this dose, her blood pressure today is in decent range at 130/60.  The patient's mother is concerned that her memory is deteriorating, but this is not readily apparent today.  The other family members do not appear to confirm this.  She is still working  full-time.  She does not have other cardiovascular complaints.  In the past she had an episode suggestive of right hemispheric TIA (numbness and clumsiness in the left upper extremity and left side of her face), no recurrence in approximately 3 years.  Carotid ultrasonography did not show any evidence of obstruction.  She is taking clopidogrel and aspirin.  MRI June 2020 was normal.  She has an aortic ejection murmur.  Echo on March 2019 showed aortic valve sclerosis without stenosis.   Past Medical History:  Diagnosis Date   Acute bronchiolitis    Allergy    Arthritis    fingers , back, neck    Hyperlipidemia    Renal artery stenosis ([place]) 04/03/2011   right 1-59% proximal reduction,left main 1-59% reduction,   Renovascular hypertension    TIA (transient ischemic attack)    Tobacco abuse     Past Surgical History:  Procedure Laterality Date   arm surgery Bilateral    bilateral carpal tunnel surgery     CARPAL TUNNEL RELEASE Bilateral    COLONOSCOPY     FL INJ LEFT KNEE CT ARTHROGRAM ([facility] HX)     HAND SURGERY     INCISION AND DRAINAGE PERIRECTAL ABSCESS N/A 06/11/2020   Procedure: IRRIGATION AND DEBRIDEMENT PERIRECTAL ABSCESS LYTHOTOMY;  Surgeon: Rolm Bookbinder, MD;  Location: [facility];  Service: General;  Laterality: N/A;   KNEE SURGERY     POLYPECTOMY     SHOULDER SURGERY Left    surgery to right thumb     ulnar nerve relocation bilaterally     US ECHOCARDIOGRAPHY  06/16/2010   normal    Outpatient Medications Prior to Visit  Medication Sig Dispense Refill   aspirin EC 81 MG tablet Take 81 mg by mouth daily.     atorvastatin (LIPITOR) 20 MG tablet Take 1 tablet by mouth once daily 90 tablet 1   CALCIUM CARBONATE-VITAMIN D PO Take 1 tablet by mouth. 2000 iu     clopidogrel (PLAVIX) 75 MG tablet Take 1 tablet (75 mg total) by mouth daily. 90 tablet 3   folic acid (FOLVITE) 1 MG tablet Take 1 mg by mouth daily.     losartan (COZAAR) 25 MG tablet Take 1/2 tablet ( 12.5 mg  ) daily 90 tablet 3   predniSONE (DELTASONE) 5 MG tablet Take by mouth.     topiramate (TOPAMAX) 25 MG tablet Take 2 tablets (50 mg total) by mouth daily. 180 tablet 1   losartan (COZAAR) 25 MG tablet Take 1 tablet (25 mg total) by mouth daily.     meloxicam (MOBIC) 15 MG tablet Take 15 mg by mouth daily. (Patient not taking: Reported on 11/22/2020)     methotrexate 2.5 MG tablet Take 15 mg by mouth once a week. Sundays (Patient not taking: Reported on 11/22/2020)     No facility-administered medications prior to visit.     Allergies:   Macrobid [nitrofurantoin macrocrystal], Latex, and Sulfa antibiotics   Social History   Socioeconomic History   Marital status: Married    Spouse name: Not on file   Number of children: Not on file   Years of education: Not on file   Highest education level: Not on file  Occupational History   Not on file  Tobacco Use   Smoking status: Some Days    Packs/day: 0.12    Types: Cigarettes    Last attempt to quit: 04/03/2017    Years since quitting: 3.6   Smokeless tobacco: Never  Vaping Use   Vaping Use: Never used  Substance and Sexual Activity   Alcohol use: No    Alcohol/week: 0.0 standard drinks   Drug use: No   Sexual activity: Not on file  Other Topics Concern   Not on file  Social History Narrative   Not on file   Social Determinants of Health   Financial Resource Strain: Not on file  Food Insecurity: Not on file  Transportation Needs: Not on file  Physical Activity: Not on file  Stress: Not on file  Social Connections: Not on file     Family History:  The patient's mother has coronary artery disease status post stent, obstructive sleep apnea, hyperlipidemia  ROS:   Please see the history of present illness.    ROS  All other systems are reviewed and are negative.   PHYSICAL EXAM:   VS:  BP 130/60   Pulse 71   Ht 5' (1.524 m)   Wt 139 lb (63 kg)   SpO2 99%   BMI 27.15 kg/m       General: Alert, oriented x3, no  distress, Head: Small occipital hematoma, PERRL, EOMI, no exophtalmos or lid lag, no myxedema, no xanthelasma; normal ears, nose and oropharynx Neck: normal jugular venous pulsations and no hepatojugular reflux; brisk carotid pulses without delay and no carotid bruits Chest: clear to auscultation,  no signs of consolidation by percussion or palpation, normal fremitus, symmetrical and full respiratory excursions Cardiovascular: normal position and quality of the apical impulse, regular rhythm, normal first and second heart sounds, no murmurs, rubs or gallops Abdomen: no tenderness or distention, no masses by palpation, no abnormal pulsatility or arterial bruits, normal bowel sounds, no hepatosplenomegaly Extremities: no clubbing, cyanosis or edema; 2+ radial, ulnar and brachial pulses bilaterally; 2+ right femoral, posterior tibial and dorsalis pedis pulses; 2+ left femoral, posterior tibial and dorsalis pedis pulses; no subclavian or femoral bruits Neurological: grossly nonfocal Psych: Normal mood and affect    Wt Readings from Last 3 Encounters:  11/22/20 139 lb (63 kg)  11/17/20 140 lb (63.5 kg)  07/18/20 140 lb (63.5 kg)      Studies/Labs Reviewed:   EKG:  EKG from 08 31 reviewed.  It shows sinus rhythm with early RS transition but is otherwise normal.  Recent Labs:  Lipid Panel     Component Value Date/Time   CHOL 126 08/25/2018 1345   TRIG 90 08/25/2018 1345   HDL 47 08/25/2018 1345   CHOLHDL 2.7 08/25/2018 1345   CHOLHDL 4.1 05/10/2015 0910   VLDL 15 05/10/2015 0910   LDLCALC 61 08/25/2018 1345   BMET    Component Value Date/Time   NA 136 11/17/2020 1512   NA 144 01/28/2018 0946   K 3.5 11/17/2020 1512   CL 104 11/17/2020 1512   CO2 21 (L) 11/17/2020 1512   GLUCOSE 97 11/17/2020 1512   BUN 20 11/17/2020 1512   BUN 11 01/28/2018 0946   CREATININE 0.85 11/17/2020 1512   CREATININE 0.64 05/10/2015 0910   CALCIUM 8.7 (L) 11/17/2020 1512   GFRNONAA >60 11/17/2020  1512   GFRAA 112 01/28/2018 0946     ASSESSMENT:    1. Neurocardiogenic syncope   2. History of TIA (transient ischemic attack)   3. Renovascular hypertension   4. Pure hypercholesterolemia       PLAN:  In order of problems listed above:  Neurally mediated syncope: This is the first episode she has had in a long time.  Reminded her about the need to stay very well-hydrated.  She should avoid any known triggers.  Most importantly if she has prodromal symptoms she should immediately lay flat to avoid syncope, falls, injuries. TIA: No recent occurrences.  Her syncopal event does not not sound at all like an ischemic insult.  Continue aspirin and Plavix. Renal artery stenosis/renovascular hypertension: Recently her blood pressure has been trending lower and lower.  I think this is because she is no longer taking NSAIDs.  We have cut her losartan down to 12.5 mg once daily and can consider discontinuing it altogether if her blood pressure remains normal. HLP: On statin, target LDL less than 70.     Medication Adjustments/Labs and Tests Ordered: Current medicines are reviewed at length with the patient today.  Concerns regarding medicines are outlined above.  Medication changes, Labs and Tests ordered today are listed in the Patient Instructions below. Patient Instructions  Medication Instructions:   Decrease Losartan 25 mg take 1/2 tablet  ( 12.5 mg ) daily Continue all other medications  *If you need a refill on your cardiac medications before your next appointment, please call your pharmacy*   Lab Work: None ordered   Testing/Procedures: None ordered   Follow-Up: At [facility], you and your health needs are our priority.  As part of our continuing mission to provide you with exceptional heart care,  we have created designated Provider Care Teams.  These Care Teams include your primary Cardiologist (physician) and Advanced Practice Providers (APPs -  Physician Assistants  and Nurse Practitioners) who all work together to provide you with the care you need, when you need it.  We recommend signing up for the patient portal called "MyChart".  Sign up information is provided on this After Visit Summary.  MyChart is used to connect with patients for Virtual Visits (Telemedicine).  Patients are able to view lab/test results, encounter notes, upcoming appointments, etc.  Non-urgent messages can be sent to your provider as well.   To learn more about what you can do with MyChart, go to [email or web address].     Your next appointment:  Keep appointment Thurs 10/20 at 9:00 am   The format for your next appointment: Office    Provider:  Caron Presume PA    Drink plenty of water      Signed, Sanda Klein, MD  11/23/2020 7:03 PM    [facility] [place], [place], [place]  [address] Phone: [phone]; Fax: [phone]

## 2020-11-22 NOTE — Telephone Encounter (Signed)
The patient's mother has been made aware that she is not on the dpr so we could not speak to her unless the patient approves.

## 2020-11-22 NOTE — Telephone Encounter (Signed)
Patient's mother is requesting to speak with Dr. Victorino December nurse regarding the patient's condition. She states she would like to make him aware of a few things before she is seen. Her appointment is scheduled for today, 9/02 at 11:30 AM.

## 2020-11-23 ENCOUNTER — Encounter: Payer: Self-pay | Admitting: Cardiovascular Disease

## 2020-11-27 DIAGNOSIS — Z0279 Encounter for issue of other medical certificate: Secondary | ICD-10-CM

## 2020-11-28 ENCOUNTER — Telehealth: Payer: Self-pay | Admitting: Family Medicine

## 2020-11-28 NOTE — Telephone Encounter (Signed)
  If pt calls back please assist with a sooner appt or add to cancellation list for sooner work in.    I called the pt, spoke with her husband (ok per dpr). He sts the pt was not home was at work and does not have a mobile number.  He sts 1 week [place] [employer] [place]. Pt was checked out by EMS and went to the ER the next day. Suspicious cardiac etiology noted by ED and was advised to follow up with Cardiologist ( pending appt for 01/09/21). CT was checked in the ED ( see study from 11/17/20).  I advised pt's husband we could try and get her worked in sooner if she would like since h/a are lingering pt on the books for 01/23/21. Phone staff can assist with trying to get her worked in sooner.

## 2020-11-28 NOTE — Telephone Encounter (Signed)
Pt called me back and we discussed. Pt has been added to wait list for AL, NP. To note Dr. Leonie Man is the primary neurologist and could maybe see him as well.

## 2020-11-28 NOTE — Telephone Encounter (Signed)
Pt called, having headaches from a fall. Would like a call from the nurse.

## 2020-12-10 ENCOUNTER — Telehealth: Payer: Self-pay | Admitting: Cardiovascular Disease

## 2020-12-10 NOTE — Telephone Encounter (Signed)
12/10/20 Received forms from provider, forms were faxed to [employer], Left message for patient with update on forms. Email was sent to [employer].   11/27/20 FMLA forms from [employer] were put in Dr.C mailbox.

## 2020-12-10 NOTE — Telephone Encounter (Signed)
  Are you calling in reference to your FMLA or disability form? yes  What is your question in regards to FMLA or disability form? Calling to follow up on FMLA paper work   Do you need copies of your medical records? no  Are you waiting on a nurse to call you back with results or are you wanting copies of your results? no  Patient states her work only gave her 15 days to get her FMLA paper work. She says they have not received it and she now only has 7 days.   Please route to Medical Records or your medical records site representative

## 2021-01-08 NOTE — Progress Notes (Signed)
Cardiology Office Note:    Date:  01/08/2021   ID:  Sheri Perkins, DOB April 04, 1960, MRN [id number]  PCP:  Sheri Skeens., MD [place] Cardiologist: Sheri Klein, MD   Reason for visit: Follow-up syncope  History of Present Illness:    Sheri Perkins is a 60 y.o. female with a hx of 60-99% right renal artery stenosis, presumed secondary to fibromuscular dysplasia, neurally mediated syncope, previous smoking and mild hyperlipidemia.    Return saw her November 22 2020  and noted that she had been waiting in a long line in a [place] store, began feeling weak and decided to sit down in a chair, where she lost consciousness for a few minutes.  She did not have serious injury.  She woke up promptly while lying flat in the ambulance stretcher and refused to be taken to the hospital.  She was evaluated at [facility].  CT of the head did not show any intracranial abnormalities.  Labs did suggest hemoconcentration and she received some intravenous fluids which made her feel better.  Dr. Sallyanne Kuster recognized that her blood pressure was lower after stopping meloxicam.  He cut her losartan down to 12.5 mg once daily.    Today she states since her blood pressure stayed 90s over 50s, she eventually stopped losartan.  Her main complaint of is residual head pain from her fall and head injury.  It is very sensitive to touch.  She has a an appointment for neurology coming up.  Her blood pressure log shows systolic blood pressure 831D to 120s on average.  She lives with her husband and she is the guardian of her nieces' 3 children.  They are 55, 61 and 78 years old.  Her husband is disabled so he does the majority of cooking and household responsibilities.  She works [employer].  She wakes up around 2-2:30am in the morning to get ready for work.  She wonders why she is so tired.  She is typically getting around 5 hours of sleep per night.  Otherwise, she denies chest pain,  shortness of breath, PND, orthopnea, lower extremity edema, palpitations and presyncope.  She has had no further TIA symptoms.  With her previous TIA she felt right-sided facial droop and slurred speech.  She states she had a cholesterol checked at work yesterday and it was great.   Past Medical History:  Diagnosis Date   Acute bronchiolitis    Allergy    Arthritis    fingers , back, neck    Hyperlipidemia    Renal artery stenosis ([place]) 04/03/2011   right 1-59% proximal reduction,left main 1-59% reduction,   Renovascular hypertension    TIA (transient ischemic attack)    Tobacco abuse     Past Surgical History:  Procedure Laterality Date   arm surgery Bilateral    bilateral carpal tunnel surgery     CARPAL TUNNEL RELEASE Bilateral    COLONOSCOPY     FL INJ LEFT KNEE CT ARTHROGRAM ([facility] HX)     HAND SURGERY     INCISION AND DRAINAGE PERIRECTAL ABSCESS N/A 06/11/2020   Procedure: IRRIGATION AND DEBRIDEMENT PERIRECTAL ABSCESS LYTHOTOMY;  Surgeon: Rolm Bookbinder, MD;  Location: [facility];  Service: General;  Laterality: N/A;   KNEE SURGERY     POLYPECTOMY     SHOULDER SURGERY Left    surgery to right thumb     ulnar nerve relocation bilaterally     US ECHOCARDIOGRAPHY  06/16/2010  normal    Current Medications: No outpatient medications have been marked as taking for the 01/09/21 encounter (Appointment) with Warren Lacy, PA-C.     Allergies:   Macrobid [nitrofurantoin macrocrystal], Latex, and Sulfa antibiotics   Social History   Socioeconomic History   Marital status: Married    Spouse name: Not on file   Number of children: Not on file   Years of education: Not on file   Highest education level: Not on file  Occupational History   Not on file  Tobacco Use   Smoking status: Some Days    Packs/day: 0.12    Types: Cigarettes    Last attempt to quit: 04/03/2017    Years since quitting: 3.7   Smokeless tobacco: Never  Vaping Use   Vaping Use: Never used   Substance and Sexual Activity   Alcohol use: No    Alcohol/week: 0.0 standard drinks   Drug use: No   Sexual activity: Not on file  Other Topics Concern   Not on file  Social History Narrative   Not on file   Social Determinants of Health   Financial Resource Strain: Not on file  Food Insecurity: Not on file  Transportation Needs: Not on file  Physical Activity: Not on file  Stress: Not on file  Social Connections: Not on file     Family History: The patient's family history includes Melanoma in her sister; Seizures in her sister. There is no history of Colon cancer, Colon polyps, Rectal cancer, or Stomach cancer.  ROS:   Please see the history of present illness.     EKGs/Labs/Other Studies Reviewed:    EKG:  The ekg ordered today demonstrates sinus rhythm, heart rate 62, possible left atrial enlargement.  No change from prior.  Recent Labs: 11/17/2020: ALT 13; BUN 20; Creatinine, Ser 0.85; Hemoglobin 14.7; Magnesium 2.2; Platelets 256; Potassium 3.5; Sodium 136   Recent Lipid Panel Lab Results  Component Value Date/Time   CHOL 126 08/25/2018 01:45 PM   TRIG 90 08/25/2018 01:45 PM   HDL 47 08/25/2018 01:45 PM   LDLCALC 61 08/25/2018 01:45 PM    Physical Exam:    VS:  There were no vitals taken for this visit.   No data found.  Wt Readings from Last 3 Encounters:  11/22/20 139 lb (63 kg)  11/17/20 140 lb (63.5 kg)  07/18/20 140 lb (63.5 kg)     GEN:  Well nourished, well developed in no acute distress HEENT: Normal NECK: No JVD; No carotid bruits CARDIAC: RRR, II/VI systolic murmurs at L/RUSB, no rubs, gallops RESPIRATORY:  Clear to auscultation without rales, wheezing or rhonchi  ABDOMEN: Soft, non-tender, non-distended MUSCULOSKELETAL: No edema; No deformity  SKIN: Warm and dry NEUROLOGIC:  Alert and oriented PSYCHIATRIC:  Normal affect     ASSESSMENT AND PLAN   Syncope, no recurrence -Blood pressure normal off losartan. -Continue hydration. If  prodromal symptoms she should immediately lay flat to avoid syncope, falls, injuries. -Gave information about concussion symptoms.  Follow-up with neurology.  History of TIA  -Carotid duplex 01/2018 with mild plaque.  Holter monitor 01/2018 with no A. fib. -Continue aspirin & Plavix, statin therapy -Reviewed symptoms of stroke.  Renovascular hypertension -Renal artery duplex 2014 suggestive of moderate right artery stenosis 2/2 FMD.  Echo 2019 with normal EF. -Blood pressure well controlled off medications. -Continue to monitor blood pressure twice a week. -Goal BP is <130/80.  Recommend DASH diet (high in vegetables, fruits, low-fat dairy products,  whole grains, poultry, fish, and nuts and low in sweets, sugar-sweetened beverages, and red meats), salt restriction and increase physical activity.  Hyperlipidemia -LDL 61 in June 2020.  Asked her to send [place] her last lipid results. -Continue Lipitor. -Discussed cholesterol lowering diets - Mediterranean diet, DASH diet, vegetarian diet, low-carbohydrate diet and avoidance of trans fats.  Discussed healthier choice substitutes.  Nuts, high-fiber foods, and fiber supplements may also improve lipids.    Tobacco use  -She states that she smokes occasionally at work.   -Recommend tobacco cessation.  Reviewed physiologic effects of nicotine and the immediate-eventual benefits of quitting including improvement in cough/breathing and reduction in cardiovascular events.  Discussed quitting tips such as removing triggers and getting support from family/friends and Quitline [place].  Disposition - Follow-up in 6 months to follow-up of blood pressure and lipids.    Medication Adjustments/Labs and Tests Ordered: Current medicines are reviewed at length with the patient today.  Concerns regarding medicines are outlined above.  No orders of the defined types were placed in this encounter.  No orders of the defined types were placed in this  encounter.   There are no Patient Instructions on file for this visit.   Signed, Warren Lacy, PA-C  01/08/2021 12:52 PM    [facility]

## 2021-01-09 ENCOUNTER — Ambulatory Visit: Payer: BC Managed Care – PPO | Admitting: Physician Assistant

## 2021-01-09 ENCOUNTER — Other Ambulatory Visit: Payer: Self-pay

## 2021-01-09 ENCOUNTER — Encounter: Payer: Self-pay | Admitting: Physician Assistant

## 2021-01-09 VITALS — BP 124/82 | HR 62 | Ht 60.0 in | Wt 141.0 lb

## 2021-01-09 DIAGNOSIS — R55 Syncope and collapse: Secondary | ICD-10-CM | POA: Diagnosis not present

## 2021-01-09 DIAGNOSIS — Z8673 Personal history of transient ischemic attack (TIA), and cerebral infarction without residual deficits: Secondary | ICD-10-CM | POA: Diagnosis not present

## 2021-01-09 DIAGNOSIS — I150 Renovascular hypertension: Secondary | ICD-10-CM | POA: Diagnosis not present

## 2021-01-09 DIAGNOSIS — E7800 Pure hypercholesterolemia, unspecified: Secondary | ICD-10-CM | POA: Diagnosis not present

## 2021-01-09 NOTE — Patient Instructions (Signed)
Medication Instructions:  Stop Losartan *If you need a refill on your cardiac medications before your next appointment, please call your pharmacy*   Lab Work: No Labs If you have labs (blood work) drawn today and your tests are completely normal, you will receive your results only by: [place] (if you have MyChart) OR A paper copy in the mail If you have any lab test that is abnormal or we need to change your treatment, we will call you to review the results.   Testing/Procedures: No Testing   Follow-Up: At [facility], you and your health needs are our priority.  As part of our continuing mission to provide you with exceptional heart care, we have created designated Provider Care Teams.  These Care Teams include your primary Cardiologist (physician) and Advanced Practice Providers (APPs -  Physician Assistants and Nurse Practitioners) who all work together to provide you with the care you need, when you need it.  We recommend signing up for the patient portal called "MyChart".  Sign up information is provided on this After Visit Summary.  MyChart is used to connect with patients for Virtual Visits (Telemedicine).  Patients are able to view lab/test results, encounter notes, upcoming appointments, etc.  Non-urgent messages can be sent to your provider as well.   To learn more about what you can do with MyChart, go to [email or web address].    Your next appointment:   6 month(s)  The format for your next appointment:   In Person  Provider:   Sanda Klein, MD or Caron Presume, PA-C   Other Instructions Take Blood Pressure Twice a Week. Blood Pressure Goal Less Than 130/80

## 2021-01-22 ENCOUNTER — Encounter: Payer: Self-pay | Admitting: Family Medicine

## 2021-01-22 ENCOUNTER — Telehealth: Payer: Self-pay | Admitting: Family Medicine

## 2021-01-22 ENCOUNTER — Ambulatory Visit: Payer: BC Managed Care – PPO | Admitting: Family Medicine

## 2021-01-22 VITALS — BP 145/83 | HR 75 | Ht 60.0 in | Wt 144.6 lb

## 2021-01-22 DIAGNOSIS — G459 Transient cerebral ischemic attack, unspecified: Secondary | ICD-10-CM | POA: Diagnosis not present

## 2021-01-22 DIAGNOSIS — I150 Renovascular hypertension: Secondary | ICD-10-CM

## 2021-01-22 DIAGNOSIS — G44209 Tension-type headache, unspecified, not intractable: Secondary | ICD-10-CM | POA: Diagnosis not present

## 2021-01-22 DIAGNOSIS — R55 Syncope and collapse: Secondary | ICD-10-CM | POA: Diagnosis not present

## 2021-01-22 MED ORDER — UBRELVY 100 MG PO TABS: 100.0000 mg | ORAL_TABLET | Freq: Every day | ORAL | 11 refills | Status: DC | PRN

## 2021-01-22 NOTE — Telephone Encounter (Signed)
Pt called, was told by the pharmacy Ubrogepant (UBRELVY) 100 MG TABS was sent to the wrong pharmacy. Need prescription resent to  [facility]. Would like a call from the nurse.

## 2021-01-22 NOTE — Telephone Encounter (Signed)
I called pt and provided update, she verbalized understanding.

## 2021-01-22 NOTE — Telephone Encounter (Signed)
[facility], they are closed. I LVM asking they cx rx Ubrelvy sent in. E-scribed rx to [facility] instead per pt request.

## 2021-01-22 NOTE — Patient Instructions (Signed)
Below is our plan:  We will continue topiramate 50mg  daily. We will staret Ubrelvy for migraine abortion. Please take 1 tablet at onset of headache. May take 1 additional tablet in 2 hours if needed. Do not take more than 2 tablets in 24 hours or more than 8 in a month. Continue close follow up with PCP and cardiology.   Please make sure you are staying well hydrated. I recommend 50-60 ounces daily. Well balanced diet and regular exercise encouraged. Consistent sleep schedule with 6-8 hours recommended.   Please continue follow up with care team as directed.   Follow up with me in 1 year   You may receive a survey regarding today's visit. I encourage you to leave honest feed back as I do use this information to improve patient care. Thank you for seeing me today!

## 2021-01-22 NOTE — Progress Notes (Signed)
Chief Complaint  Patient presents with   Follow-up    New rm, alone.  Here for tension HA f/u. Pt reports passing out on 8/28 and was seen at  [place] in [place] the follow day. Pt reports pn in soft spots where she hit the floor.      HISTORY OF PRESENT ILLNESS: 01/22/21 ALL:  Genni returns for follow up for worsening headaches. She was restarted on topiramate 50mg  daily at last follow up 06/2020 (been off for several months prior d/t hospitalization). After restarting topiramate, headaches improved. She has about 8 headache days a month with 2 or so being migrainous. She was using Tylenol or ibuprofen   She reports a syncope with fall in 11/16/2020. CT was normal. Vasovagal event suspected. She reported lower BP readings and labs concerning for hemoconcentration. IV fluids made her feel better. She has renovascular hypertension. She reports BP had been running low. Losartan was discontinued. She is now drinking at least 40 ounces of water daily. She continues to have periods of time when she feels that she may pass out.No actual syncope. She can be standing up for a period of time and feel it coming on or she may be sitting and it occur. She has a long standing history of neurocardiogenic syncope. No obvious triggers. She is scheduled with PCP for follow up next month.   07/18/2020 ALL: She returns for follow up for headaches. She reports taking topiramate until around 05/2020 when hospitalized for cellulitis of perineum. She did not continue following discharge. She feels it was helping when taking regularly. She has stopped taking Tylenol and ibuprofen as she was recently started on prednisone and meloxicam for arthritis. She continues close follow up with PCP. BP is well managed. She continues Plavix and asa.   04/03/2020 ALL:  LENNI RECKNER is a 60 y.o. female here today for follow up for headaches. She was last seen by Dr Leonie Man in 10/2018 when topiramate was increased to 50mg  BID. She  reports that headaches are better. She may 8-10 headaches a month. She rarely has severe symptoms. Occasionally has nausea but feels this is triggered by smell of solvents used at work. She is having to wear a mask at work and feels this contributed. She alternates Tylenol and ibuprofen. She usually takes either 4 tablets of Tylenol or 4 tablets of ibuprofen everyday before work (5 days a week).   She reports that she has had more dizziness over the past two months. She has had 3 falls in the period of time. She had a event last week when she woke up around 2:30 to get ready for work. She raised out of the bed and stood up then felt dizzy. She had to grab a piece of furniture to keep from falling. Another episode was when she was sitting in the floor. She stood up and walked into another room and felt dizzy. Dizziness resolves after a few seconds. She reports that she can experience the dizziness when walking as well. She fell while coming down from a latter to the attack and missed the last step. The other two falls were when she was in a squatting and fell backwards. She reports recent issues with her blood pressures. Losartan was increased last year. BP had been "running high". She was at work on 12/30 and states that she "didn't feel right". She felt lightheaded. The nurse checked her blood pressure at work and reading was 85/57. She tried to stay at  work but continued to feel unbalanced. She had to go home. She called her cardiologist and he reduced Losartan to 50mg . She has felt better since. She was seen for follow up with cardiology yesterday and reports that reading were good.   She does have a history of TIA.  She continues regular follow up with PCP for stroke prevention and management of comorbidities. She continues aspirin 81mg  and atorvastatin 20mg  daily. She had stopped Plavix but recently advised to resume by cardiology. She reports last cigarette was 03/19/2020. She seems to be doing well with  smoking cessation.   HISTORY (copied from previous note)  11/03/2018 PS: HPI: Initial visit 08/25/2018:Ms Igoe is a 60 year old pleasant Caucasian lady seen today for initial office consultation visit for TIA and chronic headaches.  History is obtained from the patient and review of referral notes.  She states that sometime in January or February she had a brief episode of sudden onset of left face arm and leg tingling and numbness.  This lasted about 30 minutes.  She had a mild headache which was not disabling.  She did not have any significant weakness and was able to walk fine at that time.  She did not have any slurred speech.  She checked her blood pressure at that time it was fine.  She did not seek medical help right away.  She mentioned this several weeks later with a primary care visit.  She did see a cardiologist in March who ordered carotid ultrasound which I have personally reviewed showed no significant extracranial stenosis as well as 30-day heart monitor which was negative for significant arrhythmias.  Patient has not had any recurrent TIA or stroke symptoms.  She denies slurred speech, extremity weakness, gait or balance problems.  Review of her electronic medical records show that she had an MRI scan of the brain and MRA of the brain which was normal in 2006 at that time she had episode of dizziness lightheadedness and passing out.  The patient also complains of her chronic daily headaches which she is had for 2 years.  She describes a headache present mostly bifrontally dull in 6/10 in severity occasionally throbbing.  The headaches are noticed during the day after going to work every day.  She has been taking now 4 tablets of Tylenol every day prior to going to work in the past she was taking 4 tablets of ibuprofen.  She has not tried any medication for headache prophylaxis.  She does complain of tightness of the muscles of her neck.  She does not do any regular neck stretching exercises.  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  She has no prior history of strokes or TIAs.  She does smoke 1 pack/day.  Update 11/03/2018 : She returns for follow-up after last visit 2 months ago.  She states her headaches are improved but they are still occurring 3 times a week or so.  They are much milder.  She takes Tylenol which helps.  She is tolerating Topamax 25 mg twice daily quite well without any side effects.  She is on aspirin 81 mg daily which is tolerating well.  Her blood pressure usually is better controlled today it is elevated in the office at 189/81.  She has no new complaints.  She did undergo MRI scan of the brain  on 09/15/2018 which was unremarkable.  She admits to being under significant stress since she is bringing up 3 of her nieces children   REVIEW OF SYSTEMS: Out of a complete 14 system review of symptoms, the patient complains only of the following symptoms, headaches, syncope, arthritis and all other reviewed systems are negative.   ALLERGIES: Allergies  Allergen Reactions   Macrobid [Nitrofurantoin Macrocrystal] Other (See Comments)    "burned lungs"   Latex Itching        Sulfa Antibiotics Hives and Rash     HOME MEDICATIONS: Outpatient Medications Prior to Visit  Medication Sig Dispense Refill   ALPRAZolam (XANAX) 0.5 MG tablet Take 0.5 mg by mouth 2 (two) times daily as needed.     aspirin EC 81 MG tablet Take 81 mg by mouth daily.     atorvastatin (LIPITOR) 20 MG tablet Take 1 tablet by mouth once daily 90 tablet 1   CALCIUM CARBONATE-VITAMIN D PO Take 1 tablet by mouth. 2000 iu     clopidogrel (PLAVIX) 75 MG tablet Take 1 tablet (75 mg total) by mouth daily. 90 tablet 3   folic acid (FOLVITE) 1 MG tablet Take 1 mg by mouth daily.     meloxicam (MOBIC) 15 MG tablet Take 15 mg by mouth daily.     topiramate (TOPAMAX) 25 MG  tablet Take 2 tablets (50 mg total) by mouth daily. 180 tablet 1   No facility-administered medications prior to visit.     PAST MEDICAL HISTORY: Past Medical History:  Diagnosis Date   Acute bronchiolitis    Allergy    Arthritis    fingers , back, neck    Hyperlipidemia    Renal artery stenosis ([place]) 04/03/2011   right 1-59% proximal reduction,left main 1-59% reduction,   Renovascular hypertension    TIA (transient ischemic attack)    Tobacco abuse      PAST SURGICAL HISTORY: Past Surgical History:  Procedure Laterality Date   arm surgery Bilateral    bilateral carpal tunnel surgery     CARPAL TUNNEL RELEASE Bilateral    COLONOSCOPY     FL INJ LEFT KNEE CT ARTHROGRAM ([facility] HX)     HAND SURGERY     INCISION AND DRAINAGE PERIRECTAL ABSCESS N/A 06/11/2020   Procedure: IRRIGATION AND DEBRIDEMENT PERIRECTAL ABSCESS LYTHOTOMY;  Surgeon: Rolm Bookbinder, MD;  Location: [facility];  Service: General;  Laterality: N/A;   KNEE SURGERY     POLYPECTOMY     SHOULDER SURGERY Left    surgery to right thumb     ulnar nerve relocation bilaterally     US ECHOCARDIOGRAPHY  06/16/2010   normal     FAMILY HISTORY: Family History  Problem Relation Age of Onset   Melanoma Sister    Seizures Sister    Colon cancer Neg Hx    Colon polyps Neg Hx    Rectal cancer Neg Hx    Stomach cancer Neg Hx      SOCIAL HISTORY: Social History   Socioeconomic History   Marital status: Married    Spouse name: Not on file   Number of children: Not on file   Years of education: Not on file   Highest education level: Not on file  Occupational History   Not on file  Tobacco Use   Smoking status: Some Days    Packs/day: 0.12    Types: Cigarettes    Last attempt to quit: 04/03/2017    Years since quitting: 3.8  Smokeless tobacco: Never  Vaping Use   Vaping Use: Never used  Substance and Sexual Activity   Alcohol use: No    Alcohol/week: 0.0 standard drinks   Drug use: No   Sexual  activity: Not on file  Other Topics Concern   Not on file  Social History Narrative   Not on file   Social Determinants of Health   Financial Resource Strain: Not on file  Food Insecurity: Not on file  Transportation Needs: Not on file  Physical Activity: Not on file  Stress: Not on file  Social Connections: Not on file  Intimate Partner Violence: Not on file      PHYSICAL EXAM  Vitals:   01/22/21 1406  BP: (!) 145/83  Pulse: 75  Weight: 144 lb 9.6 oz (65.6 kg)  Height: 5' (1.524 m)    Body mass index is 28.24 kg/m.  No data found.   Generalized: Well developed, in no acute distress  Cardiology: normal rate and rhythm, no murmur auscultated  Respiratory: clear to auscultation bilaterally    Neurological examination  Mentation: Alert oriented to time, place, history taking. Follows all commands speech and language fluent Cranial nerve II-XII: Pupils were equal round reactive to light. Extraocular movements were full, visual field were full on confrontational test. Facial sensation and strength were normal. Head turning and shoulder shrug  were normal and symmetric. No nystagmus. Motor: The motor testing reveals 5 over 5 strength of all 4 extremities. Good symmetric motor tone is noted throughout.   Gait and station: Gait is normal.    DIAGNOSTIC DATA (LABS, IMAGING, TESTING) - I reviewed patient records, labs, notes, testing and imaging myself where available.  Lab Results  Component Value Date   WBC 13.6 (H) 11/17/2020   HGB 14.7 11/17/2020   HCT 42.4 11/17/2020   MCV 97.9 11/17/2020   PLT 256 11/17/2020      Component Value Date/Time   NA 136 11/17/2020 1512   NA 144 01/28/2018 0946   K 3.5 11/17/2020 1512   CL 104 11/17/2020 1512   CO2 21 (L) 11/17/2020 1512   GLUCOSE 97 11/17/2020 1512   BUN 20 11/17/2020 1512   BUN 11 01/28/2018 0946   CREATININE 0.85 11/17/2020 1512   CREATININE 0.64 05/10/2015 0910   CALCIUM 8.7 (L) 11/17/2020 1512   PROT  6.9 11/17/2020 1512   PROT 6.8 01/28/2018 0946   ALBUMIN 3.9 11/17/2020 1512   ALBUMIN 4.1 01/28/2018 0946   AST 14 (L) 11/17/2020 1512   ALT 13 11/17/2020 1512   ALKPHOS 93 11/17/2020 1512   BILITOT 0.7 11/17/2020 1512   BILITOT 0.4 01/28/2018 0946   GFRNONAA >60 11/17/2020 1512   GFRAA 112 01/28/2018 0946   Lab Results  Component Value Date   CHOL 126 08/25/2018   HDL 47 08/25/2018   LDLCALC 61 08/25/2018   TRIG 90 08/25/2018   CHOLHDL 2.7 08/25/2018   Lab Results  Component Value Date   HGBA1C 5.4 08/25/2018   No results found for: VITAMINB12 No results found for: TSH  No flowsheet data found.   ASSESSMENT AND PLAN  60 y.o. year old female  has a past medical history of Acute bronchiolitis, Allergy, Arthritis, Hyperlipidemia, Renal artery stenosis ([place]) (04/03/2011), Renovascular hypertension, TIA (transient ischemic attack), and Tobacco abuse. here with   Tension headache  TIA (transient ischemic attack)  Renovascular hypertension  Vasovagal syncope  Mashell is doing very well from a headache perspective. She will continue topiramate 50mg  daily. I  will start her on Ubrelvy for abortive therapy. Appropriate administration and side effects reviewed. She will continue close follow up with cardiology for syncopal episodes. She was encouraged to continue working on staying well hydrated and monitoring BP.  I have encouraged her to follow-up closely with primary care for stroke prevention and updated labs.  She will continue Plavix and aspirin as well as atorvastatin for stroke prevention. I will have her follow-up with me in 1 year, sooner if needed. She verbalizes understanding and agreement with this plan.   No orders of the defined types were placed in this encounter.    Debbora Presto, MSN, FNP-C 01/22/2021, 2:17 PM  [facility] [address], [place], [place] [address] [phone]

## 2021-01-22 NOTE — Telephone Encounter (Signed)
Payment processed. Complete

## 2021-01-23 ENCOUNTER — Ambulatory Visit: Payer: BC Managed Care – PPO | Admitting: Family Medicine

## 2021-01-23 ENCOUNTER — Telehealth: Payer: Self-pay | Admitting: Neurology

## 2021-01-23 NOTE — Telephone Encounter (Signed)
PA completed for the patient through [facility] [id number] Will await response

## 2021-01-27 ENCOUNTER — Other Ambulatory Visit: Payer: Self-pay

## 2021-01-29 NOTE — Telephone Encounter (Signed)
DENIED:due to no documentation of a trial of and inadequate response or intolerance to Nurtec ODT. If we receive these records, we may need more information (if there is record that you have certain illness; other drugs that you are taking; how well your kidneys are working).

## 2021-02-07 DIAGNOSIS — R5382 Chronic fatigue, unspecified: Secondary | ICD-10-CM | POA: Diagnosis not present

## 2021-02-07 DIAGNOSIS — M2550 Pain in unspecified joint: Secondary | ICD-10-CM | POA: Diagnosis not present

## 2021-02-07 DIAGNOSIS — Z79899 Other long term (current) drug therapy: Secondary | ICD-10-CM | POA: Diagnosis not present

## 2021-02-07 DIAGNOSIS — M0579 Rheumatoid arthritis with rheumatoid factor of multiple sites without organ or systems involvement: Secondary | ICD-10-CM | POA: Diagnosis not present

## 2021-02-28 ENCOUNTER — Telehealth: Payer: Self-pay | Admitting: Cardiovascular Disease

## 2021-02-28 MED ORDER — CLOPIDOGREL BISULFATE 75 MG PO TABS: 75.0000 mg | ORAL_TABLET | Freq: Every day | ORAL | 3 refills | Status: DC

## 2021-02-28 NOTE — Telephone Encounter (Signed)
*  STAT* If patient is at the pharmacy, call can be transferred to refill team.   1. Which medications need to be refilled? (please list name of each medication and dose if known) clopidogrel (PLAVIX) 75 MG tablet  2. Which pharmacy/location (including street and city if local pharmacy) is medication to be sent to? [place], [place] [address]  3. Do they need a 30 day or 90 day supply? [place]

## 2021-02-28 NOTE — Telephone Encounter (Signed)
Plavix 75 mg was sent to pts pharmacy as directed. VM left for pt.

## 2021-03-05 ENCOUNTER — Other Ambulatory Visit: Payer: Self-pay

## 2021-03-05 MED ORDER — TOPIRAMATE 25 MG PO TABS: 50.0000 mg | ORAL_TABLET | Freq: Every day | ORAL | 3 refills | Status: DC

## 2021-03-07 DIAGNOSIS — Z79899 Other long term (current) drug therapy: Secondary | ICD-10-CM | POA: Diagnosis not present

## 2021-03-07 DIAGNOSIS — E7801 Familial hypercholesterolemia: Secondary | ICD-10-CM | POA: Diagnosis not present

## 2021-03-07 DIAGNOSIS — I10 Essential (primary) hypertension: Secondary | ICD-10-CM | POA: Diagnosis not present

## 2021-03-11 ENCOUNTER — Ambulatory Visit: Payer: BC Managed Care – PPO | Admitting: Cardiovascular Disease

## 2021-04-14 DIAGNOSIS — M79642 Pain in left hand: Secondary | ICD-10-CM | POA: Diagnosis not present

## 2021-04-16 DIAGNOSIS — Z79899 Other long term (current) drug therapy: Secondary | ICD-10-CM | POA: Diagnosis not present

## 2021-04-16 DIAGNOSIS — M2550 Pain in unspecified joint: Secondary | ICD-10-CM | POA: Diagnosis not present

## 2021-04-16 DIAGNOSIS — M0579 Rheumatoid arthritis with rheumatoid factor of multiple sites without organ or systems involvement: Secondary | ICD-10-CM | POA: Diagnosis not present

## 2021-04-16 DIAGNOSIS — R5382 Chronic fatigue, unspecified: Secondary | ICD-10-CM | POA: Diagnosis not present

## 2021-04-21 ENCOUNTER — Telehealth: Payer: Self-pay | Admitting: Cardiovascular Disease

## 2021-04-21 NOTE — Telephone Encounter (Signed)
Pt notified to have them fax a cardiac clearance request

## 2021-04-21 NOTE — Telephone Encounter (Signed)
Patient is scheduled to have surgery on her Thumb Thursday 04/24/21. She wants to know when she should stop taking her medicine before surgery.  Please advise

## 2021-04-24 DIAGNOSIS — M1812 Unilateral primary osteoarthritis of first carpometacarpal joint, left hand: Secondary | ICD-10-CM | POA: Diagnosis not present

## 2021-05-07 DIAGNOSIS — M1812 Unilateral primary osteoarthritis of first carpometacarpal joint, left hand: Secondary | ICD-10-CM | POA: Diagnosis not present

## 2021-05-08 NOTE — Progress Notes (Unsigned)
Office Visit    Patient Name: Sheri Perkins Date of Encounter: 05/09/2021  Primary Care Provider:  Enid Skeens., MD Primary Cardiologist:  Sanda Klein, MD  Chief Complaint    61 year old female with a history of neurally mediated syncope, TIA, hypertension, hyperlipidemia, R renal artery stenosis (presumed secondary to fibromuscular dysplasia), and prior tobacco use who presents for follow-up related to hyper tension and hyperlipidemia.  Past Medical History    Past Medical History:  Diagnosis Date   Acute bronchiolitis    Allergy    Arthritis    fingers , back, neck    Hyperlipidemia    Renal artery stenosis ([place]) 04/03/2011   right 1-59% proximal reduction,left main 1-59% reduction,   Renovascular hypertension    TIA (transient ischemic attack)    Tobacco abuse    Past Surgical History:  Procedure Laterality Date   arm surgery Bilateral    bilateral carpal tunnel surgery     CARPAL TUNNEL RELEASE Bilateral    COLONOSCOPY     FL INJ LEFT KNEE CT ARTHROGRAM ([facility] HX)     HAND SURGERY     INCISION AND DRAINAGE PERIRECTAL ABSCESS N/A 06/11/2020   Procedure: IRRIGATION AND DEBRIDEMENT PERIRECTAL ABSCESS LYTHOTOMY;  Surgeon: Rolm Bookbinder, MD;  Location: [facility];  Service: General;  Laterality: N/A;   KNEE SURGERY     POLYPECTOMY     SHOULDER SURGERY Left    surgery to right thumb     ulnar nerve relocation bilaterally     US ECHOCARDIOGRAPHY  06/16/2010   normal    Allergies  Allergies  Allergen Reactions   Macrobid [Nitrofurantoin Macrocrystal] Other (See Comments)    "burned lungs"   Latex Itching        Sulfa Antibiotics Hives and Rash    History of Present Illness    61 year old female with the above past medical history including neurally mediated syncope, TIA, hypertension, hyperlipidemia, R renal artery stenosis (presumed secondary to fibromuscular dysplasia), and prior tobacco use.   History of renovascular hypertension renal artery  duplex in 2014 suggestive of moderate right artery stenosis secondary to fibromuscular dysplasia.  History prior TIA. Carotid duplex in 2019 showed mild plaque.  Holter monitor in 2019 showed no evidence of atrial fibrillation.  She has a history of aortic ejection murmur, echocardiogram in March 2019 showed a valve aortic valve sclerosis without stenosis, EF 55 to 60%.  She had a syncopal episode in September 2022 while waiting in line at a [employer] store.  ED evaluation including CT showed no intracranial abnormalities.  BP was low at the time and losartan was discontinued.  She was last seen in the office on 01/09/2021 and was stable overall from a cardiac standpoint, she reported no recurrence of syncope.  H/o Syncope: Suspected vasovagal, neurally mediated syncope. prodromal symptoms she should immediately lay flat to avoid syncope, falls, injuries.  Hypertension: BP well controlled. Continue current antihypertensive regimen.   Hyperlipidemia: No recent LDL on file.   Renal artery stenosis: Renal artery duplex 2014 suggestive of moderate right artery stenosis 2/2 fibromuscular dysplasia.  H/o TIA: Carotid duplex 01/2018 with mild plaque.  Holter monitor 01/2018 with no A. fib. Continue aspirin & Plavix, statin therapy  Tobacco use: Full cessation advised.  Disposition:  Home Medications    Current Outpatient Medications  Medication Sig Dispense Refill   ALPRAZolam (XANAX) 0.5 MG tablet Take 0.5 mg by mouth 2 (two) times daily as needed.     aspirin EC  81 MG tablet Take 81 mg by mouth daily.     atorvastatin (LIPITOR) 20 MG tablet Take 1 tablet by mouth once daily 90 tablet 1   CALCIUM CARBONATE-VITAMIN D PO Take 1 tablet by mouth. 2000 iu     clopidogrel (PLAVIX) 75 MG tablet Take 1 tablet (75 mg total) by mouth daily. 90 tablet 3   folic acid (FOLVITE) 1 MG tablet Take 1 mg by mouth daily.     meloxicam (MOBIC) 15 MG tablet Take 15 mg by mouth daily.     topiramate (TOPAMAX) 25 MG  tablet Take 2 tablets (50 mg total) by mouth daily. 180 tablet 3   Ubrogepant (UBRELVY) 100 MG TABS Take 100 mg by mouth daily as needed. 8 tablet 11   No current facility-administered medications for this visit.     Review of Systems    ***.  All other systems reviewed and are otherwise negative except as noted above.    Physical Exam    VS:  There were no vitals taken for this visit. , BMI There is no height or weight on file to calculate BMI.     GEN: Well nourished, well developed, in no acute distress. HEENT: normal. Neck: Supple, no JVD, carotid bruits, or masses. Cardiac: RRR, no murmurs, rubs, or gallops. No clubbing, cyanosis, edema.  Radials/DP/PT 2+ and equal bilaterally.  Respiratory:  Respirations regular and unlabored, clear to auscultation bilaterally. GI: Soft, nontender, nondistended, BS + x 4. MS: no deformity or atrophy. Skin: warm and dry, no rash. Neuro:  Strength and sensation are intact. Psych: Normal affect.  Accessory Clinical Findings    ECG personally reviewed by me today - *** - no acute changes.  Lab Results  Component Value Date   WBC 13.6 (H) 11/17/2020   HGB 14.7 11/17/2020   HCT 42.4 11/17/2020   MCV 97.9 11/17/2020   PLT 256 11/17/2020   Lab Results  Component Value Date   CREATININE 0.85 11/17/2020   BUN 20 11/17/2020   NA 136 11/17/2020   K 3.5 11/17/2020   CL 104 11/17/2020   CO2 21 (L) 11/17/2020   Lab Results  Component Value Date   ALT 13 11/17/2020   AST 14 (L) 11/17/2020   ALKPHOS 93 11/17/2020   BILITOT 0.7 11/17/2020   Lab Results  Component Value Date   CHOL 126 08/25/2018   HDL 47 08/25/2018   LDLCALC 61 08/25/2018   TRIG 90 08/25/2018   CHOLHDL 2.7 08/25/2018    Lab Results  Component Value Date   HGBA1C 5.4 08/25/2018    Assessment & Plan    1.  ***   Lenna Sciara, NP 05/09/2021, 6:11 AM

## 2021-05-09 ENCOUNTER — Ambulatory Visit: Payer: BC Managed Care – PPO | Admitting: Adult Health

## 2021-05-12 DIAGNOSIS — M1812 Unilateral primary osteoarthritis of first carpometacarpal joint, left hand: Secondary | ICD-10-CM | POA: Diagnosis not present

## 2021-05-21 DIAGNOSIS — M1812 Unilateral primary osteoarthritis of first carpometacarpal joint, left hand: Secondary | ICD-10-CM | POA: Diagnosis not present

## 2021-05-28 DIAGNOSIS — M1812 Unilateral primary osteoarthritis of first carpometacarpal joint, left hand: Secondary | ICD-10-CM | POA: Diagnosis not present

## 2021-06-12 DIAGNOSIS — M1812 Unilateral primary osteoarthritis of first carpometacarpal joint, left hand: Secondary | ICD-10-CM | POA: Diagnosis not present

## 2021-06-17 NOTE — Progress Notes (Signed)
? ? ?Office Visit  ?  ?Patient Name: Sheri Perkins ?Date of Encounter: 06/18/2021 ? ?Primary Care Provider:  Enid Skeens., MD ?Primary Cardiologist:  Sanda Klein, MD ? ?Chief Complaint  ?  ?61 year old female with a history of neurally mediated syncope, TIA, hypertension, hyperlipidemia, R renal artery stenosis (presumed secondary to fibromuscular dysplasia), and prior tobacco use who presents for follow-up related to hypertension and syncope. ? ?Past Medical History  ?  ?Past Medical History:  ?Diagnosis Date  ? Acute bronchiolitis   ? Allergy   ? Arthritis   ? fingers , back, neck   ? Hyperlipidemia   ? Renal artery stenosis ([place]) 04/03/2011  ? right 1-59% proximal reduction,left main 1-59% reduction,  ? Renovascular hypertension   ? TIA (transient ischemic attack)   ? Tobacco abuse   ? ?Past Surgical History:  ?Procedure Laterality Date  ? arm surgery Bilateral   ? bilateral carpal tunnel surgery    ? CARPAL TUNNEL RELEASE Bilateral   ? COLONOSCOPY    ? FL INJ LEFT KNEE CT ARTHROGRAM ([facility] HX)    ? HAND SURGERY    ? INCISION AND DRAINAGE PERIRECTAL ABSCESS N/A 06/11/2020  ? Procedure: IRRIGATION AND DEBRIDEMENT PERIRECTAL ABSCESS [place];  Surgeon: Rolm Bookbinder, MD;  Location: [facility];  Service: General;  Laterality: N/A;  ? KNEE SURGERY    ? POLYPECTOMY    ? SHOULDER SURGERY Left   ? surgery to right thumb    ? ulnar nerve relocation bilaterally    ? US ECHOCARDIOGRAPHY  06/16/2010  ? normal  ? ? ?Allergies ? ?Allergies  ?Allergen Reactions  ? Macrobid [Nitrofurantoin Macrocrystal] Other (See Comments)  ?  "burned lungs"  ? Latex Itching  ?   ?  ? Sulfa Antibiotics Hives and Rash  ? ? ?History of Present Illness  ?  ?61 year old female with the above past medical history including neurally mediated syncope, TIA, hypertension, hyperlipidemia, R renal artery stenosis (presumed secondary to fibromuscular dysplasia), and prior tobacco use.  ?  ?History of renovascular hypertension renal artery duplex  in 2014 suggestive of moderate right artery stenosis secondary to fibromuscular dysplasia.  History prior TIA. Carotid duplex in 2019 showed mild plaque.  Holter monitor in 2019 showed no evidence of atrial fibrillation.  She has a history of aortic ejection murmur, echocardiogram in March 2019 showed aortic valve sclerosis without stenosis, EF 55 to 60%.  She had a syncopal episode in September 2022 while waiting in line at a [place] store.  ED evaluation including CT showed no intracranial abnormalities. BP was low at the time and losartan was discontinued. ?  ?She was last seen in the office on 01/09/2021 and was stable overall from a cardiac standpoint, she reported no recurrence of syncope.  She presents today for follow-up.  Since her last visit she has done well from a cardiac standpoint.  She denies any recurrence of syncope.  She has had 3 mechanical falls since her last visit, however, she denies any concerning cardiac symptoms associated with her falls.  She recently had hand surgery, and is out of work while she recovers from this, she is looking forward to retiring in May.  Overall, she reports feeling well denies any concerns or complaints today. ? ?Home Medications  ?  ?Current Outpatient Medications  ?Medication Sig Dispense Refill  ? aspirin EC 81 MG tablet Take 81 mg by mouth daily.    ? atorvastatin (LIPITOR) 20 MG tablet Take 1 tablet by mouth once  daily 90 tablet 1  ? CALCIUM CARBONATE-VITAMIN D PO Take 1 tablet by mouth. 2000 iu    ? clopidogrel (PLAVIX) 75 MG tablet Take 1 tablet (75 mg total) by mouth daily. 90 tablet 3  ? folic acid (FOLVITE) 1 MG tablet Take 1 mg by mouth daily.    ? meloxicam (MOBIC) 15 MG tablet Take 15 mg by mouth daily.    ? topiramate (TOPAMAX) 25 MG tablet Take 2 tablets (50 mg total) by mouth daily. 180 tablet 3  ? ALPRAZolam (XANAX) 0.5 MG tablet Take 0.5 mg by mouth 2 (two) times daily as needed. (Patient not taking: Reported on 06/18/2021)    ? Ubrogepant (UBRELVY)  100 MG TABS Take 100 mg by mouth daily as needed. (Patient not taking: Reported on 06/18/2021) 8 tablet 11  ? ?No current facility-administered medications for this visit.  ?  ? ?Review of Systems  ? ?She denies chest pain, palpitations, dyspnea, pnd, orthopnea, n, v, dizziness, syncope, edema, weight gain, or early satiety. All other systems reviewed and are otherwise negative except as noted above.  ? ?Physical Exam  ?  ?VS:  BP 130/72   Pulse 78   Ht 5' (1.524 m)   Wt 142 lb 12.8 oz (64.8 kg)   SpO2 98%   BMI 27.89 kg/m?   ?GEN: Well nourished, well developed, in no acute distress. ?HEENT: normal. ?Neck: Supple, no JVD, carotid bruits, or masses. ?Cardiac: RRR, no murmurs, rubs, or gallops. No clubbing, cyanosis, edema.  Radials/DP/PT 2+ and equal bilaterally.  ?Respiratory:  Respirations regular and unlabored, clear to auscultation bilaterally. ?GI: Soft, nontender, nondistended, BS + x 4. ?MS: no deformity or atrophy. ?Skin: warm and dry, no rash. ?Neuro:  Strength and sensation are intact. ?Psych: Normal affect. ? ?Accessory Clinical Findings  ?  ?ECG personally reviewed by me today - No EKG in office today. ? ?Lab Results  ?Component Value Date  ? WBC 13.6 (H) 11/17/2020  ? HGB 14.7 11/17/2020  ? HCT 42.4 11/17/2020  ? MCV 97.9 11/17/2020  ? PLT 256 11/17/2020  ? ?Lab Results  ?Component Value Date  ? CREATININE 0.85 11/17/2020  ? BUN 20 11/17/2020  ? NA 136 11/17/2020  ? K 3.5 11/17/2020  ? CL 104 11/17/2020  ? CO2 21 (L) 11/17/2020  ? ?Lab Results  ?Component Value Date  ? ALT 13 11/17/2020  ? AST 14 (L) 11/17/2020  ? ALKPHOS 93 11/17/2020  ? BILITOT 0.7 11/17/2020  ? ?Lab Results  ?Component Value Date  ? CHOL 126 08/25/2018  ? HDL 47 08/25/2018  ? [place] 61 08/25/2018  ? TRIG 90 08/25/2018  ? CHOLHDL 2.7 08/25/2018  ?  ?Lab Results  ?Component Value Date  ? HGBA1C 5.4 08/25/2018  ? ? ?Assessment & Plan  ?  ?1. H/o Syncope: Suspected vasovagal, neurally mediated syncope. If prodromal symptoms she  should immediately lay flat to avoid syncope, falls, injuries. ? ?2. Hypertension: BP well controlled.  She is not on any medications currently due to history of hypotension and syncope. ? ?3. Hyperlipidemia: No recent LDL on file. She states she recently saw her PCP and had labs drawn, she was told that her liver enzymes were elevated, and was advised to hold her statin. She is due for repeat labs and follow-up with PCP.  If liver enzymes have normalized, recommend resumption of statin with recheck lipid profile, LFTs and 6 to 8 weeks. ? ?4. Renal artery stenosis: Renal artery duplex in 2014  suggestive  of moderate right artery stenosis 2/2  fibromuscular dysplasia. ?  ?5. H/o TIA: Carotid duplex 01/2018 with mild plaque. Holter monitor 01/2018 with no A. fib. Continue aspirin & Plavix, statin therapy. ? ?6. H/o tobacco use: She has quit smoking since her last visit. I congratulated her on this. ? ?7. Disposition: F/u in 6 months.  ? ?Lenna Sciara, NP ?06/18/2021, 8:32 AM ?  ?

## 2021-06-18 ENCOUNTER — Other Ambulatory Visit: Payer: Self-pay

## 2021-06-18 ENCOUNTER — Encounter: Payer: Self-pay | Admitting: Nurse Practitioner

## 2021-06-18 ENCOUNTER — Ambulatory Visit (INDEPENDENT_AMBULATORY_CARE_PROVIDER_SITE_OTHER): Payer: BC Managed Care – PPO | Admitting: Nurse Practitioner

## 2021-06-18 VITALS — BP 130/72 | HR 78 | Ht 60.0 in | Wt 142.8 lb

## 2021-06-18 DIAGNOSIS — E785 Hyperlipidemia, unspecified: Secondary | ICD-10-CM | POA: Diagnosis not present

## 2021-06-18 DIAGNOSIS — Z8673 Personal history of transient ischemic attack (TIA), and cerebral infarction without residual deficits: Secondary | ICD-10-CM

## 2021-06-18 DIAGNOSIS — I701 Atherosclerosis of renal artery: Secondary | ICD-10-CM

## 2021-06-18 DIAGNOSIS — Z87898 Personal history of other specified conditions: Secondary | ICD-10-CM | POA: Diagnosis not present

## 2021-06-18 DIAGNOSIS — I10 Essential (primary) hypertension: Secondary | ICD-10-CM | POA: Diagnosis not present

## 2021-06-18 DIAGNOSIS — Z87891 Personal history of nicotine dependence: Secondary | ICD-10-CM

## 2021-06-18 NOTE — Patient Instructions (Signed)
Medication Instructions:  ?Your physician recommends that you continue on your current medications as directed. Please refer to the Current Medication list given to you today.  ? ?*If you need a refill on your cardiac medications before your next appointment, please call your pharmacy* ? ? ?Lab Work: ?NONE ordered at this time of appointment  ? ?Testing/Procedures: ?NONE ordered at this time of appointment  ? ? ?Follow-Up: ?At [facility], you and your health needs are our priority.  As part of our continuing mission to provide you with exceptional heart care, we have created designated Provider Care Teams.  These Care Teams include your primary Cardiologist (physician) and Advanced Practice Providers (APPs -  Physician Assistants and Nurse Practitioners) who all work together to provide you with the care you need, when you need it. ? ?We recommend signing up for the patient portal called "MyChart".  Sign up information is provided on this After Visit Summary.  MyChart is used to connect with patients for Virtual Visits (Telemedicine).  Patients are able to view lab/test results, encounter notes, upcoming appointments, etc.  Non-urgent messages can be sent to your provider as well.   ?To learn more about what you can do with MyChart, go to [email or web address].   ? ?Your next appointment:   ?6 month(s) ? ?The format for your next appointment:   ?In Person ? ?Provider:   ?Sanda Klein, MD   ? ? ?Other Instructions ?Please follow up with your Primary Care Doctor for Cholesterol and Liver Enzymes.  ? ?

## 2021-06-19 DIAGNOSIS — M2550 Pain in unspecified joint: Secondary | ICD-10-CM | POA: Diagnosis not present

## 2021-06-19 DIAGNOSIS — Z79899 Other long term (current) drug therapy: Secondary | ICD-10-CM | POA: Diagnosis not present

## 2021-06-19 DIAGNOSIS — R5382 Chronic fatigue, unspecified: Secondary | ICD-10-CM | POA: Diagnosis not present

## 2021-06-19 DIAGNOSIS — M1812 Unilateral primary osteoarthritis of first carpometacarpal joint, left hand: Secondary | ICD-10-CM | POA: Diagnosis not present

## 2021-06-19 DIAGNOSIS — M0579 Rheumatoid arthritis with rheumatoid factor of multiple sites without organ or systems involvement: Secondary | ICD-10-CM | POA: Diagnosis not present

## 2021-06-30 DIAGNOSIS — Z9889 Other specified postprocedural states: Secondary | ICD-10-CM | POA: Diagnosis not present

## 2021-07-10 DIAGNOSIS — M1812 Unilateral primary osteoarthritis of first carpometacarpal joint, left hand: Secondary | ICD-10-CM | POA: Diagnosis not present

## 2021-07-17 DIAGNOSIS — M1812 Unilateral primary osteoarthritis of first carpometacarpal joint, left hand: Secondary | ICD-10-CM | POA: Diagnosis not present

## 2021-07-18 DIAGNOSIS — M1812 Unilateral primary osteoarthritis of first carpometacarpal joint, left hand: Secondary | ICD-10-CM | POA: Diagnosis not present

## 2021-07-21 DIAGNOSIS — R7989 Other specified abnormal findings of blood chemistry: Secondary | ICD-10-CM | POA: Diagnosis not present

## 2021-07-23 DIAGNOSIS — F4310 Post-traumatic stress disorder, unspecified: Secondary | ICD-10-CM | POA: Diagnosis not present

## 2021-07-25 DIAGNOSIS — F4310 Post-traumatic stress disorder, unspecified: Secondary | ICD-10-CM | POA: Diagnosis not present

## 2021-07-31 DIAGNOSIS — M1812 Unilateral primary osteoarthritis of first carpometacarpal joint, left hand: Secondary | ICD-10-CM | POA: Diagnosis not present

## 2021-08-04 DIAGNOSIS — N3001 Acute cystitis with hematuria: Secondary | ICD-10-CM | POA: Diagnosis not present

## 2021-08-06 DIAGNOSIS — F4310 Post-traumatic stress disorder, unspecified: Secondary | ICD-10-CM | POA: Diagnosis not present

## 2021-08-08 DIAGNOSIS — M1812 Unilateral primary osteoarthritis of first carpometacarpal joint, left hand: Secondary | ICD-10-CM | POA: Diagnosis not present

## 2021-08-15 DIAGNOSIS — Z9889 Other specified postprocedural states: Secondary | ICD-10-CM | POA: Diagnosis not present

## 2021-08-15 DIAGNOSIS — M1812 Unilateral primary osteoarthritis of first carpometacarpal joint, left hand: Secondary | ICD-10-CM | POA: Diagnosis not present

## 2021-11-25 ENCOUNTER — Encounter: Payer: Self-pay | Admitting: Cardiovascular Disease

## 2021-11-25 NOTE — Telephone Encounter (Signed)
Error

## 2021-11-25 NOTE — Progress Notes (Unsigned)
Patient ID: Sheri Perkins, female   DOB: 1961-01-22, 61 y.o.   MRN: [id number]    Cardiology Office Note    Date:  11/26/2021   ID:  Sheri Perkins, DOB 1960-04-23, MRN [id number]  PCP:  Enid Skeens., MD  Cardiologist:   Sanda Klein, MD   Chief Complaint  Patient presents with   Loss of Consciousness    History of Present Illness:  ANGELLA Perkins is a 61 y.o. female with a history of a 60-99% right renal artery stenosis, presumed secondary to fibromuscular dysplasia, neurally mediated syncope, previous smoking and mild hyperlipidemia.  In the year that has passed since her last episode of severe syncope on November 09, 2020, when she had a minor head injury, she has had 2 more episodes of syncope.  One of them occurred during the hot summer day by the pool when she was sitting down, that occurred at home standing up.  On both occasions she had a typical prodrome warning signs dizziness and flushing sensation.  Both events were brief and she recovered fully after lying down.  She has had syncopal events in the past when seeing the needle or blood.    She has had to take early retirement due to joint problems.  She is taking meloxicam daily and recently took prednisone for finger stiffness.  The patient specifically denies any chest pain at rest or with exertion, dyspnea at rest or with exertion, orthopnea, paroxysmal nocturnal dyspnea,  palpitations, focal neurological deficits, intermittent claudication, lower extremity edema, unexplained weight gain, cough, hemoptysis or wheezing.   She is under emotional stress due to her son's illness (he had to have testicular surgery and during recovery was discovered to have a small intracranial hemorrhage causing seizures).  In the past she had an episode suggestive of right hemispheric TIA (numbness and clumsiness in the left upper extremity and left side of her face), no recurrence in over 3 years. Carotid ultrasonography did not show  any evidence of obstruction.  She is taking clopidogrel and aspirin.  MRI brain June 2020 was normal.  She has an aortic ejection murmur.  Echo on March 2019 showed aortic valve sclerosis without stenosis.  She has not had a renal vascular duplex ultrasound since 2014.  Renal function is normal with a creatinine of 0.88 on 07/21/2021.   Past Medical History:  Diagnosis Date   Acute bronchiolitis    Allergy    Arthritis    fingers , back, neck    Hyperlipidemia    Renal artery stenosis ([place]) 04/03/2011   right 1-59% proximal reduction,left main 1-59% reduction,   Renovascular hypertension    TIA (transient ischemic attack)    Tobacco abuse     Past Surgical History:  Procedure Laterality Date   arm surgery Bilateral    bilateral carpal tunnel surgery     CARPAL TUNNEL RELEASE Bilateral    COLONOSCOPY     FL INJ LEFT KNEE CT ARTHROGRAM ([facility] HX)     HAND SURGERY     INCISION AND DRAINAGE PERIRECTAL ABSCESS N/A 06/11/2020   Procedure: IRRIGATION AND DEBRIDEMENT PERIRECTAL ABSCESS [place];  Surgeon: Rolm Bookbinder, MD;  Location: [facility];  Service: General;  Laterality: N/A;   KNEE SURGERY     POLYPECTOMY     SHOULDER SURGERY Left    surgery to right thumb     ulnar nerve relocation bilaterally     US ECHOCARDIOGRAPHY  06/16/2010   normal    Outpatient Medications  Prior to Visit  Medication Sig Dispense Refill   aspirin EC 81 MG tablet Take 81 mg by mouth daily.     atorvastatin (LIPITOR) 20 MG tablet Take 1 tablet by mouth once daily 90 tablet 1   CALCIUM CARBONATE-VITAMIN D PO Take 1 tablet by mouth. 2000 iu     clopidogrel (PLAVIX) 75 MG tablet Take 1 tablet (75 mg total) by mouth daily. 90 tablet 3   folic acid (FOLVITE) 1 MG tablet Take 1 mg by mouth daily.     meloxicam (MOBIC) 15 MG tablet Take 15 mg by mouth daily.     methotrexate 2.5 MG tablet Take 2.5 mg by mouth once a week.     topiramate (TOPAMAX) 25 MG tablet Take 2 tablets (50 mg total) by mouth daily. 180  tablet 3   ALPRAZolam (XANAX) 0.5 MG tablet Take 0.5 mg by mouth 2 (two) times daily as needed. (Patient not taking: Reported on 11/26/2021)     Ubrogepant (UBRELVY) 100 MG TABS Take 100 mg by mouth daily as needed. (Patient not taking: Reported on 11/26/2021) 8 tablet 11   No facility-administered medications prior to visit.     Allergies:   Macrobid [nitrofurantoin macrocrystal], Latex, and Sulfa antibiotics   Social History   Socioeconomic History   Marital status: Married    Spouse name: Not on file   Number of children: Not on file   Years of education: Not on file   Highest education level: Not on file  Occupational History   Not on file  Tobacco Use   Smoking status: Some Days    Packs/day: 0.12    Types: Cigarettes    Last attempt to quit: 04/03/2017    Years since quitting: 4.6   Smokeless tobacco: Never   Tobacco comments:    11/26/2021 Patient smokes cigarettes occassionally with a pack lasting about two weeks.  Vaping Use   Vaping Use: Never used  Substance and Sexual Activity   Alcohol use: No    Alcohol/week: 0.0 standard drinks of alcohol   Drug use: No   Sexual activity: Not on file  Other Topics Concern   Not on file  Social History Narrative   Not on file   Social Determinants of Health   Financial Resource Strain: Not on file  Food Insecurity: Not on file  Transportation Needs: Not on file  Physical Activity: Not on file  Stress: Not on file  Social Connections: Not on file     Family History:  The patient's mother has coronary artery disease status post stent, obstructive sleep apnea, hyperlipidemia  ROS:   Please see the history of present illness.    ROS  All other systems are reviewed and are negative.   PHYSICAL EXAM:   VS:  BP (!) 160/80 (BP Location: Left Arm, Patient Position: Sitting, Cuff Size: Normal)   Pulse 76   Ht 5' (1.524 m)   Wt 142 lb 3.2 oz (64.5 kg)   BMI 27.77 kg/m       General: Alert, oriented x3, no distress,  appears slightly sedated.  Mildly overweight Head: no evidence of trauma, PERRL, EOMI, no exophtalmos or lid lag, no myxedema, no xanthelasma; normal ears, nose and oropharynx Neck: normal jugular venous pulsations and no hepatojugular reflux; brisk carotid pulses without delay and no carotid bruits Chest: clear to auscultation, no signs of consolidation by percussion or palpation, normal fremitus, symmetrical and full respiratory excursions Cardiovascular: normal position and quality of the apical  impulse, regular rhythm, normal first and second heart sounds, faint systolic ejection murmur, early peaking in the right upper sternal border area; no diastolic murmurs, rubs or gallops Abdomen: no tenderness or distention, no masses by palpation, no abnormal pulsatility or arterial bruits, normal bowel sounds, no hepatosplenomegaly Extremities: no clubbing, cyanosis or edema; 2+ radial, ulnar and brachial pulses bilaterally; 2+ right femoral, posterior tibial and dorsalis pedis pulses; 2+ left femoral, posterior tibial and dorsalis pedis pulses; no subclavian or femoral bruits Neurological: grossly nonfocal Psych: Normal mood and affect     Wt Readings from Last 3 Encounters:  11/26/21 142 lb 3.2 oz (64.5 kg)  06/18/21 142 lb 12.8 oz (64.8 kg)  01/22/21 144 lb 9.6 oz (65.6 kg)      Studies/Labs Reviewed:   EKG: Not performed today.  ECG from 01/09/2021 is personally reviewed and shows normal sinus rhythm, possible left atrial normality, no evidence of left ventricular hypertrophy by voltage criteria or repolarization abnormalities.  Recent Labs:  Lipid Panel     Component Value Date/Time   CHOL 126 08/25/2018 1345   TRIG 90 08/25/2018 1345   HDL 47 08/25/2018 1345   CHOLHDL 2.7 08/25/2018 1345   CHOLHDL 4.1 05/10/2015 0910   VLDL 15 05/10/2015 0910   LDLCALC 61 08/25/2018 1345   BMET    Component Value Date/Time   NA 136 11/17/2020 1512   NA 144 01/28/2018 0946   K 3.5  11/17/2020 1512   CL 104 11/17/2020 1512   CO2 21 (L) 11/17/2020 1512   GLUCOSE 97 11/17/2020 1512   BUN 20 11/17/2020 1512   BUN 11 01/28/2018 0946   CREATININE 0.85 11/17/2020 1512   CREATININE 0.64 05/10/2015 0910   CALCIUM 8.7 (L) 11/17/2020 1512   GFRNONAA >60 11/17/2020 1512   GFRAA 112 01/28/2018 0946   06/19/2021 Cholesterol 191, HDL 44, LDL 132, triglycerides 80, hemoglobin 13.7  07/21/2021 Creatinine 0.88, ALT 21  ASSESSMENT:    1. Neurocardiogenic syncope   2. History of TIA (transient ischemic attack)   3. Renal artery stenosis ([place])   4. Hyperlipidemia LDL goal <70       PLAN:  In order of problems listed above:  Neurally mediated syncope: Condition is lifelong, sometimes with typical triggers, sometimes unclear why it occurs.  Reminded her to stay well-hydrated.  Avoid standing still without moving for long periods of time.  Avoid known triggers.  Unfortunately cannot recommend a salt rich diet due to her blood pressure. HBP: She had to stop taking blood pressure medication was last year due to low blood pressure.  Reports that when she is at home her blood pressure is low in the 120s, not as high as today.  We will ask her to keep a log of her blood pressure at home and make no changes today.  Elevation of blood pressure may be related to the use of NSAIDs and prednisone. TIA: No recent occurrences.  Her syncopal event does not not sound at all like an ischemic insult.  Continue aspirin and Plavix. Renal artery stenosis/renovascular hypertension: Normal renal function.  Recheck renal duplex ultrasound. HLP: On statin, target LDL less than 70.  Labs performed in March were apparently done when she was off the atorvastatin.     Medication Adjustments/Labs and Tests Ordered: Current medicines are reviewed at length with the patient today.  Concerns regarding medicines are outlined above.  Medication changes, Labs and Tests ordered today are listed in the Patient  Instructions below. Patient Instructions  Medication Instructions:  No changes *If you need a refill on your cardiac medications before your next appointment, please call your pharmacy*   Lab Work: None ordered If you have labs (blood work) drawn today and your tests are completely normal, you will receive your results only by: [place] (if you have MyChart) OR A paper copy in the mail If you have any lab test that is abnormal or we need to change your treatment, we will call you to review the results.   Testing/Procedures: Your physician has requested that you have a renal artery duplex. During this test, an ultrasound is used to evaluate blood flow to the kidneys. Take your medications as you usually do. This will take place at [place], [address].  No food after 11PM the night before.  Water is OK. (Don't drink liquids if you have been instructed not to for ANOTHER test). Avoid foods that produce bowel gas, for 24 hours prior to exam (see below). No breakfast, no chewing gum, no smoking or carbonated beverages. Patient may take morning medications with water. Come in for test at least 15 minutes early to register.    Follow-Up: At [facility], you and your health needs are our priority.  As part of our continuing mission to provide you with exceptional heart care, we have created designated Provider Care Teams.  These Care Teams include your primary Cardiologist (physician) and Advanced Practice Providers (APPs -  Physician Assistants and Nurse Practitioners) who all work together to provide you with the care you need, when you need it.  We recommend signing up for the patient portal called "MyChart".  Sign up information is provided on this After Visit Summary.  MyChart is used to connect with patients for Virtual Visits (Telemedicine).  Patients are able to view lab/test results, encounter notes, upcoming appointments, etc.  Non-urgent messages can be sent  to your provider as well.   To learn more about what you can do with MyChart, go to [email or web address].    Your next appointment:   12 month(s)  The format for your next appointment:   In Person  Provider:   Sanda Klein, MD     Other Instructions Dr. Sallyanne Kuster would like you to check your blood pressure daily for the next week.  Keep a journal of these daily blood pressure and heart rate readings and call our office or send a message through [place] with the results. Thank you!  It is best to check your BP 1-2 hours after taking your medications to see the medications effectiveness on your BP.    Here are some tips that our clinical pharmacists share for home BP monitoring:          Rest 10 minutes before taking your blood pressure.          Don't smoke or drink caffeinated beverages for at least 30 minutes before.          Take your blood pressure before (not after) you eat.          Sit comfortably with your back supported and both feet on the floor (don't cross your legs).          Elevate your arm to heart level on a table or a desk.          Use the proper sized cuff. It should fit smoothly and snugly around your bare upper arm. There should be enough room to slip a fingertip under the cuff.  The bottom edge of the cuff should be 1 inch above the crease of the elbow.   Important Information About Sugar            Signed, Sanda Klein, MD  11/26/2021 8:40 AM    [facility] [address], [place], [place]  [address] Phone: [phone]; Fax: [phone]

## 2021-11-26 ENCOUNTER — Ambulatory Visit: Payer: BC Managed Care – PPO | Attending: Cardiovascular Disease | Admitting: Cardiovascular Disease

## 2021-11-26 ENCOUNTER — Encounter: Payer: Self-pay | Admitting: Cardiovascular Disease

## 2021-11-26 VITALS — BP 160/80 | HR 76 | Ht 60.0 in | Wt 142.2 lb

## 2021-11-26 DIAGNOSIS — Z8673 Personal history of transient ischemic attack (TIA), and cerebral infarction without residual deficits: Secondary | ICD-10-CM | POA: Diagnosis not present

## 2021-11-26 DIAGNOSIS — I701 Atherosclerosis of renal artery: Secondary | ICD-10-CM

## 2021-11-26 DIAGNOSIS — R55 Syncope and collapse: Secondary | ICD-10-CM | POA: Diagnosis not present

## 2021-11-26 DIAGNOSIS — E785 Hyperlipidemia, unspecified: Secondary | ICD-10-CM | POA: Diagnosis not present

## 2021-11-26 NOTE — Patient Instructions (Signed)
Medication Instructions:  No changes *If you need a refill on your cardiac medications before your next appointment, please call your pharmacy*   Lab Work: None ordered If you have labs (blood work) drawn today and your tests are completely normal, you will receive your results only by: [place] (if you have MyChart) OR A paper copy in the mail If you have any lab test that is abnormal or we need to change your treatment, we will call you to review the results.   Testing/Procedures: Your physician has requested that you have a renal artery duplex. During this test, an ultrasound is used to evaluate blood flow to the kidneys. Take your medications as you usually do. This will take place at [place], [address].  No food after 11PM the night before.  Water is OK. (Don't drink liquids if you have been instructed not to for ANOTHER test). Avoid foods that produce bowel gas, for 24 hours prior to exam (see below). No breakfast, no chewing gum, no smoking or carbonated beverages. Patient may take morning medications with water. Come in for test at least 15 minutes early to register.    Follow-Up: At [facility], you and your health needs are our priority.  As part of our continuing mission to provide you with exceptional heart care, we have created designated Provider Care Teams.  These Care Teams include your primary Cardiologist (physician) and Advanced Practice Providers (APPs -  Physician Assistants and Nurse Practitioners) who all work together to provide you with the care you need, when you need it.  We recommend signing up for the patient portal called "MyChart".  Sign up information is provided on this After Visit Summary.  MyChart is used to connect with patients for Virtual Visits (Telemedicine).  Patients are able to view lab/test results, encounter notes, upcoming appointments, etc.  Non-urgent messages can be sent to your provider as well.   To learn more  about what you can do with MyChart, go to [email or web address].    Your next appointment:   12 month(s)  The format for your next appointment:   In Person  Provider:   Sanda Klein, MD     Other Instructions Dr. Sallyanne Kuster would like you to check your blood pressure daily for the next week.  Keep a journal of these daily blood pressure and heart rate readings and call our office or send a message through [place] with the results. Thank you!  It is best to check your BP 1-2 hours after taking your medications to see the medications effectiveness on your BP.    Here are some tips that our clinical pharmacists share for home BP monitoring:          Rest 10 minutes before taking your blood pressure.          Don't smoke or drink caffeinated beverages for at least 30 minutes before.          Take your blood pressure before (not after) you eat.          Sit comfortably with your back supported and both feet on the floor (don't cross your legs).          Elevate your arm to heart level on a table or a desk.          Use the proper sized cuff. It should fit smoothly and snugly around your bare upper arm. There should be enough room to slip a fingertip under the cuff.  The bottom edge of the cuff should be 1 inch above the crease of the elbow.   Important Information About Sugar

## 2021-12-10 DIAGNOSIS — R5382 Chronic fatigue, unspecified: Secondary | ICD-10-CM | POA: Diagnosis not present

## 2021-12-10 DIAGNOSIS — M0579 Rheumatoid arthritis with rheumatoid factor of multiple sites without organ or systems involvement: Secondary | ICD-10-CM | POA: Diagnosis not present

## 2021-12-10 DIAGNOSIS — Z79899 Other long term (current) drug therapy: Secondary | ICD-10-CM | POA: Diagnosis not present

## 2021-12-10 DIAGNOSIS — M2550 Pain in unspecified joint: Secondary | ICD-10-CM | POA: Diagnosis not present

## 2021-12-19 ENCOUNTER — Ambulatory Visit (HOSPITAL_COMMUNITY): Admission: RE | Admit: 2021-12-19 | Payer: BC Managed Care – PPO | Source: Ambulatory Visit

## 2022-01-15 ENCOUNTER — Ambulatory Visit (HOSPITAL_COMMUNITY): Admission: RE | Admit: 2022-01-15 | Payer: BC Managed Care – PPO | Source: Ambulatory Visit

## 2022-01-15 NOTE — Progress Notes (Deleted)
No chief complaint on file.   HISTORY OF PRESENT ILLNESS:  01/15/22 ALL:  Sheri Perkins returns for follow up for headaches. She continues topiramate and Ubrelvy.   01/22/2021 ALL: Sheri Perkins returns for follow up for worsening headaches. She was restarted on topiramate '50mg'$  daily at last follow up 06/2020 (been off for several months prior d/t hospitalization). After restarting topiramate, headaches improved. She has about 8 headache days a month with 2 or so being migrainous. She was using Tylenol or ibuprofen   She reports a syncope with fall in 11/16/2020. CT was normal. Vasovagal event suspected. She reported lower BP readings and labs concerning for hemoconcentration. IV fluids made her feel better. She has renovascular hypertension. She reports BP had been running low. Losartan was discontinued. She is now drinking at least 40 ounces of water daily. She continues to have periods of time when she feels that she may pass out.No actual syncope. She can be standing up for a period of time and feel it coming on or she may be sitting and it occur. She has a long standing history of neurocardiogenic syncope. No obvious triggers. She is scheduled with PCP for follow up next month.   07/18/2020 ALL: She returns for follow up for headaches. She reports taking topiramate until around 05/2020 when hospitalized for cellulitis of perineum. She did not continue following discharge. She feels it was helping when taking regularly. She has stopped taking Tylenol and ibuprofen as she was recently started on prednisone and meloxicam for arthritis. She continues close follow up with PCP. BP is well managed. She continues Plavix and asa.   04/03/2020 ALL:  Sheri Perkins is a 61 y.o. female here today for follow up for headaches. She was last seen by Dr Leonie Man in 10/2018 when topiramate was increased to '50mg'$  BID. She reports that headaches are better. She may 8-10 headaches a month. She rarely has severe symptoms.  Occasionally has nausea but feels this is triggered by smell of solvents used at work. She is having to wear a mask at work and feels this contributed. She alternates Tylenol and ibuprofen. She usually takes either 4 tablets of Tylenol or 4 tablets of ibuprofen everyday before work (5 days a week).   She reports that she has had more dizziness over the past two months. She has had 3 falls in the period of time. She had a event last week when she woke up around 2:30 to get ready for work. She raised out of the bed and stood up then felt dizzy. She had to grab a piece of furniture to keep from falling. Another episode was when she was sitting in the floor. She stood up and walked into another room and felt dizzy. Dizziness resolves after a few seconds. She reports that she can experience the dizziness when walking as well. She fell while coming down from a latter to the attack and missed the last step. The other two falls were when she was in a squatting and fell backwards. She reports recent issues with her blood pressures. Losartan was increased last year. BP had been "running high". She was at work on 12/30 and states that she "didn't feel right". She felt lightheaded. The nurse checked her blood pressure at work and reading was 85/57. She tried to stay at work but continued to feel unbalanced. She had to go home. She called her cardiologist and he reduced Losartan to '50mg'$ . She has felt better since. She was seen for  follow up with cardiology yesterday and reports that reading were good.   She does have a history of TIA.  She continues regular follow up with PCP for stroke prevention and management of comorbidities. She continues aspirin '81mg'$  and atorvastatin '20mg'$  daily. She had stopped Plavix but recently advised to resume by cardiology. She reports last cigarette was 03/19/2020. She seems to be doing well with smoking cessation.   HISTORY (copied from previous note)  11/03/2018 PS: HPI: Initial visit  08/25/2018:Sheri Perkins is a 61 year old pleasant Caucasian lady seen today for initial office consultation visit for TIA and chronic headaches.  History is obtained from the patient and review of referral notes.  She states that sometime in January or February she had a brief episode of sudden onset of left face arm and leg tingling and numbness.  This lasted about 30 minutes.  She had a mild headache which was not disabling.  She did not have any significant weakness and was able to walk fine at that time.  She did not have any slurred speech.  She checked her blood pressure at that time it was fine.  She did not seek medical help right away.  She mentioned this several weeks later with a primary care visit.  She did see a cardiologist in March who ordered carotid ultrasound which I have personally reviewed showed no significant extracranial stenosis as well as 30-day heart monitor which was negative for significant arrhythmias.  Patient has not had any recurrent TIA or stroke symptoms.  She denies slurred speech, extremity weakness, gait or balance problems.  Review of her electronic medical records show that she had an MRI scan of the brain and MRA of the brain which was normal in 2006 at that time she had episode of dizziness lightheadedness and passing out.  The patient also complains of her chronic daily headaches which she is had for 2 years.  She describes a headache present mostly bifrontally dull in 6/10 in severity occasionally throbbing.  The headaches are noticed during the day after going to work every day.  She has been taking now 4 tablets of Tylenol every day prior to going to work in the past she was taking 4 tablets of ibuprofen.  She has not tried any medication for headache prophylaxis.  She does complain of tightness of the muscles of her neck.  She does not do any regular neck stretching exercises.  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  She has no prior history of strokes or TIAs.  She does smoke 1 pack/day.  Update 11/03/2018 : She returns for follow-up after last visit 2 months ago.  She states her headaches are improved but they are still occurring 3 times a week or so.  They are much milder.  She takes Tylenol which helps.  She is tolerating Topamax 25 mg twice daily quite well without any side effects.  She is on aspirin 81 mg daily which is tolerating well.  Her blood pressure usually is better controlled today it is elevated in the office at 189/81.  She has no new complaints.  She did undergo MRI scan of the brain on 09/15/2018 which was unremarkable.  She admits to being under significant stress since she is bringing up 3 of her nieces children   REVIEW OF SYSTEMS: Out of  a complete 14 system review of symptoms, the patient complains only of the following symptoms, headaches, syncope, arthritis and all other reviewed systems are negative.   ALLERGIES: Allergies  Allergen Reactions   Macrobid [Nitrofurantoin Macrocrystal] Other (See Comments)    "burned lungs"   Latex Itching        Sulfa Antibiotics Hives and Rash     HOME MEDICATIONS: Outpatient Medications Prior to Visit  Medication Sig Dispense Refill   ALPRAZolam (XANAX) 0.5 MG tablet Take 0.5 mg by mouth 2 (two) times daily as needed. (Patient not taking: Reported on 11/26/2021)     aspirin EC 81 MG tablet Take 81 mg by mouth daily.     atorvastatin (LIPITOR) 20 MG tablet Take 1 tablet by mouth once daily 90 tablet 1   CALCIUM CARBONATE-VITAMIN D PO Take 1 tablet by mouth. 2000 iu     clopidogrel (PLAVIX) 75 MG tablet Take 1 tablet (75 mg total) by mouth daily. 90 tablet 3   folic acid (FOLVITE) 1 MG tablet Take 1 mg by mouth daily.     meloxicam (MOBIC) 15 MG tablet Take 15 mg by mouth daily.     methotrexate 2.5 MG tablet Take 2.5 mg by mouth once a week.     topiramate  (TOPAMAX) 25 MG tablet Take 2 tablets (50 mg total) by mouth daily. 180 tablet 3   No facility-administered medications prior to visit.     PAST MEDICAL HISTORY: Past Medical History:  Diagnosis Date   Acute bronchiolitis    Allergy    Arthritis    fingers , back, neck    Hyperlipidemia    Renal artery stenosis ([place]) 04/03/2011   right 1-59% proximal reduction,left main 1-59% reduction,   Renovascular hypertension    TIA (transient ischemic attack)    Tobacco abuse      PAST SURGICAL HISTORY: Past Surgical History:  Procedure Laterality Date   arm surgery Bilateral    bilateral carpal tunnel surgery     CARPAL TUNNEL RELEASE Bilateral    COLONOSCOPY     FL INJ LEFT KNEE CT ARTHROGRAM ([facility] HX)     HAND SURGERY     INCISION AND DRAINAGE PERIRECTAL ABSCESS N/A 06/11/2020   Procedure: IRRIGATION AND DEBRIDEMENT PERIRECTAL ABSCESS LYTHOTOMY;  Surgeon: Rolm Bookbinder, MD;  Location: [facility];  Service: General;  Laterality: N/A;   KNEE SURGERY     POLYPECTOMY     SHOULDER SURGERY Left    surgery to right thumb     ulnar nerve relocation bilaterally     US ECHOCARDIOGRAPHY  06/16/2010   normal     FAMILY HISTORY: Family History  Problem Relation Age of Onset   Melanoma Sister    Seizures Sister    Colon cancer Neg Hx    Colon polyps Neg Hx    Rectal cancer Neg Hx    Stomach cancer Neg Hx      SOCIAL HISTORY: Social History   Socioeconomic History   Marital status: Married    Spouse name: Not on file   Number of children: Not on file   Years of education: Not on file   Highest education level: Not on file  Occupational History   Not on file  Tobacco Use   Smoking status: Some Days    Packs/day: 0.12    Types: Cigarettes    Last attempt to quit: 04/03/2017    Years since quitting: 4.7   Smokeless tobacco: Never   Tobacco comments:  11/26/2021 Patient smokes cigarettes occassionally with a pack lasting about two weeks.  Vaping Use   Vaping Use: Never  used  Substance and Sexual Activity   Alcohol use: No    Alcohol/week: 0.0 standard drinks of alcohol   Drug use: No   Sexual activity: Not on file  Other Topics Concern   Not on file  Social History Narrative   Not on file   Social Determinants of Health   Financial Resource Strain: Not on file  Food Insecurity: Not on file  Transportation Needs: Not on file  Physical Activity: Not on file  Stress: Not on file  Social Connections: Not on file  Intimate Partner Violence: Not on file      PHYSICAL EXAM  There were no vitals filed for this visit.   There is no height or weight on file to calculate BMI.  No data found.   Generalized: Well developed, in no acute distress  Cardiology: normal rate and rhythm, no murmur auscultated  Respiratory: clear to auscultation bilaterally    Neurological examination  Mentation: Alert oriented to time, place, history taking. Follows all commands speech and language fluent Cranial nerve II-XII: Pupils were equal round reactive to light. Extraocular movements were full, visual field were full on confrontational test. Facial sensation and strength were normal. Head turning and shoulder shrug  were normal and symmetric. No nystagmus. Motor: The motor testing reveals 5 over 5 strength of all 4 extremities. Good symmetric motor tone is noted throughout.   Gait and station: Gait is normal.    DIAGNOSTIC DATA (LABS, IMAGING, TESTING) - I reviewed patient records, labs, notes, testing and imaging myself where available.  Lab Results  Component Value Date   WBC 13.6 (H) 11/17/2020   HGB 14.7 11/17/2020   HCT 42.4 11/17/2020   MCV 97.9 11/17/2020   PLT 256 11/17/2020      Component Value Date/Time   NA 136 11/17/2020 1512   NA 144 01/28/2018 0946   K 3.5 11/17/2020 1512   CL 104 11/17/2020 1512   CO2 21 (L) 11/17/2020 1512   GLUCOSE 97 11/17/2020 1512   BUN 20 11/17/2020 1512   BUN 11 01/28/2018 0946   CREATININE 0.85  11/17/2020 1512   CREATININE 0.64 05/10/2015 0910   CALCIUM 8.7 (L) 11/17/2020 1512   PROT 6.9 11/17/2020 1512   PROT 6.8 01/28/2018 0946   ALBUMIN 3.9 11/17/2020 1512   ALBUMIN 4.1 01/28/2018 0946   AST 14 (L) 11/17/2020 1512   ALT 13 11/17/2020 1512   ALKPHOS 93 11/17/2020 1512   BILITOT 0.7 11/17/2020 1512   BILITOT 0.4 01/28/2018 0946   GFRNONAA >60 11/17/2020 1512   GFRAA 112 01/28/2018 0946   Lab Results  Component Value Date   CHOL 126 08/25/2018   HDL 47 08/25/2018   LDLCALC 61 08/25/2018   TRIG 90 08/25/2018   CHOLHDL 2.7 08/25/2018   Lab Results  Component Value Date   HGBA1C 5.4 08/25/2018   No results found for: "VITAMINB12" No results found for: "TSH"      No data to display           ASSESSMENT AND PLAN  61 y.o. year old female  has a past medical history of Acute bronchiolitis, Allergy, Arthritis, Hyperlipidemia, Renal artery stenosis ([place]) (04/03/2011), Renovascular hypertension, TIA (transient ischemic attack), and Tobacco abuse. here with   No diagnosis found.  Stana is doing very well from a headache perspective. She will continue topiramate '50mg'$  daily. I  will start her on Ubrelvy for abortive therapy. Appropriate administration and side effects reviewed. She will continue close follow up with cardiology for syncopal episodes. She was encouraged to continue working on staying well hydrated and monitoring BP.  I have encouraged her to follow-up closely with primary care for stroke prevention and updated labs.  She will continue Plavix and aspirin as well as atorvastatin for stroke prevention. I will have her follow-up with me in 1 year, sooner if needed. She verbalizes understanding and agreement with this plan.   No orders of the defined types were placed in this encounter.     Debbora Presto, MSN, FNP-C 01/15/2022, 11:31 AM  [facility] [address] [place], [place] [address] [phone]

## 2022-01-15 NOTE — Patient Instructions (Incomplete)

## 2022-01-22 ENCOUNTER — Ambulatory Visit: Payer: BC Managed Care – PPO | Admitting: Family Medicine

## 2022-01-22 DIAGNOSIS — G44209 Tension-type headache, unspecified, not intractable: Secondary | ICD-10-CM

## 2022-01-27 IMAGING — CT CT PELVIS W/ CM
2 of 3 series · 15 of 46 positions shown, 17 images · IV contrast (omnipaque)
Comparison: None.

CLINICAL DATA: Focal cutaneous lesion/infection at the groin area.

EXAM:
CT PELVIS WITH CONTRAST
TECHNIQUE: Multidetector CT imaging of the pelvis was performed using the
standard protocol following the bolus administration of intravenous
contrast.
CONTRAST:  100mL OMNIPAQUE IOHEXOL 300 MG/ML  SOLN

[Series 2: axial st · axial · 0.79mm/px · z∈[+958,+1242]mm · 12 of 67 slices shown, 14 images]
[im 5/67  soft-tissue]
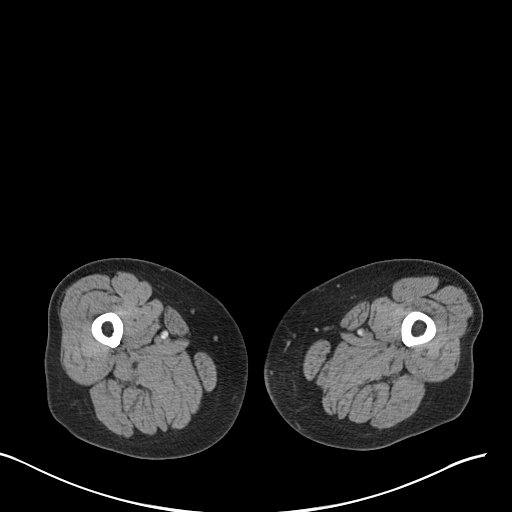
[im 5/67  bone]
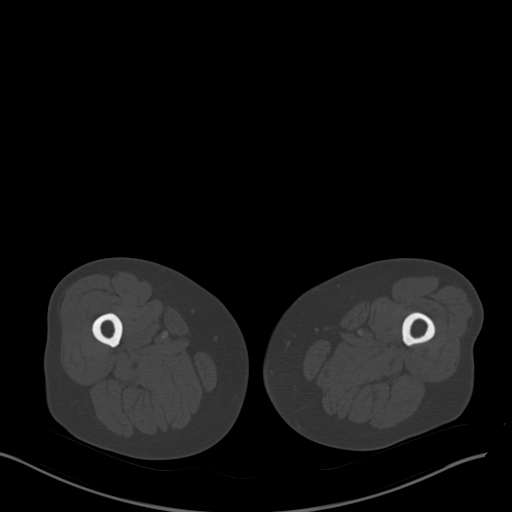
[im 9/67  soft-tissue]
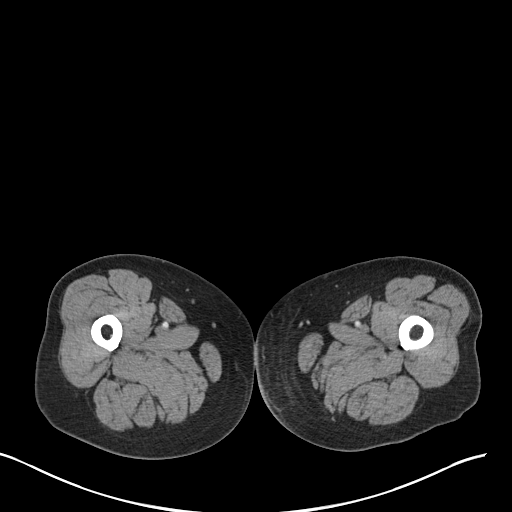
[im 15/67  soft-tissue]
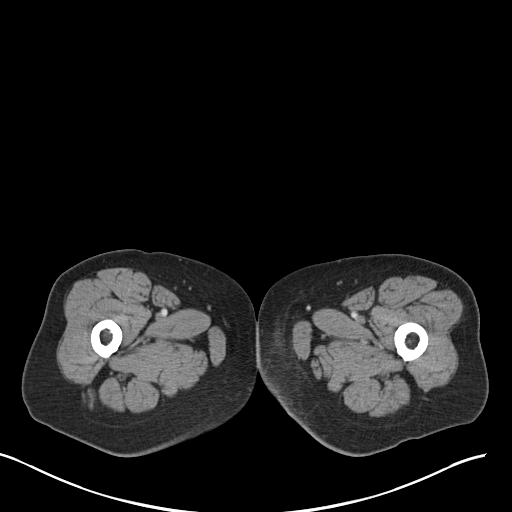
[im 20/67  soft-tissue]
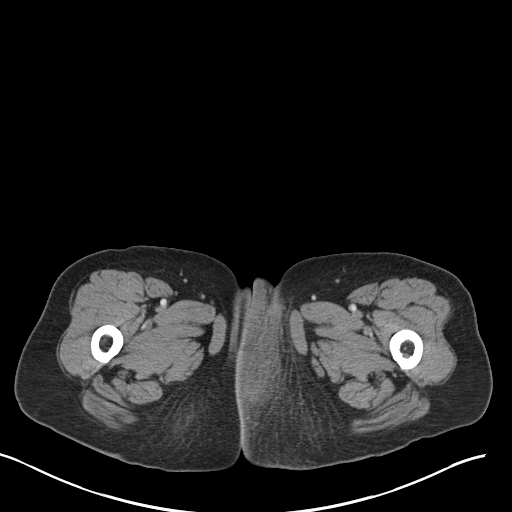
[im 26/67  soft-tissue]
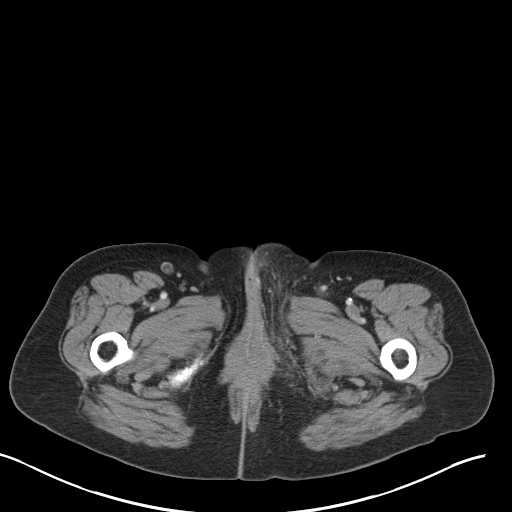
[im 30/67  soft-tissue]
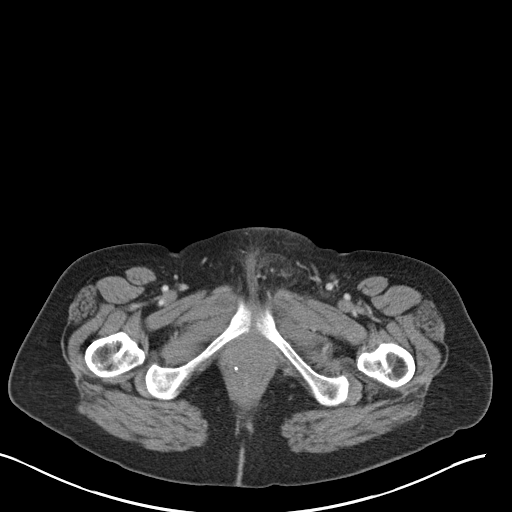
[im 37/67  soft-tissue]
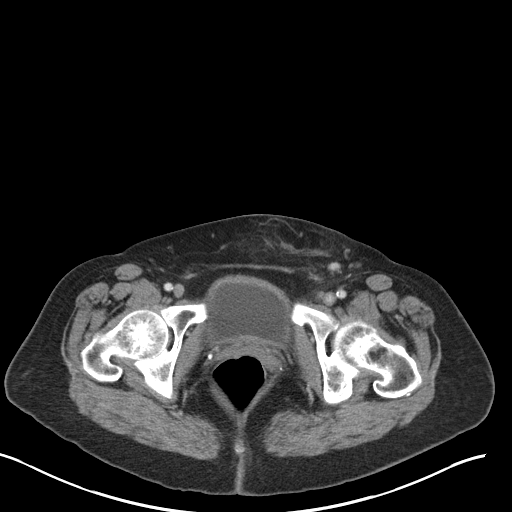
[im 41/67  soft-tissue]
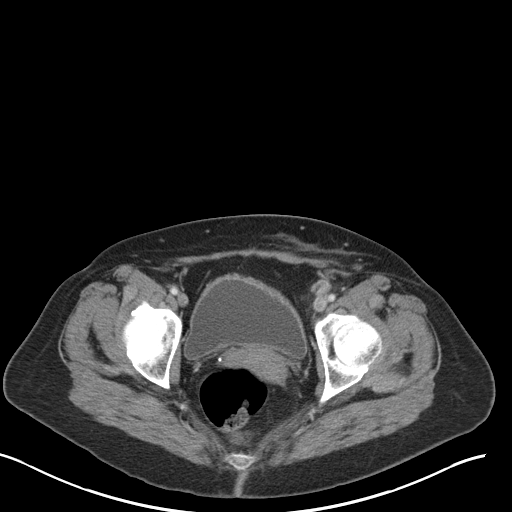
[im 47/67  soft-tissue]
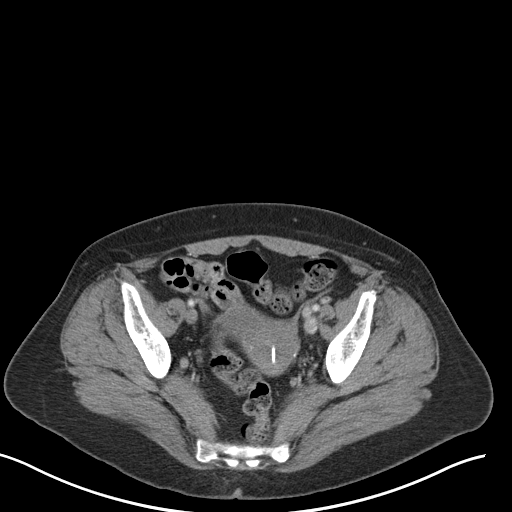
[im 47/67  bone]
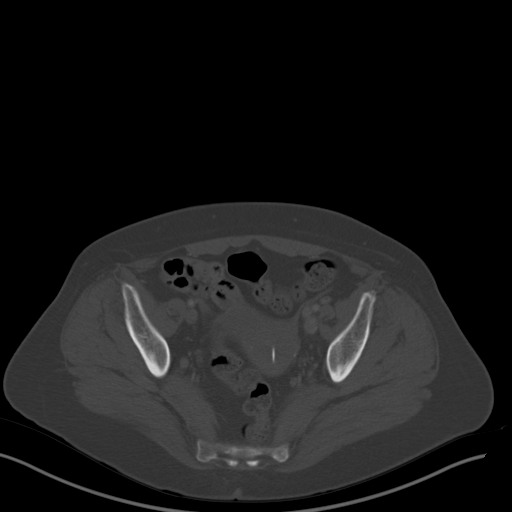
[im 52/67  soft-tissue]
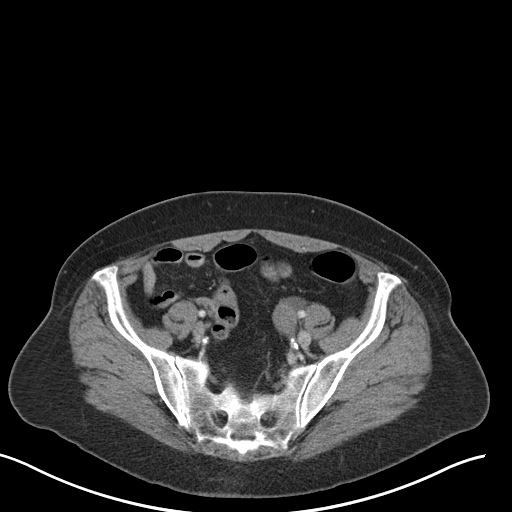
[im 58/67  soft-tissue]
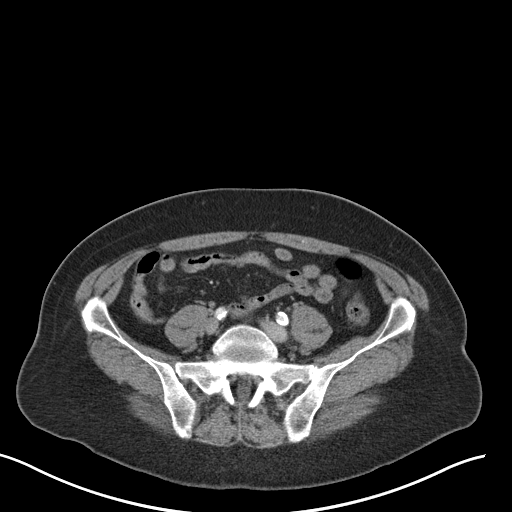
[im 62/67  soft-tissue]
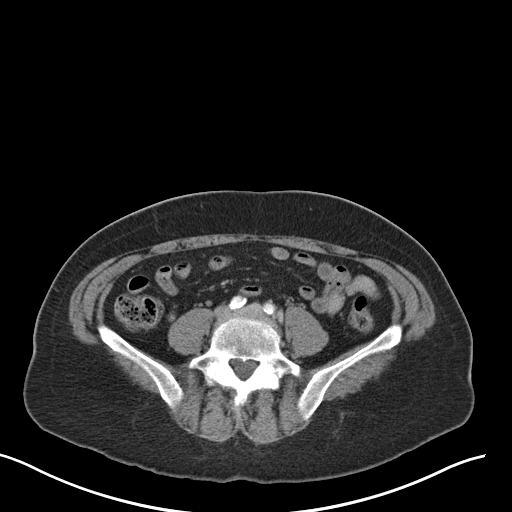

[Series 8: coronal st · coronal · 0.65mm/px · 3 of 112 slices shown]
[im 38/112  soft-tissue]
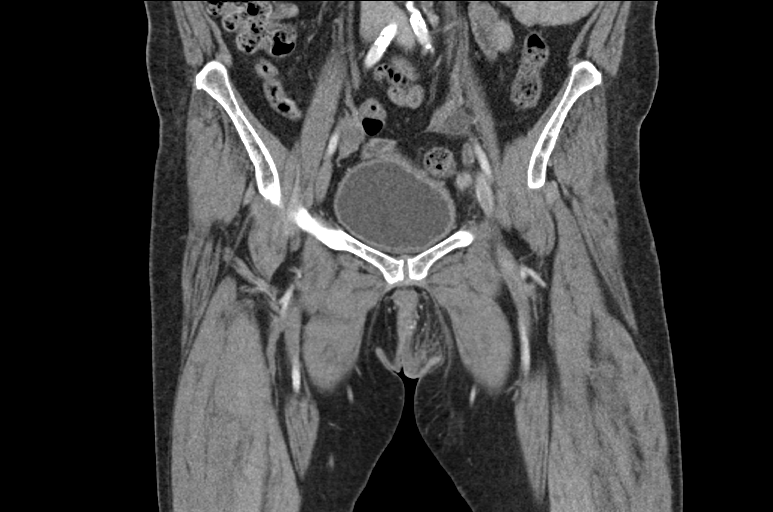
[im 50/112  soft-tissue]
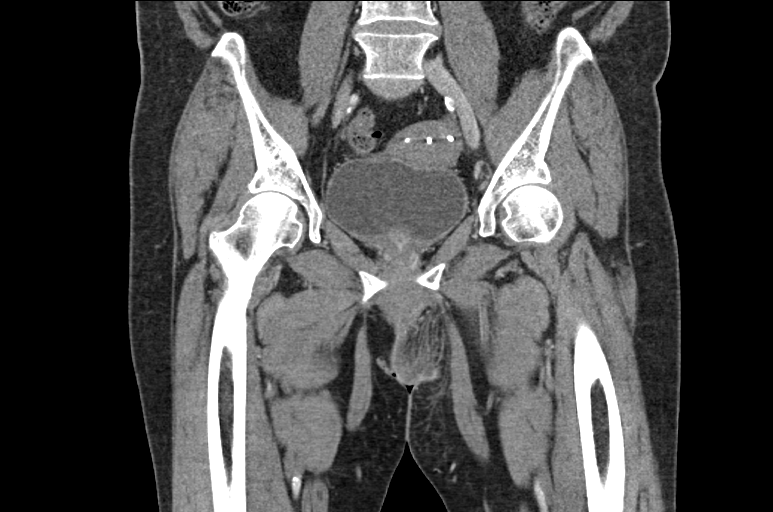
[im 62/112  soft-tissue]
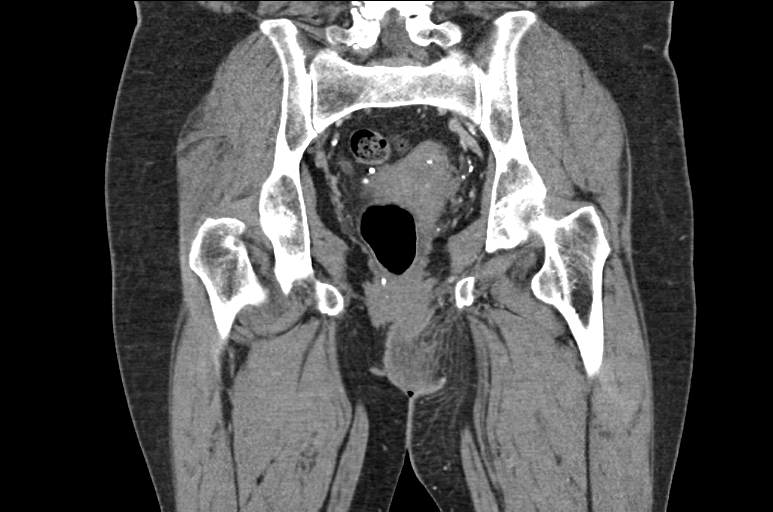

[15 of 46 positions shown; findings below may reference images not displayed]

FINDINGS: Urinary Tract:  Unremarkable where included

Bowel:  Unremarkable.  Normal appendix.

Vascular/Lymphatic: Aortoiliac atherosclerotic vascular disease.
cm left inguinal lymph node on image 35 series 2, likely reactive.
Reactive left external iliac lymph nodes.

Reproductive: T-shaped IUD projects centrally over the uterus.
Adnexa unremarkable.

Other: Abnormal inflammatory stranding along the left pubis, left
groin region, left labium majora, and left perineum extending to the
level of the gluteal fold. Subtle asymmetric stranding also extends
in the left medial thigh superiorly. No drainable abscess. No gas in
the soft tissues.

Musculoskeletal: No significant muscular involvement in the
inflammatory process is identified. No hip effusion.
IMPRESSION: 1. Abnormal inflammatory stranding along the left pubis, left groin
region, left labium majora, and left perineum extending to the level
of the gluteal fold and slightly into the left medial upper thigh.
No drainable abscess. No significant muscular involvement in the
inflammatory process. No visible gas in the soft tissues. Appearance
favors regional cellulitis. Please note that given the lack of
subcutaneous gas and the patient being female, Jiming gangrene
is considered unlikely, but if the patient has special risk factors
and/or rapid progression then close monitoring for fasciitis may be
warranted.
2. Reactive left inguinal and external iliac lymph nodes.
3. Aortoiliac atherosclerotic vascular disease.
4. T-shaped IUD projects centrally within the uterus.
5. Aortic atherosclerosis.

Aortic Atherosclerosis ([id number]).

## 2022-02-18 ENCOUNTER — Telehealth: Payer: Self-pay | Admitting: Cardiovascular Disease

## 2022-02-18 NOTE — Telephone Encounter (Signed)
Patient called to cancel RENAL [[id number]] Vascular test on 11/30.  Patient stated her son has seizures and she will call back to reschedule.

## 2022-02-19 ENCOUNTER — Ambulatory Visit (HOSPITAL_COMMUNITY): Payer: BC Managed Care – PPO

## 2022-04-12 ENCOUNTER — Other Ambulatory Visit: Payer: Self-pay | Admitting: Cardiovascular Disease

## 2022-04-13 ENCOUNTER — Other Ambulatory Visit: Payer: Self-pay

## 2022-04-16 ENCOUNTER — Other Ambulatory Visit: Payer: Self-pay | Admitting: *Deleted

## 2022-04-28 ENCOUNTER — Other Ambulatory Visit: Payer: Self-pay

## 2022-05-21 ENCOUNTER — Other Ambulatory Visit: Payer: Self-pay

## 2022-05-27 ENCOUNTER — Other Ambulatory Visit: Payer: Self-pay

## 2022-05-27 DIAGNOSIS — G44209 Tension-type headache, unspecified, not intractable: Secondary | ICD-10-CM

## 2022-06-02 ENCOUNTER — Other Ambulatory Visit: Payer: Self-pay | Admitting: *Deleted

## 2022-11-27 ENCOUNTER — Ambulatory Visit (HOSPITAL_COMMUNITY)
Admission: RE | Admit: 2022-11-27 | Discharge: 2022-11-27 | Disposition: A | Discharge status: Home / Self Care | Payer: Commercial Managed Care - HMO | Source: Ambulatory Visit | Attending: Cardiology | Admitting: Cardiology

## 2022-11-27 DIAGNOSIS — I701 Atherosclerosis of renal artery: Secondary | ICD-10-CM | POA: Insufficient documentation

## 2022-11-30 ENCOUNTER — Telehealth: Payer: Self-pay | Admitting: Emergency Medicine

## 2022-11-30 ENCOUNTER — Encounter: Payer: Self-pay | Admitting: Emergency Medicine

## 2022-11-30 NOTE — Telephone Encounter (Signed)
Called to inform the patient of results of Renal Artery Duplex and comments from Dr Royann Shivers. Voicemail not set up. Will mail results to the patient.  Croitoru, Mihai, MD  No significant renal artery stenosis

## 2023-05-20 ENCOUNTER — Other Ambulatory Visit: Payer: Self-pay | Admitting: Cardiovascular Disease

## 2023-06-16 ENCOUNTER — Other Ambulatory Visit: Payer: Self-pay

## 2023-06-16 MED ORDER — CLOPIDOGREL BISULFATE 75 MG PO TABS: 75.0000 mg | ORAL_TABLET | Freq: Every day | ORAL | 0 refills | Status: DC

## 2023-07-07 ENCOUNTER — Other Ambulatory Visit: Payer: Self-pay | Admitting: Cardiovascular Disease

## 2023-07-17 ENCOUNTER — Other Ambulatory Visit: Payer: Self-pay | Admitting: Cardiovascular Disease

## 2023-08-17 ENCOUNTER — Encounter: Payer: Self-pay | Admitting: Cardiovascular Disease

## 2023-08-17 ENCOUNTER — Telehealth: Payer: Self-pay | Admitting: Cardiovascular Disease

## 2023-08-17 MED ORDER — CLOPIDOGREL BISULFATE 75 MG PO TABS: 75.0000 mg | ORAL_TABLET | Freq: Every day | ORAL | 0 refills | Status: DC

## 2023-08-17 NOTE — Telephone Encounter (Signed)
 Error

## 2023-08-17 NOTE — Telephone Encounter (Signed)
 Pt's medication was sent to pt's pharmacy as requested. Confirmation received.

## 2023-08-17 NOTE — Telephone Encounter (Signed)
*  STAT* If patient is at the pharmacy, call can be transferred to refill team.   1. Which medications need to be refilled? (please list name of each medication and dose if known)   clopidogrel  (PLAVIX ) 75 MG tablet   2. Would you like to learn more about the convenience, safety, & potential cost savings by using the [facility]?   3. Are you open to using the [facility] (Type [facility]. ).  4. Which pharmacy/location (including street and city if local pharmacy) is medication to be sent to?  [facility] - [place],  - [address]   5. Do they need a 30 day or 90 day supply?   Patient stated she is completely out of this medication.  Patient has appointment scheduled with K. [facility] on 6/18.

## 2023-09-07 NOTE — Progress Notes (Deleted)
 Cardiology Office Note    Date:  09/07/2023  ID:  Sheri Perkins, DOB March 24, 1960, MRN [id number] PCP:  Lucius Sabins., MD  Cardiologist:  Luana Rumple, MD  Electrophysiologist:  None   Chief Complaint: ***  History of Present Illness: .    Sheri Perkins is a 63 y.o. female with visit-pertinent history of neurally mediated syncope, TIA, hypertension, hyperlipidemia, heart right renal artery stenosis presumed secondary to fibromuscular dysplasia and prior tobacco use.  Patient with history of renovascular hypertension with renal artery duplex in 2014 suggestive of moderate right coronary artery stenosis secondary to fibromuscular dysplasia.  History of prior TIA.  Carotid duplex in 2019 showed mild plaque.  Holter monitor in 2019 showed no evidence of atrial fibrillation.  Patient has history of aortic ejection murmur, echo in March 2019 showed aortic valve sclerosis without stenosis, EF 55 to 60%.  In September 2022 she was syncopal episode while waiting in line at a [employer].  ED evaluation including CT showed no intracranial abnormalities, BP was low at this time and losartan  was discontinued.  Patient was last in clinic on 11/26/2021 by Dr. Alvis Ba.  She had overall been stable from a cardiac standpoint.  Today she presents for follow-up.  She reports that she  H/o syncope: Patient with history of neurally mediated syncope, sometimes with typical triggers sometimes unclear as to cause of occurrence. Recommend stay well-hydrated and practicing orthostatic precautions.  Hypertension: Blood pressure today   Hyperlipidemia: Last lipid profile indicated  Renal artery stenosis: Renal artery duplex in 2014 suggestive of moderate right coronary artery stenosis secondary to fibromuscular dysplasia.  Repeat duplex in 11/2022 indicated bilateral 1 to 59% stenosis of the renal arteries.  H/o TIA: No recurrences.  Continue aspirin  and Plavix .  H/o tobacco use:   Labwork independently  reviewed:   ROS: .   *** denies chest pain, shortness of breath, lower extremity edema, fatigue, palpitations, melena, hematuria, hemoptysis, diaphoresis, weakness, presyncope, syncope, orthopnea, and PND.  All other systems are reviewed and otherwise negative.  Studies Reviewed: Sheri Perkins    EKG:  EKG is ordered today, personally reviewed, demonstrating ***     CV Studies: Cardiac studies reviewed are outlined and summarized above. Otherwise please see EMR for full report. Cardiac Studies & Procedures   ______________________________________________________________________________________________     ECHOCARDIOGRAM  ECHOCARDIOGRAM COMPLETE 05/28/2017  Narrative *[facility]* [address] [place], [place] [address] [phone]  ------------------------------------------------------------------- Transthoracic Echocardiography  Patient:    Sheri, Perkins MR #:       [id number] Study Date: 05/28/2017 Gender:     F Age:        15 Height:     152.4 cm Weight:     64.5 kg BSA:        1.67 m^2 Pt. Status: Room:  ATTENDING    [facility], Kaylene Pascal REFERRING    [place], Kaylene Pascal. PERFORMING   [facility], Outpatient SONOGRAPHER  [facility], [facility] REFERRING    Nadeen [place], [place]  cc:  ------------------------------------------------------------------- LV EF: 55% -   60%  ------------------------------------------------------------------- Indications:      Syncope (R55).  ------------------------------------------------------------------- History:   PMH:  Revnovascular Hypertension  Risk factors:  Current tobacco use. Dyslipidemia.  ------------------------------------------------------------------- Study Conclusions  - Left ventricle: The cavity size was normal. Wall thickness was normal. Systolic function was normal. The estimated ejection fraction was in the range of 55% to 60%. Wall motion was normal; there were no  regional wall motion  abnormalities. Left ventricular diastolic function parameters were normal.  Impressions:  - Normal study.  ------------------------------------------------------------------- Study data:  Comparison was made to the study of 06/16/2010.  Study status:  Routine.  Procedure:  Transthoracic echocardiography. Image quality was adequate.          Transthoracic echocardiography.  M-mode, complete 2D, 3D, spectral Doppler, and color Doppler.  Birthdate:  Patient birthdate: 05-10-1960.  Age: Patient is 63 yr old.  Sex:  Gender: female.    BMI: 27.8 kg/m^2. Blood pressure:     156/77  Patient status:  Outpatient.  Study date:  Study date: 05/28/2017. Study time: 11:38 AM.  Location: [facility]  -------------------------------------------------------------------  ------------------------------------------------------------------- Left ventricle:  The cavity size was normal. Wall thickness was normal. Systolic function was normal. The estimated ejection fraction was in the range of 55% to 60%. Wall motion was normal; there were no regional wall motion abnormalities. Left ventricular diastolic function parameters were normal.  ------------------------------------------------------------------- Aortic valve:   Trileaflet; mildly thickened leaflets. Mobility was not restricted.  Doppler:  Transvalvular velocity was within the normal range. There was no stenosis. There was no regurgitation.  ------------------------------------------------------------------- Aorta:  Aortic root: The aortic root was normal in size.  ------------------------------------------------------------------- Mitral valve:   Structurally normal valve.   Mobility was not restricted.  Doppler:  Transvalvular velocity was within the normal range. There was no evidence for stenosis. There was no regurgitation.    Peak gradient (D): 5 mm Hg.  ------------------------------------------------------------------- Left  atrium:  The atrium was normal in size.  ------------------------------------------------------------------- Right ventricle:  The cavity size was normal. Systolic function was normal.  ------------------------------------------------------------------- Pulmonic valve:    Doppler:  Transvalvular velocity was within the normal range. There was no evidence for stenosis.  ------------------------------------------------------------------- Tricuspid valve:   Structurally normal valve.    Doppler: Transvalvular velocity was within the normal range. There was trivial regurgitation.  ------------------------------------------------------------------- Right atrium:  The atrium was normal in size.  ------------------------------------------------------------------- Pericardium:  There was no pericardial effusion.  ------------------------------------------------------------------- Systemic veins: Inferior vena cava: The vessel was normal in size.  ------------------------------------------------------------------- Measurements  Left ventricle                         Value        Reference LV ID, ED, PLAX chordal        (L)     39.5  mm     43 - 52 LV ID, ES, PLAX chordal                23.2  mm     23 - 38 LV fx shortening, PLAX chordal         41    %      >=29 LV PW thickness, ED                    10.2  mm     ---------- IVS/LV PW ratio, ED                    0.95         <=1.3 Stroke volume, 2D                      81    ml     ---------- Stroke volume/bsa, 2D  48    ml/m^2 ---------- LV e&', lateral                         11    cm/s   ---------- LV E/e&', lateral                       10.09        ---------- LV e&', medial                          10.9  cm/s   ---------- LV E/e&', medial                        10.18        ---------- LV e&', average                         10.95 cm/s   ---------- LV E/e&', average                       10.14         ----------  Ventricular septum                     Value        Reference IVS thickness, ED                      9.64  mm     ----------  LVOT                                   Value        Reference LVOT ID, S                             20    mm     ---------- LVOT area                              3.14  cm^2   ---------- LVOT peak velocity, S                  130   cm/s   ---------- LVOT mean velocity, S                  74.5  cm/s   ---------- LVOT VTI, S                            25.9  cm     ---------- LVOT peak gradient, S                  7     mm Hg  ----------  Aorta                                  Value        Reference Aortic root ID, ED                     27    mm     ---------- Ascending  aorta ID, A-P, S             29    mm     ----------  Left atrium                            Value        Reference LA ID, A-P, ES                         37    mm     ---------- LA ID/bsa, A-P                 (H)     2.21  cm/m^2 <=2.2 LA volume, S                           31.7  ml     ---------- LA volume/bsa, S                       18.9  ml/m^2 ---------- LA volume, ES, 1-p A4C                 28.9  ml     ---------- LA volume/bsa, ES, 1-p A4C             17.3  ml/m^2 ---------- LA volume, ES, 1-p A2C                 34.2  ml     ---------- LA volume/bsa, ES, 1-p A2C             20.4  ml/m^2 ----------  Mitral valve                           Value        Reference Mitral E-wave peak velocity            111   cm/s   ---------- Mitral A-wave peak velocity            83.4  cm/s   ---------- Mitral deceleration time               225   ms     150 - 230 Mitral peak gradient, D                5     mm Hg  ---------- Mitral E/A ratio, peak                 1.3          ----------  Right atrium                           Value        Reference RA ID, S-I, ES, A4C                    47.9  mm     34 - 49 RA area, ES, A4C                       11.4  cm^2   8.3 - 19.5 RA volume, ES, A/L                      22.5  ml     ----------  RA volume/bsa, ES, A/L                 13.4  ml/m^2 ----------  Right ventricle                        Value        Reference TAPSE                                  17.8  mm     ---------- RV s&', lateral, S                      14.4  cm/s   ----------  Legend: (L)  and  (H)  mark values outside specified reference range.  ------------------------------------------------------------------- Prepared and Electronically Authenticated by  Alexandria Angel 2019-03-08T14:26:36    MONITORS  CARDIAC EVENT MONITOR 02/04/2018  Narrative  Sinus rhythm is seen on all recordings.  Normal event monitor.       ______________________________________________________________________________________________       Current Reported Medications:.    No outpatient medications have been marked as taking for the 09/08/23 encounter (Appointment) with Rea Reser D, NP.    Physical Exam:    VS:  There were no vitals taken for this visit.   Wt Readings from Last 3 Encounters:  11/26/21 142 lb 3.2 oz (64.5 kg)  06/18/21 142 lb 12.8 oz (64.8 kg)  01/22/21 144 lb 9.6 oz (65.6 kg)    GEN: Well nourished, well developed in no acute distress NECK: No JVD; No carotid bruits CARDIAC: ***RRR, no murmurs, rubs, gallops RESPIRATORY:  Clear to auscultation without rales, wheezing or rhonchi  ABDOMEN: Soft, non-tender, non-distended EXTREMITIES:  No edema; No acute deformity     Asessement and Plan:.     ***     Disposition: F/u with ***  Signed, Shae Augello D Sem Mccaughey, NP

## 2023-09-08 ENCOUNTER — Ambulatory Visit: Attending: Cardiology | Admitting: Cardiology

## 2023-09-08 DIAGNOSIS — I701 Atherosclerosis of renal artery: Secondary | ICD-10-CM

## 2023-09-08 DIAGNOSIS — E785 Hyperlipidemia, unspecified: Secondary | ICD-10-CM

## 2023-09-08 DIAGNOSIS — R55 Syncope and collapse: Secondary | ICD-10-CM

## 2023-09-08 DIAGNOSIS — Z8673 Personal history of transient ischemic attack (TIA), and cerebral infarction without residual deficits: Secondary | ICD-10-CM

## 2023-09-08 DIAGNOSIS — I10 Essential (primary) hypertension: Secondary | ICD-10-CM

## 2023-09-13 ENCOUNTER — Other Ambulatory Visit: Payer: Self-pay | Admitting: Cardiovascular Disease

## 2023-09-23 ENCOUNTER — Other Ambulatory Visit (HOSPITAL_COMMUNITY): Payer: Self-pay

## 2023-09-23 ENCOUNTER — Other Ambulatory Visit: Payer: Self-pay | Admitting: Cardiovascular Disease

## 2023-09-23 ENCOUNTER — Telehealth: Payer: Self-pay | Admitting: Cardiovascular Disease

## 2023-09-23 MED ORDER — CLOPIDOGREL BISULFATE 75 MG PO TABS
75.0000 mg | ORAL_TABLET | Freq: Every day | ORAL | 0 refills | Status: DC
  Filled 2023-09-23: qty 30, 30d supply, fill #0

## 2023-09-23 MED ORDER — CLOPIDOGREL BISULFATE 75 MG PO TABS: 75.0000 mg | ORAL_TABLET | Freq: Every day | ORAL | 0 refills | Status: DC

## 2023-09-23 NOTE — Addendum Note (Signed)
 Addended by: Lanita Stammen on: 09/23/2023 05:12 PM   Modules accepted: Orders

## 2023-09-23 NOTE — Telephone Encounter (Signed)
 Clopidogrel  refill faxed to the patient's pharmacy.

## 2023-09-23 NOTE — Telephone Encounter (Signed)
*  STAT* If patient is at the pharmacy, call can be transferred to refill team.   1. Which medications need to be refilled? (please list name of each medication and dose if known)   clopidogrel  (PLAVIX ) 75 MG tablet   2. Would you like to learn more about the convenience, safety, & potential cost savings by using the [facility]?   3. Are you open to using the [facility] (Type [facility]. ).  4. Which pharmacy/location (including street and city if local pharmacy) is medication to be sent to?  [facility] - [place], [place] - [address]   5. Do they need a 30 day or 90 day supply?     Patient stated she only has 1-2 tablets left.  Patient has appointment scheduled with A. Duke, PA on 7/22.

## 2023-10-04 NOTE — Progress Notes (Deleted)
 Cardiology Office Note:    Date:  10/04/2023   ID:  Sheri Perkins, DOB 01/21/1961, MRN [id number]  PCP:  Sabas Norleen PARAS., MD   [facility] Providers Cardiologist:  Jerel Balding, MD { Click to update primary MD,subspecialty MD or APP then REFRESH:1}    Referring MD: Sabas Norleen PARAS., MD   No chief complaint on file. ***  History of Present Illness:    Sheri Perkins is a 63 y.o. female with a hx of history of neurally mediated syncope, TIA, hypertension, hyperlipidemia, heart right renal artery stenosis presumed secondary to fibromuscular dysplasia and prior tobacco use.  Patient with history of renovascular hypertension with renal artery duplex in 2014 suggestive of moderate right coronary artery stenosis secondary to fibromuscular dysplasia.  History of prior TIA.  Carotid duplex in 2019 showed mild plaque.  Holter monitor in 2019 showed no evidence of atrial fibrillation.  Patient has history of aortic ejection murmur, echo in March 2019 showed aortic valve sclerosis without stenosis, EF 55 to 60%.  In September 2022 she was syncopal episode while waiting in line at a [employer].  ED evaluation including CT showed no intracranial abnormalities, BP was low at this time and losartan  was discontinued.  Patient was last in clinic on 11/26/2021 by Dr. Balding.  She had overall been stable from a cardiac standpoint.  Today she presents for follow-up.  She reports that she  H/o syncope: Patient with history of neurally mediated syncope, sometimes with typical triggers sometimes unclear as to cause of occurrence. Recommend stay well-hydrated and practicing orthostatic precautions.  Hypertension: Blood pressure today   Hyperlipidemia: Last lipid profile indicated  Renal artery stenosis: Renal artery duplex in 2014 suggestive of moderate right coronary artery stenosis secondary to fibromuscular dysplasia.  Repeat duplex in 11/2022 indicated bilateral 1 to 59% stenosis of the  renal arteries.  H/o TIA: No recurrences.  Continue aspirin  and Plavix .  H/o tobacco use:   Past Medical History:  Diagnosis Date   Acute bronchiolitis    Allergy    Arthritis    fingers , back, neck    Hyperlipidemia    Renal artery stenosis (HCC) 04/03/2011   right 1-59% proximal reduction,left main 1-59% reduction,   Renovascular hypertension    TIA (transient ischemic attack)    Tobacco abuse     Past Surgical History:  Procedure Laterality Date   arm surgery Bilateral    bilateral carpal tunnel surgery     CARPAL TUNNEL RELEASE Bilateral    COLONOSCOPY     FL INJ LEFT KNEE CT ARTHROGRAM ([facility] HX)     HAND SURGERY     INCISION AND DRAINAGE PERIRECTAL ABSCESS N/A 06/11/2020   Procedure: IRRIGATION AND DEBRIDEMENT PERIRECTAL ABSCESS LYTHOTOMY;  Surgeon: Ebbie Cough, MD;  Location: [facility];  Service: General;  Laterality: N/A;   KNEE SURGERY     POLYPECTOMY     SHOULDER SURGERY Left    surgery to right thumb     ulnar nerve relocation bilaterally     US  ECHOCARDIOGRAPHY  06/16/2010   normal    Current Medications: No outpatient medications have been marked as taking for the 10/12/23 encounter (Appointment) with Madie Jon Garre, PA.     Allergies:   Macrobid [nitrofurantoin macrocrystal], Latex, and Sulfa antibiotics   Social History   Socioeconomic History   Marital status: Married    Spouse name: Not on file   Number of children: Not on file   Years of education: Not  on file   Highest education level: Not on file  Occupational History   Not on file  Tobacco Use   Smoking status: Some Days    Current packs/day: 0.00    Types: Cigarettes    Last attempt to quit: 04/03/2017    Years since quitting: 6.5   Smokeless tobacco: Never   Tobacco comments:    11/26/2021 Patient smokes cigarettes occassionally with a pack lasting about two weeks.  Vaping Use   Vaping status: Never Used  Substance and Sexual Activity   Alcohol use: No    Alcohol/week: 0.0  standard drinks of alcohol   Drug use: No   Sexual activity: Not on file  Other Topics Concern   Not on file  Social History Narrative   Not on file   Social Drivers of Health   Financial Resource Strain: Not on file  Food Insecurity: Not on file  Transportation Needs: Not on file  Physical Activity: Not on file  Stress: Not on file  Social Connections: Not on file     Family History: The patient's ***family history includes Melanoma in her sister; Seizures in her sister. There is no history of Colon cancer, Colon polyps, Rectal cancer, or Stomach cancer.  ROS:   Please see the history of present illness.    *** All other systems reviewed and are negative.  EKGs/Labs/Other Studies Reviewed:    The following studies were reviewed today: ***      Recent Labs: No results found for requested labs within last 365 days.  Recent Lipid Panel    Component Value Date/Time   CHOL 126 08/25/2018 1345   TRIG 90 08/25/2018 1345   HDL 47 08/25/2018 1345   CHOLHDL 2.7 08/25/2018 1345   CHOLHDL 4.1 05/10/2015 0910   VLDL 15 05/10/2015 0910   LDLCALC 61 08/25/2018 1345     Risk Assessment/Calculations:   {Does this patient have ATRIAL FIBRILLATION?:[phone]}  No BP recorded.  {Refresh Note OR Click here to enter BP  :1}***         Physical Exam:    VS:  There were no vitals taken for this visit.    Wt Readings from Last 3 Encounters:  11/26/21 142 lb 3.2 oz (64.5 kg)  06/18/21 142 lb 12.8 oz (64.8 kg)  01/22/21 144 lb 9.6 oz (65.6 kg)     GEN: *** Well nourished, well developed in no acute distress HEENT: Normal NECK: No JVD; No carotid bruits LYMPHATICS: No lymphadenopathy CARDIAC: ***RRR, no murmurs, rubs, gallops RESPIRATORY:  Clear to auscultation without rales, wheezing or rhonchi  ABDOMEN: Soft, non-tender, non-distended MUSCULOSKELETAL:  No edema; No deformity  SKIN: Warm and dry NEUROLOGIC:  Alert and oriented x 3 PSYCHIATRIC:  Normal affect    ASSESSMENT:    No diagnosis found. PLAN:    In order of problems listed above:  ***      {Are you ordering a CV Procedure (e.g. stress test, cath, DCCV, TEE, etc)?   Press F2        :[id number]}    Medication Adjustments/Labs and Tests Ordered: Current medicines are reviewed at length with the patient today.  Concerns regarding medicines are outlined above.  No orders of the defined types were placed in this encounter.  No orders of the defined types were placed in this encounter.   There are no Patient Instructions on file for this visit.   Signed, Jon Nat Hails, [place]  10/04/2023 2:19 PM    [facility]

## 2023-10-11 NOTE — Progress Notes (Unsigned)
 Cardiology Office Note:  .   Date:  10/12/2023  ID:  Sheri Perkins, DOB 12/03/1960, MRN [id number] PCP: Sheri Norleen PARAS., MD  [facility] Providers Cardiologist:  Jerel Balding, MD {  History of Present Illness: .   Sheri Perkins is a 63 y.o. female  with PMHx of 60-99% right renal artery stenosis presumed secondary to fibromuscular dysplasia (Renal ultrasound 11/2022 no significant renal artery stenosis with bilateral 1-59% stenosis of renal arteries.), neurally mediated syncope, tobacco use, HTN, HLD who reports to [facility] office for overdue 1 year follow-up.  Last seen in heartcare 11/26/2021 with Dr. Balding for follow-up.  Reported 2 episodes of syncope.  Both was associated with typical prodrome warning signs including dizziness and flushing.  Both events were brief and she recovered fully after laying down.  Otherwise, denied any other cardiac symptoms.  Noted being very emotional stress due to her son's illness (he had to have testicular surgery and during recovery was discovered to have a small intracranial hemorrhage causing seizures).  Noted that she stopped taking HTN medication last year due to low BP.  BP in office elevated to 160/80 but reported home BP in low 120s.  Suspect BP elevated related to use of NSAID's and prednisone. Recommended BP log, which was not returned.  Follow-up renal ultrasound 11/2022 showed no significant renal artery stenosis with bilateral 1-59% stenosis of renal arteries.   Today, denies any recurrent syncope, chest pain, palpitations, edema, SOB.  Home BP 140-150/70-80's. She is requesting to restart Losartan . Notes she previously stopped taking Losartan  related to syncopal episodes and low BP. She is under a lot of stress taking care of husband and son. Reports compliance with medications. Report low sodium diet. Able to walk around grocery store without any exertional concerns. Smoke 1 PPD. Denies Binging ETOH/drug use. Denies any  hospitalizations or visits to the emergency department.   ROS: 10 point review of system has been reviewed and considered negative except ones been listed in the HPI.   Studies Reviewed: SABRA   ECHO 05/2017 Study Conclusions  - Left ventricle: The cavity size was normal. Wall thickness was    normal. Systolic function was normal. The estimated ejection    fraction was in the range of 55% to 60%. Wall motion was normal;    there were no regional wall motion abnormalities. Left    ventricular diastolic function parameters were normal.   Impressions: - Normal study.   Carotid US  01/2018 Summary:  Right Carotid: Velocities in the right ICA are consistent with a 1-39%  stenosis. Non-hemodynamically significant plaque <50% noted in the  CCA.  Left Carotid: Velocities in the left ICA are consistent with a 1-39%  stenosis.  Non-hemodynamically significant plaque noted in the CCA.   Vertebrals:  Bilateral vertebral arteries demonstrate antegrade flow.  Subclavians: Right subclavian artery flow was disturbed. Normal flow               hemodynamics were seen in the left subclavian artery.   US  Renal Artery 11/2022 Summary:  Renal:  Right: Normal size right kidney. 1-59% stenosis of the right renal         artery. RRV flow present. Abnormal right Resistive Index.         Normal cortical thickness of right kidney.  Left:  Normal size of left kidney. LRV flow present. 1-59% stenosis         of the left renal artery. Normal cortical thickness of the  left kidney. Abnormal left Resisitve Index.   EKG Interpretation Date/Time:  Tuesday October 12 2023 10:12:31 EDT Ventricular Rate:  58 PR Interval:  162 QRS Duration:  82 QT Interval:  428 QTC Calculation: 420 R Axis:   60  Text Interpretation: Sinus bradycardia When compared with ECG of 17-Nov-2020 14:56, PREVIOUS ECG IS PRESENT Confirmed by Sheron Hallmark ([id number]) on 10/12/2023 10:43:43 AM   Physical Exam:   VS:  BP 138/80 (BP  Location: Left Arm, Patient Position: Sitting, Cuff Size: Normal)   Pulse (!) 58   Ht 5' (1.524 m)   Wt 144 lb (65.3 kg)   SpO2 99%   BMI 28.12 kg/m    Wt Readings from Last 3 Encounters:  10/12/23 144 lb (65.3 kg)  11/26/21 142 lb 3.2 oz (64.5 kg)  06/18/21 142 lb 12.8 oz (64.8 kg)    GEN: Well nourished, well developed in no acute distress while sitting in chair.  NECK: No JVD; No carotid bruits CARDIAC: RRR, no murmurs, rubs, gallops RESPIRATORY:  Clear to auscultation without rales, wheezing or rhonchi  ABDOMEN: Soft, non-tender, non-distended EXTREMITIES:  No edema; No deformity   ASSESSMENT AND PLAN: .   Essential hypertension Previously on losartan . Stopped taking HTN medication in 2022 due to low BP.  BP in office today, mildly elevated: 142/80, repeat BP 138/80 Home BP 140-150/70-80's. She is requesting to restart Losartan . Notes she previously stopped taking Losartan  related to syncopal episodes and low BP. Also, endorse being under stress due to caring for husband and son.  Continue Atenolol  25 mg daily.  Discussed that Losartan  requires lab monitoring today and 1 week. Offered amlodipine , which does not require any lab monitoring. Patient prefers to start amlodipine .  Order Amlodipine  2.5 mg daily, BP log x 2 weeks. Can consider increasing to 5 mg if indicated.  Encourage to increase daily hydration to 64 oz of water.  Discussed limiting sodium intake to < 2 g  daily.   Renal artery stenosis  Renovascular hypertension Hx of 60-99% right renal artery stenosis presumed secondary to fibromuscular dysplasia.  Renal ultrasound 11/2022 no significant renal artery stenosis with bilateral 1-59% stenosis of renal arteries.  Recommended 1 year repeat study. Order follow-up renal ultrasound for first available with elevated BP as above lately.  Neurocardiogenic syncope Previously noted that condition is lifelong, sometimes with typical triggers, sometimes unclear why it occurs.   Denies any recurrent syncopal episodes since last office visit. Encourage to stay well-hydrated.  Avoid standing still without moving for long periods of times.  Avoid known triggers. Previously noted not able to recommend a salt rich diet due to her HTN.  Hyperlipidemia LDL goal <70 07/2022 LDL 91, LFT WNL Increase Lipitor from 20 mg  to 40 mg daily. Order FLP and CMP for  6 weeks.   History of TIA (transient ischemic attack) No recent occurrences. Continue on Plavix  75 mg daily  Tobacco Use Smoke 1 PPD.  Discussed cessation but not  interested. She has tried nicotine  replacement medications in the past but did not help.     Dispo: Follow up in 6 weeks with Angie  Signed, Effrey Davidow Y Lysandra Loughmiller, PA-C

## 2023-10-12 ENCOUNTER — Ambulatory Visit: Attending: Cardiology | Admitting: Physician Assistant

## 2023-10-12 ENCOUNTER — Encounter: Payer: Self-pay | Admitting: Physician Assistant

## 2023-10-12 VITALS — BP 138/80 | HR 58 | Ht 60.0 in | Wt 144.0 lb

## 2023-10-12 DIAGNOSIS — I10 Essential (primary) hypertension: Secondary | ICD-10-CM | POA: Diagnosis not present

## 2023-10-12 DIAGNOSIS — Z720 Tobacco use: Secondary | ICD-10-CM | POA: Insufficient documentation

## 2023-10-12 DIAGNOSIS — E785 Hyperlipidemia, unspecified: Secondary | ICD-10-CM | POA: Insufficient documentation

## 2023-10-12 DIAGNOSIS — R55 Syncope and collapse: Secondary | ICD-10-CM | POA: Diagnosis present

## 2023-10-12 DIAGNOSIS — Z8673 Personal history of transient ischemic attack (TIA), and cerebral infarction without residual deficits: Secondary | ICD-10-CM | POA: Insufficient documentation

## 2023-10-12 DIAGNOSIS — I701 Atherosclerosis of renal artery: Secondary | ICD-10-CM | POA: Insufficient documentation

## 2023-10-12 DIAGNOSIS — I150 Renovascular hypertension: Secondary | ICD-10-CM | POA: Insufficient documentation

## 2023-10-12 MED ORDER — ATENOLOL 25 MG PO TABS: 25.0000 mg | ORAL_TABLET | Freq: Every day | ORAL | 1 refills | Status: DC

## 2023-10-12 MED ORDER — AMLODIPINE BESYLATE 2.5 MG PO TABS: 2.5000 mg | ORAL_TABLET | Freq: Every day | ORAL | 3 refills | Status: DC

## 2023-10-12 MED ORDER — ATORVASTATIN CALCIUM 40 MG PO TABS: 40.0000 mg | ORAL_TABLET | Freq: Every day | ORAL | 3 refills | Status: AC

## 2023-10-12 NOTE — Patient Instructions (Signed)
 Medication Instructions:  Take Amlodipine  2.5mg  daily.  Increase the Lipitor from 20mg  to 40mg  dialy. This medication has been sent to your pharmacy.  A refill of the Atenolol   25mg  has been sent to your pharmacy.  *If you need a refill on your cardiac medications before your next appointment, please call your pharmacy*   Lab Work: Return is 6 weeks for fasting labs.................SABRA LIPID, CMET If you have labs (blood work) drawn today and your tests are completely normal, you will receive your results only by: MyChart Message (if you have MyChart) OR A paper copy in the mail If you have any lab test that is abnormal or we need to change your treatment, we will call you to review the results.   Testing/Procedures: Your physician has requested that you have a renal artery duplex. During this test, an ultrasound is used to evaluate blood flow to the kidneys. Allow one hour for this exam. Do not eat after midnight the day before and avoid carbonated beverages. Take your medications as you usually do.    Follow-Up: At [facility], you and your health needs are our priority.  As part of our continuing mission to provide you with exceptional heart care, we have created designated Provider Care Teams.  These Care Teams include your primary Cardiologist (physician) and Advanced Practice Providers (APPs -  Physician Assistants and Nurse Practitioners) who all work together to provide you with the care you need, when you need it.  We recommend signing up for the patient portal called MyChart.  Sign up information is provided on this After Visit Summary.  MyChart is used to connect with patients for Virtual Visits (Telemedicine).  Patients are able to view lab/test results, encounter notes, upcoming appointments, etc.  Non-urgent messages can be sent to your provider as well.   To learn more about what you can do with MyChart, go to [email or web address].    Your next appointment:    6 week(s)  Provider:   Jon Hails, PA-C          Other Instructions Thank you for choosing [facility]!

## 2023-10-13 ENCOUNTER — Encounter: Payer: Self-pay | Admitting: Physician Assistant

## 2023-10-21 ENCOUNTER — Ambulatory Visit: Payer: Self-pay | Admitting: Physician Assistant

## 2023-10-21 ENCOUNTER — Ambulatory Visit (HOSPITAL_COMMUNITY)
Admission: RE | Admit: 2023-10-21 | Discharge: 2023-10-21 | Disposition: A | Discharge status: Home / Self Care | Source: Ambulatory Visit | Attending: Physician Assistant | Admitting: Physician Assistant

## 2023-10-21 DIAGNOSIS — I701 Atherosclerosis of renal artery: Secondary | ICD-10-CM | POA: Diagnosis present

## 2023-10-30 ENCOUNTER — Other Ambulatory Visit: Payer: Self-pay | Admitting: Cardiovascular Disease

## 2023-11-24 ENCOUNTER — Encounter: Payer: Self-pay | Admitting: Emergency Medicine

## 2023-11-24 ENCOUNTER — Ambulatory Visit: Attending: Cardiovascular Disease | Admitting: Emergency Medicine

## 2023-11-24 VITALS — BP 136/68 | HR 61 | Ht 60.0 in | Wt 137.2 lb

## 2023-11-24 DIAGNOSIS — I701 Atherosclerosis of renal artery: Secondary | ICD-10-CM | POA: Diagnosis present

## 2023-11-24 DIAGNOSIS — G4730 Sleep apnea, unspecified: Secondary | ICD-10-CM | POA: Insufficient documentation

## 2023-11-24 DIAGNOSIS — I6523 Occlusion and stenosis of bilateral carotid arteries: Secondary | ICD-10-CM | POA: Insufficient documentation

## 2023-11-24 DIAGNOSIS — R55 Syncope and collapse: Secondary | ICD-10-CM | POA: Diagnosis present

## 2023-11-24 DIAGNOSIS — E785 Hyperlipidemia, unspecified: Secondary | ICD-10-CM | POA: Insufficient documentation

## 2023-11-24 DIAGNOSIS — Z720 Tobacco use: Secondary | ICD-10-CM | POA: Diagnosis present

## 2023-11-24 DIAGNOSIS — Z8673 Personal history of transient ischemic attack (TIA), and cerebral infarction without residual deficits: Secondary | ICD-10-CM | POA: Insufficient documentation

## 2023-11-24 DIAGNOSIS — I10 Essential (primary) hypertension: Secondary | ICD-10-CM | POA: Diagnosis not present

## 2023-11-24 MED ORDER — AMLODIPINE BESYLATE 5 MG PO TABS: 5.0000 mg | ORAL_TABLET | Freq: Every day | ORAL | 3 refills | Status: AC

## 2023-11-24 MED ORDER — ATENOLOL 25 MG PO TABS: 25.0000 mg | ORAL_TABLET | Freq: Every day | ORAL | 3 refills | Status: AC

## 2023-11-24 NOTE — Patient Instructions (Addendum)
 Medication Instructions:  INCREASE AMLODIPINE  TO 5 MG DAILY. CONTINUE ALL OTHER CURRENT MEDICATION THERAPY.  Lab Work: CMET, CBC, AND FASTING LIPID PANEL TO BE DONE TODAY.   Testing/Procedures: WatchPAT? is an FDA-cleared portable home sleep study test that uses a watch and 3 points of contact to monitor 7 different channels, including your heart rate, oxygen saturation, body position, snoring, and chest motion.  The study is easy to use from the comfort of your own home and accurately detect sleep apnea.  Before bed, you attach the chest sensor, attached the sleep apnea bracelet to your nondominant hand, and attach the finger probe.  After the study, the raw data is downloaded from the watch and scored for apnea events.   For more information: [email or web address]  Patient Testing Instructions:  Once our office has received insurance approval for you to complete this test, we will contact you with a PIN to activate the device.  This typically takes 2-3 weeks.  Please do not open the box until approved.   Do not put battery into the device until bedtime when you are ready to begin the test. Please call the support number if you need assistance after following the instructions below: 24 hour support line- [phone] or [facility] support at [phone] (option 2)  Download the [facility] WatchPAT One app through the [employer] or [employer]. Be sure to turn on or enable access to Bluetooth in settings on your smartphone. Make sure no other Bluetooth devices are on and within the vicinity of your smartphone and WatchPAT watch during testing.  Make sure to leave your smart phone plugged in and charging all night.  When ready for bed:  Follow the instructions step by step in the WatchPAT One app to activate the testing device. For additional instructions, including video instruction, visit the WatchPAT One video on Youtube. You can search for WatchPAT One within Youtube  (video is 4 minutes and 18 seconds) or enter: https://youtube/watch?[email or web address]_vbiwxE Please note: You will be prompted to enter a PIN to connect via Bluetooth when starting the test. The PIN will be assigned to you after insurance has approved the test.  The device is disposable, but it recommended that you retain the device until you receive a call letting you know the study has been received and the results have been interpreted.  We will let you know if the study did not transmit to us  properly after the test is completed. You do not need to call us  to confirm the receipt of the test.  Please complete the test within 48 hours of receiving PIN.   Frequently Asked Questions:  What is Watch PAT One?  A single use, fully disposable home sleep apnea testing device and will not need to be returned after completion.  What are the requirements to use WatchPAT One?  A successful WatchPAT One sleep study requires a WatchPAT One device, your smart phone, WatchPAT One app, your PIN number, and internet access. What type of phone do I need?  You should have a smart phone that uses Android 5.1 and above or any iPhone with IOS 10 and above. How can I download the WatchPAT one app?  Based on your device type search for WatchPAT One app either in [employer] for [employer] or [employer] for [employer]. Where will I get my PIN for the study?  Your PIN will be provided by your physician's office after insurance has approved the test. This process typically takes  2-3 weeks. It is used for authentication and if you lose/forget your PIN, please reach out to your provider's office.  I do not have internet at home. Can I still complete a WatchPAT One study?  WatchPAT One needs internet connection throughout the night to be able to transmit the sleep data. You can use your home/local internet or your cellular data package. However, it is always recommended to use home/local internet. It is estimated that  between 20MB-30MB of data will be used with each study, but the application will be looking for space in the phone to start the study.  What happens if I lose internet or Bluetooth connection?  During the internet disconnection, your phone will not be able to transmit the sleep data.  All the data, will be stored in your phone.  As soon as the internet connection is back on, the phone will resume sending the sleep data. During the Bluetooth disconnection, WatchPAT One will not be able to to send the sleep data to your phone.  Data will be kept in the WatchPAT One until both devices have Bluetooth connection back on.  As soon as the connection is back on, WatchPAT one will send the sleep data to the phone.  How long do I need to wear the WatchPAT one?  After you start the study, you should wear the device at least 6 hours.  How far should I keep my phone from the device?  During the night, your phone should remain within 15 feet of where you sleep.  What happens if I leave the room for restroom or other reasons?  Leaving the room for any reason will not cause any problem. As soon as your get back to the room, both devices will reconnect and will continue to send the sleep data. Can I use my phone during the sleep study?  Yes, you can use your phone as usual during the study. But it is recommended to put your WatchPAT One on when you are ready to go to bed.  How will I get my study results?  A soon as you completed your study, your sleep data will be sent to the provider. They will then share the results with you when they are ready.   Your physician has requested that you have a carotid duplex. This test is an ultrasound of the carotid arteries in your neck. It looks at blood flow through these arteries that supply the brain with blood. Allow one hour for this exam. There are no restrictions or special instructions.    Follow-Up: At [facility], you and your health needs are our  priority.  As part of our continuing mission to provide you with exceptional heart care, our providers are all part of one team.  This team includes your primary Cardiologist (physician) and Advanced Practice Providers or APPs (Physician Assistants and Nurse Practitioners) who all work together to provide you with the care you need, when you need it.  Your next appointment:   3 MONTHS  Provider:   Jerel Balding, MD OR Lum Louis, NP   We recommend signing up for the patient portal called MyChart.  Sign up information is provided on this After Visit Summary.  MyChart is used to connect with patients for Virtual Visits (Telemedicine).  Patients are able to view lab/test results, encounter notes, upcoming appointments, etc.  Non-urgent messages can be sent to your provider as well.   To learn more about what you can do  with MyChart, go to [email or web address].   Other Instructions PLEASE TAKE AND RECORD BOTH YOUR BLOOD PRESSURE AND HEART RATE EACH DAY.

## 2023-11-24 NOTE — Progress Notes (Signed)
 Cardiology Office Note:    Date:  11/24/2023  ID:  Sheri Perkins, DOB [birth year], MRN [id number] PCP: No primary care provider on file.  [facility] Providers Cardiologist:  Jerel Balding, MD Cardiology APP:  Rana Lum CROME, NP       Patient Profile:       Chief Complaint: 6-week follow-up History of Present Illness:  Sheri Perkins is a 63 y.o. female with visit-pertinent history of right renal artery stenosis presumed secondary to fibromuscular dysplasia, neurally mediated syncope, tobacco use, hypertension, hyperlipidemia, history of TIA  Seen by Dr. JAYSON on 11/26/2021 for follow-up.  Reported 2 episodes of syncope.  Both were associated with typical prodrome warning signs including dizziness and flushing.  The events were brief and she recovered fully after laying down.  She otherwise denied any cardiac symptoms.  She was noted to be under heavy emotional stress due to her son's illness.  I was also noted she stopped taking her hypertension medicine last year due to low BP.  Blood pressure in office visit was elevated to 160/80 reported home BP in the low 120s.  Suspect BP elevation related to use of NSAIDs and prednisone.  Follow-up renal ultrasound on 11/2022 showed no significant renal artery stenosis with bilateral 1-59% stenosis of renal arteries.  Patient was last seen in office on 10/12/2023.  She denied any recurrent syncope.  Home BPs range 140s to 150s.  She reported she is under a lot of stress taking care of her husband and son.  She was started on amlodipine  2.5 mg daily.  Her most recent LDL cholesterol 91, her Lipitor was increased from 20 to 40 mg.  Renal artery duplex 10/21/2023 with bilateral 1-59% stenosis of the renal arteries.   Discussed the use of AI scribe software for clinical note transcription with the patient, who gave verbal consent to proceed.  History of Present Illness Sheri Perkins is a 63 year old female with hypertension who presents for  follow-up.  Today patient tells me she is doing well.  She experiences posterior headaches approximately three times in the past month and a half, lasting about an hour and restricting head movement.  She denies any neurological symptoms during these episodes.  She reports that she snores and experiences daytime somnolence.  There are no recent falls or loss of consciousness. She manages hypertension with amlodipine  and atenolol .  Blood pressure readings at home vary from 130s to 150s. Amlodipine  was added in July for elevated blood pressure. She takes clopidogrel  and atorvastatin , which was increased to 40 mg for elevated cholesterol. A fall with loss of consciousness occurred a year and a half ago, with no similar episodes since. She has a 40-year smoking history.  She currently smokes 1 pack of cigarettes per day.  She experiences stress due to her husband's recent brain surgery and her son's seizures.  She is without any chest pains, dyspnea, orthopnea, PND, leg swelling, palpitations, melena, hematochezia, lightheadedness, dizziness.  Review of systems:  Please see the history of present illness. All other systems are reviewed and otherwise negative.      Studies Reviewed:        Renal artery duplex 10/21/2023 Right: 1-59% stenosis of the right renal artery. Normal size right         kidney. RRV flow present.  Left:  1-59% stenosis of the left renal artery. Normal size of left         kidney. LRV flow present.  Carotid duplex 01/31/2018 Right Carotid: Velocities in the right ICA are consistent with a 1-39%  stenosis.                Non-hemodynamically significant plaque <50% noted in the  CCA.   Left Carotid: Velocities in the left ICA are consistent with a 1-39%  stenosis.               Non-hemodynamically significant plaque noted in the CCA.   Vertebrals:  Bilateral vertebral arteries demonstrate antegrade flow.  Subclavians: Right subclavian artery flow was disturbed. Normal flow                hemodynamics were seen in the left subclavian artery.   Echocardiogram 05/28/2017 - Left ventricle: The cavity size was normal. Wall thickness was    normal. Systolic function was normal. The estimated ejection    fraction was in the range of 55% to 60%. Wall motion was normal;    there were no regional wall motion abnormalities. Left    ventricular diastolic function parameters were normal.   Impressions:   - Normal study.  Risk Assessment/Calculations:         STOP-Bang Score:  4       Physical Exam:   VS:  BP 136/68   Pulse 61   Ht 5' (1.524 m)   Wt 137 lb 3.2 oz (62.2 kg)   SpO2 97%   BMI 26.80 kg/m    Wt Readings from Last 3 Encounters:  11/24/23 137 lb 3.2 oz (62.2 kg)  10/12/23 144 lb (65.3 kg)  11/26/21 142 lb 3.2 oz (64.5 kg)    GEN: Well nourished, well developed in no acute distress NECK: No JVD; No carotid bruits CARDIAC: RRR, no murmurs, rubs, gallops RESPIRATORY:  Clear to auscultation without rales, wheezing or rhonchi  ABDOMEN: Soft, non-tender, non-distended EXTREMITIES:  No edema; No acute deformity      Assessment and Plan:  Hypertension Blood pressure today is 136/68 and not at goal Home blood pressures average 130s-150s Previously on losartan , stopped taking BP medication in 2022 due to low BP - Has a lot of stress caring for her husband and son - Plan to increase amlodipine  to 5 mg daily - Continue atenolol  25 mg daily - BP logs given today as patient will begin monitoring and documenting daily BP and bring into next office visit - Heart healthy dieting and tobacco cessation encouraged - CMET and CBC today   Renal artery stenosis Renal artery duplex in 2014 suggestive of moderate right artery stenosis 2/2 fibromuscular dysplasia Renal artery duplex 09/2023 with bilateral 1-59% stenosis to renal arteries - Stable.  Blood pressure has not been resistant - Continue atorvastatin  40 mg daily and Plavix  75 mg daily  Carotid artery  disease Carotid artery duplex 01/2018 with bilateral 1-39% ICA stenosis - No new neurological symptoms other than the occasional posterior headache in the mornings - Plan for repeat carotid duplex today for routine monitoring - Continue atorvastatin  40 mg daily and Plavix  75 mg  Hyperlipidemia, LDL goal <70 LDL 91 on 07/2022 and not well-controlled - Atorvastatin  recently increased on 7/22 - Plan for repeat fasting lipid panel and CMET today  Neurocardiogenic syncope Previously noted that this condition is lifelong, sometimes with typical triggers, sometimes unclear what occurs - She denies any recurrent syncopal episodes.  Last occurred a year and a half ago - Recommended adequate hydration and to avoid standing still without moving for long periods of time  History  of TIA Carotid duplex 01/2018 with mild plaque Holter monitor 01/2018 with no atrial fibrillation She has had no recent occurrences and no new neurological deficits - Managed on Plavix  75 mg daily  Tobacco use Currently smoking 1 pack/day - Tobacco cessation encouraged  Sleep disordered breathing Stop-bang score of 4 - She reports snoring and daytime somnolence - Plan for itamar sleep study today      Dispo:  Return in about 3 months (around 02/23/2024).  Signed, Lum LITTIE Louis, NP

## 2023-11-25 LAB — COMPREHENSIVE METABOLIC PANEL WITH GFR
ALT: 10 IU/L (ref 0–32)
AST: 16 IU/L (ref 0–40)
Albumin: 3.9 g/dL (ref 3.9–4.9)
Alkaline Phosphatase: 174 IU/L — ABNORMAL HIGH (ref 44–121)
BUN/Creatinine Ratio: 10 — ABNORMAL LOW (ref 12–28)
BUN: 7 mg/dL — ABNORMAL LOW (ref 8–27)
Bilirubin Total: 0.8 mg/dL (ref 0.0–1.2)
CO2: 20 mmol/L (ref 20–29)
Calcium: 9.4 mg/dL (ref 8.7–10.3)
Chloride: 109 mmol/L — ABNORMAL HIGH (ref 96–106)
Creatinine, Ser: 0.69 mg/dL (ref 0.57–1.00)
Globulin, Total: 2.7 g/dL (ref 1.5–4.5)
Glucose: 93 mg/dL (ref 70–99)
Potassium: 4.1 mmol/L (ref 3.5–5.2)
Sodium: 146 mmol/L — ABNORMAL HIGH (ref 134–144)
Total Protein: 6.6 g/dL (ref 6.0–8.5)
eGFR: 97 mL/min/1.73 (ref >59)

## 2023-11-25 LAB — LIPID PANEL
Chol/HDL Ratio: 3.4 ratio (ref 0.0–4.4)
Cholesterol, Total: 126 mg/dL (ref 100–199)
HDL: 37 mg/dL — ABNORMAL LOW (ref >39)
LDL Chol Calc (NIH): 75 mg/dL (ref 0–99)
Triglycerides: 67 mg/dL (ref 0–149)
VLDL Cholesterol Cal: 14 mg/dL (ref 5–40)

## 2023-11-25 LAB — CBC
Hematocrit: 43.9 % (ref 34.0–46.6)
Hemoglobin: 14.3 g/dL (ref 11.1–15.9)
MCH: 30.2 pg (ref 26.6–33.0)
MCHC: 32.6 g/dL (ref 31.5–35.7)
MCV: 93 fL (ref 79–97)
Platelets: 185 x10E3/uL (ref 150–450)
RBC: 4.74 x10E6/uL (ref 3.77–5.28)
RDW: 13.2 % (ref 11.7–15.4)
WBC: 7.3 x10E3/uL (ref 3.4–10.8)

## 2023-11-28 ENCOUNTER — Ambulatory Visit: Payer: Self-pay | Admitting: Emergency Medicine

## 2023-12-02 ENCOUNTER — Ambulatory Visit (HOSPITAL_COMMUNITY)
Admission: RE | Admit: 2023-12-02 | Discharge: 2023-12-02 | Disposition: A | Discharge status: Home / Self Care | Source: Ambulatory Visit | Attending: Emergency Medicine | Admitting: Emergency Medicine

## 2023-12-02 DIAGNOSIS — I6523 Occlusion and stenosis of bilateral carotid arteries: Secondary | ICD-10-CM | POA: Diagnosis present

## 2024-01-08 ENCOUNTER — Encounter (HOSPITAL_BASED_OUTPATIENT_CLINIC_OR_DEPARTMENT_OTHER): Payer: Self-pay

## 2024-01-08 ENCOUNTER — Emergency Department (HOSPITAL_BASED_OUTPATIENT_CLINIC_OR_DEPARTMENT_OTHER)
Admission: EM | Admit: 2024-01-08 | Discharge: 2024-01-08 | Disposition: A | Discharge status: Home / Self Care | Attending: Emergency Medicine | Admitting: Emergency Medicine

## 2024-01-08 ENCOUNTER — Emergency Department (HOSPITAL_BASED_OUTPATIENT_CLINIC_OR_DEPARTMENT_OTHER)

## 2024-01-08 ENCOUNTER — Other Ambulatory Visit: Payer: Self-pay

## 2024-01-08 DIAGNOSIS — Z91040 Latex allergy status: Secondary | ICD-10-CM | POA: Diagnosis not present

## 2024-01-08 DIAGNOSIS — S40011A Contusion of right shoulder, initial encounter: Secondary | ICD-10-CM

## 2024-01-08 DIAGNOSIS — M25511 Pain in right shoulder: Secondary | ICD-10-CM | POA: Diagnosis present

## 2024-01-08 DIAGNOSIS — W1849XA Other slipping, tripping and stumbling without falling, initial encounter: Secondary | ICD-10-CM | POA: Diagnosis not present

## 2024-01-08 DIAGNOSIS — S43401A Unspecified sprain of right shoulder joint, initial encounter: Secondary | ICD-10-CM | POA: Diagnosis not present

## 2024-01-08 MED ORDER — NAPROXEN 500 MG PO TABS: 500.0000 mg | ORAL_TABLET | Freq: Two times a day (BID) | ORAL | 0 refills | Status: DC

## 2024-01-08 MED ORDER — KETOROLAC TROMETHAMINE 15 MG/ML IJ SOLN
15.0000 mg | Freq: Once | INTRAMUSCULAR | Status: AC
  Administered 2024-01-08: 15 mg via INTRAMUSCULAR
  Filled 2024-01-08: qty 1
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
## 2024-01-08 NOTE — Discharge Instructions (Signed)
 Continue to take medications as prescribed, and follow-up with primary care and/or orthopedics as referred, you can wear the shoulder sling throughout the day for at least the next week, but as discussed please were sure to do some range of motion exercises throughout the day to ensure your shoulder does not completely stiffen.

## 2024-01-08 NOTE — ED Notes (Signed)
 Patient transported to X-ray
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
## 2024-02-29 ENCOUNTER — Encounter: Payer: Self-pay | Admitting: Cardiovascular Disease

## 2024-02-29 ENCOUNTER — Ambulatory Visit: Attending: Cardiovascular Disease | Admitting: Cardiovascular Disease

## 2024-02-29 VITALS — BP 138/88 | HR 63 | Ht 60.0 in | Wt 138.1 lb

## 2024-02-29 DIAGNOSIS — I10 Essential (primary) hypertension: Secondary | ICD-10-CM

## 2024-02-29 DIAGNOSIS — I701 Atherosclerosis of renal artery: Secondary | ICD-10-CM

## 2024-02-29 DIAGNOSIS — E785 Hyperlipidemia, unspecified: Secondary | ICD-10-CM

## 2024-02-29 DIAGNOSIS — R55 Syncope and collapse: Secondary | ICD-10-CM

## 2024-02-29 DIAGNOSIS — Z8673 Personal history of transient ischemic attack (TIA), and cerebral infarction without residual deficits: Secondary | ICD-10-CM

## 2024-02-29 NOTE — Progress Notes (Signed)
 Patient ID: Sheri Perkins, female   DOB: [birth year], 63 y.o.   MRN: [id number]     Cardiology Office Note    Date:  02/29/2024   ID:  Sheri Perkins, DOB 21-Jan-1961, MRN [id number]  PCP:  Sabas Norleen PARAS., MD  Cardiologist:   Jerel Balding, MD   No chief complaint on file.   History of Present Illness:  Sheri Perkins is a 63 y.o. female with a history of a 60-99% right renal artery stenosis, presumed secondary to fibromuscular dysplasia, neurally mediated syncope, previous smoking and mild hyperlipidemia.  She recently saw Lum Louis, NP in clinic and her blood pressure was a little high.  After adding amlodipine  her blood pressure is much better controlled.  Recheck blood pressure today was 131/72.  She occasionally sees systolics around 160, but does not always wait to be fully relaxed before checking her blood pressure.  She had a repeat carotid duplex ultrasound that shows no progression of the mild plaque that is present bilaterally (1-39% stenosis) and she also underwent repeat renal artery duplex ultrasound that did not show any significant stenosis.  Her most recent LDL cholesterol was acceptable at 75, close to target range less than 70, but she has a chronically low HDL that was only 37.  Otherwise normal labs including normal renal function.  She had a recent fall when she tripped over her dog, but has not had recent syncopal events.  She is taking care of her husband who is recovering from brain surgery.  Her last episode of severe syncope on November 09, 2020, when she had a minor head injury, she has had 2 more episodes of brief syncope.  One of them occurred during the hot summer day by the pool when she was sitting down, that occurred at home standing up.  On both occasions she had a typical prodrome warning signs dizziness and flushing sensation.  Both events were brief and she recovered fully after lying down.  She has had syncopal events in the past when seeing  the needle or blood.    She has had to take early retirement due to joint problems.  She is taking meloxicam  daily and recently took prednisone for finger stiffness.  The patient specifically denies any chest pain at rest or with exertion, dyspnea at rest or with exertion, orthopnea, paroxysmal nocturnal dyspnea, syncope, palpitations, focal neurological deficits, intermittent claudication, lower extremity edema, unexplained weight gain, cough, hemoptysis or wheezing.   In 2019 she had an episode suggestive of right hemispheric TIA (numbness and clumsiness in the left upper extremity and left side of her face), no recurrence in the last 5 years. Carotid ultrasonography did not show any evidence of obstruction.  She is taking clopidogrel  and aspirin .  MRI brain June 2020 was normal.  She has an aortic ejection murmur.  Echo on March 2019 showed aortic valve sclerosis without stenosis.  She had an arrhythmia monitor that did not show atrial fibrillation..  Renal function is normal with a creatinine of 0.88 on 07/21/2021.   Past Medical History:  Diagnosis Date   Acute bronchiolitis    Allergy    Arthritis    fingers , back, neck    Hyperlipidemia    Renal artery stenosis 04/03/2011   right 1-59% proximal reduction,left main 1-59% reduction,   Renovascular hypertension    TIA (transient ischemic attack)    Tobacco abuse     Past Surgical History:  Procedure Laterality Date   arm surgery  Bilateral    bilateral carpal tunnel surgery     CARPAL TUNNEL RELEASE Bilateral    COLONOSCOPY     FL INJ LEFT KNEE CT ARTHROGRAM ([facility] HX)     HAND SURGERY     INCISION AND DRAINAGE PERIRECTAL ABSCESS N/A 06/11/2020   Procedure: IRRIGATION AND DEBRIDEMENT PERIRECTAL ABSCESS LYTHOTOMY;  Surgeon: Ebbie Cough, MD;  Location: [facility];  Service: General;  Laterality: N/A;   KNEE SURGERY     POLYPECTOMY     SHOULDER SURGERY Left    surgery to right thumb     ulnar nerve relocation bilaterally     US   ECHOCARDIOGRAPHY  06/16/2010   normal    Outpatient Medications Prior to Visit  Medication Sig Dispense Refill   amLODipine  (NORVASC ) 5 MG tablet Take 1 tablet (5 mg total) by mouth daily. 90 tablet 3   atenolol  (TENORMIN ) 25 MG tablet Take 1 tablet (25 mg total) by mouth daily. 90 tablet 3   atorvastatin  (LIPITOR) 40 MG tablet Take 1 tablet (40 mg total) by mouth daily. 90 tablet 3   clopidogrel  (PLAVIX ) 75 MG tablet TAKE 1 TABLET BY MOUTH ONCE DAILY **MUST  KEEP  SCHEDULED  APPOINTMENT  FOR  FURTHER  REFILLS** 30 tablet 10   naproxen  (NAPROSYN ) 500 MG tablet Take 1 tablet (500 mg total) by mouth 2 (two) times daily. 30 tablet 0   No facility-administered medications prior to visit.     Allergies:   Macrobid [nitrofurantoin macrocrystal], Nitrofurantoin, Latex, and Sulfa antibiotics   Social History   Socioeconomic History   Marital status: Married    Spouse name: Not on file   Number of children: Not on file   Years of education: Not on file   Highest education level: Not on file  Occupational History   Not on file  Tobacco Use   Smoking status: Every Day    Current packs/day: 0.00    Types: Cigarettes    Last attempt to quit: 04/03/2017    Years since quitting: 6.9   Smokeless tobacco: Never   Tobacco comments:    02/29/2024 Patient smokes about a pack of cigarettes daily    11/26/2021 Patient smokes cigarettes occassionally with a pack lasting about two weeks.  Vaping Use   Vaping status: Never Used  Substance and Sexual Activity   Alcohol use: No    Alcohol/week: 0.0 standard drinks of alcohol   Drug use: No   Sexual activity: Not on file  Other Topics Concern   Not on file  Social History Narrative   Not on file   Social Drivers of Health   Financial Resource Strain: Not on file  Food Insecurity: Not on file  Transportation Needs: Not on file  Physical Activity: Not on file  Stress: Not on file  Social Connections: Not on file     Family History:  The  patient's mother has coronary artery disease status post stent, obstructive sleep apnea, hyperlipidemia  ROS:   Please see the history of present illness.    ROS  All other systems are reviewed and are negative.   PHYSICAL EXAM:   VS:  BP 138/88 (BP Location: Left Arm, Patient Position: Sitting, Cuff Size: Normal)   Pulse 63   Ht 5' (1.524 m)   Wt 138 lb 1.6 oz (62.6 kg)   SpO2 99%   BMI 26.97 kg/m       General: Alert, oriented x3, no distress, appears well Head: no evidence of trauma, PERRL,  EOMI, no exophtalmos or lid lag, no myxedema, no xanthelasma; normal ears, nose and oropharynx Neck: normal jugular venous pulsations and no hepatojugular reflux; brisk carotid pulses without delay and no carotid bruits Chest: clear to auscultation, no signs of consolidation by percussion or palpation, normal fremitus, symmetrical and full respiratory excursions Cardiovascular: normal position and quality of the apical impulse, regular rhythm, normal first and second heart sounds, faint systolic ejection murmur, no diastolic murmurs, rubs or gallops Abdomen: no tenderness or distention, no masses by palpation, no abnormal pulsatility or arterial bruits, normal bowel sounds, no hepatosplenomegaly Extremities: no clubbing, cyanosis or edema; 2+ radial, ulnar and brachial pulses bilaterally; 2+ right femoral, posterior tibial and dorsalis pedis pulses; 2+ left femoral, posterior tibial and dorsalis pedis pulses; no subclavian or femoral bruits Neurological: grossly nonfocal Psych: Normal mood and affect     Wt Readings from Last 3 Encounters:  02/29/24 138 lb 1.6 oz (62.6 kg)  11/24/23 137 lb 3.2 oz (62.2 kg)  10/12/23 144 lb (65.3 kg)      Studies/Labs Reviewed:   EKG: Not performed today.  Personally reviewed the ECG tracing from 01/08/2024 which shows sinus rhythm, early RS transition in the precordial leads.  Recent Labs:  Lipid Panel     Component Value Date/Time   CHOL 126  11/24/2023 0858   TRIG 67 11/24/2023 0858   HDL 37 (L) 11/24/2023 0858   CHOLHDL 3.4 11/24/2023 0858   CHOLHDL 4.1 05/10/2015 0910   VLDL 15 05/10/2015 0910   LDLCALC 75 11/24/2023 0858   BMET    Component Value Date/Time   NA 146 (H) 11/24/2023 0858   K 4.1 11/24/2023 0858   CL 109 (H) 11/24/2023 0858   CO2 20 11/24/2023 0858   GLUCOSE 93 11/24/2023 0858   GLUCOSE 97 11/17/2020 1512   BUN 7 (L) 11/24/2023 0858   CREATININE 0.69 11/24/2023 0858   CREATININE 0.64 05/10/2015 0910   CALCIUM  9.4 11/24/2023 0858   GFRNONAA >60 11/17/2020 1512   GFRAA 112 01/28/2018 0946   06/19/2021 Cholesterol 191, HDL 44, LDL 132, triglycerides 80, hemoglobin 13.7  07/21/2021 Creatinine 0.88, ALT 21  ASSESSMENT:    1. Neurocardiogenic syncope   2. Essential hypertension   3. History of TIA (transient ischemic attack)   4. Renal artery stenosis   5. Dyslipidemia (high LDL; low HDL)        PLAN:  In order of problems listed above:  Neurally mediated syncope: No episodes have occurred recently.  Condition is lifelong, sometimes with typical triggers, sometimes unclear why it occurs.  Reminded her to stay well-hydrated.  Avoid standing still without moving for long periods of time.  Avoid known triggers.  Unfortunately cannot recommend a salt rich diet due to her blood pressure. HBP: now well-controlled on a combination of atenolol  and amlodipine .  No side effects to report. TIA: No recent focal neurological events.   Continue Plavix . Renal artery stenosis/carotid atherosclerosis: Renal artery stenosis not seen on the most recent evaluation.  Normal renal function.  Carotid duplex shows no severe stenoses by recent ultrasound. HLP: On statin, not far from target LDL less than 70.  Markedly improved from previous values.  Has a chronically low HDL.  Encouraged more physical exercise at minimum 2.5 hours a week.    Medication Adjustments/Labs and Tests Ordered: Current medicines are  reviewed at length with the patient today.  Concerns regarding medicines are outlined above.  Medication changes, Labs and Tests ordered today are listed in the  Patient Instructions below. Patient Instructions  Medication Instructions:  No changes *If you need a refill on your cardiac medications before your next appointment, please call your pharmacy*  Lab Work: None ordered If you have labs (blood work) drawn today and your tests are completely normal, you will receive your results only by: MyChart Message (if you have MyChart) OR A paper copy in the mail If you have any lab test that is abnormal or we need to change your treatment, we will call you to review the results.  Testing/Procedures: None ordered  Follow-Up: At [facility], you and your health needs are our priority.  As part of our continuing mission to provide you with exceptional heart care, our providers are all part of one team.  This team includes your primary Cardiologist (physician) and Advanced Practice Providers or APPs (Physician Assistants and Nurse Practitioners) who all work together to provide you with the care you need, when you need it.  Your next appointment:   1 year(s)  Provider:   Jerel Balding, MD    We recommend signing up for the patient portal called MyChart.  Sign up information is provided on this After Visit Summary.  MyChart is used to connect with patients for Virtual Visits (Telemedicine).  Patients are able to view lab/test results, encounter notes, upcoming appointments, etc.  Non-urgent messages can be sent to your provider as well.   To learn more about what you can do with MyChart, go to [email or web address].           Signed, Jerel Balding, MD  02/29/2024 8:59 AM    [facility] [address] [place], [place], [place]  [address] Phone: [phone]; Fax: [phone]

## 2024-02-29 NOTE — Patient Instructions (Signed)

## 2024-05-30 ENCOUNTER — Ambulatory Visit: Payer: Self-pay | Admitting: Family
